# Patient Record
Sex: Male | Born: 1960
Health system: Southern US, Community
[De-identification: ages and names within clinical notes are randomized; demographics above are authoritative.]

## PROBLEM LIST (undated history)

## (undated) DIAGNOSIS — D179 Benign lipomatous neoplasm, unspecified: Secondary | ICD-10-CM

## (undated) DIAGNOSIS — R7303 Prediabetes: Secondary | ICD-10-CM

## (undated) DIAGNOSIS — I10 Essential (primary) hypertension: Secondary | ICD-10-CM

## (undated) DIAGNOSIS — S83249A Other tear of medial meniscus, current injury, unspecified knee, initial encounter: Secondary | ICD-10-CM

## (undated) DIAGNOSIS — E785 Hyperlipidemia, unspecified: Secondary | ICD-10-CM

## (undated) DIAGNOSIS — C61 Malignant neoplasm of prostate: Secondary | ICD-10-CM

## (undated) DIAGNOSIS — N401 Enlarged prostate with lower urinary tract symptoms: Secondary | ICD-10-CM

## (undated) DIAGNOSIS — C801 Malignant (primary) neoplasm, unspecified: Secondary | ICD-10-CM

## (undated) HISTORY — PX: PROSTATE BIOPSY: SHX241

## (undated) HISTORY — PX: NO PAST SURGERIES: SHX2092

## (undated) HISTORY — DX: Malignant (primary) neoplasm, unspecified: C80.1

---

## 2002-02-19 ENCOUNTER — Inpatient Hospital Stay (HOSPITAL_COMMUNITY): Admission: EM | Admit: 2002-02-19 | Discharge: 2002-02-19 | Payer: Self-pay | Admitting: Emergency Medicine

## 2003-04-17 ENCOUNTER — Emergency Department (HOSPITAL_COMMUNITY): Admission: EM | Admit: 2003-04-17 | Discharge: 2003-04-17 | Payer: Self-pay | Admitting: Emergency Medicine

## 2003-10-20 ENCOUNTER — Emergency Department (HOSPITAL_COMMUNITY): Admission: EM | Admit: 2003-10-20 | Discharge: 2003-10-20 | Payer: Self-pay | Admitting: Emergency Medicine

## 2007-07-22 ENCOUNTER — Emergency Department (HOSPITAL_COMMUNITY): Admission: EM | Admit: 2007-07-22 | Discharge: 2007-07-23 | Payer: Self-pay | Admitting: Emergency Medicine

## 2009-01-21 ENCOUNTER — Emergency Department (HOSPITAL_COMMUNITY): Admission: EM | Admit: 2009-01-21 | Discharge: 2009-01-21 | Payer: Self-pay | Admitting: Emergency Medicine

## 2009-02-27 ENCOUNTER — Emergency Department (HOSPITAL_COMMUNITY): Admission: EM | Admit: 2009-02-27 | Discharge: 2009-02-27 | Payer: Self-pay | Admitting: Emergency Medicine

## 2009-02-28 ENCOUNTER — Emergency Department (HOSPITAL_COMMUNITY): Admission: EM | Admit: 2009-02-28 | Discharge: 2009-02-28 | Payer: Self-pay | Admitting: Emergency Medicine

## 2009-04-14 ENCOUNTER — Emergency Department (HOSPITAL_COMMUNITY): Admission: EM | Admit: 2009-04-14 | Discharge: 2009-04-14 | Payer: Self-pay | Admitting: Emergency Medicine

## 2009-05-24 ENCOUNTER — Emergency Department (HOSPITAL_COMMUNITY): Admission: EM | Admit: 2009-05-24 | Discharge: 2009-05-24 | Payer: Self-pay | Admitting: Emergency Medicine

## 2009-06-01 ENCOUNTER — Emergency Department (HOSPITAL_COMMUNITY): Admission: EM | Admit: 2009-06-01 | Discharge: 2009-06-01 | Payer: Self-pay | Admitting: Emergency Medicine

## 2010-12-30 LAB — URINALYSIS, ROUTINE W REFLEX MICROSCOPIC
Hgb urine dipstick: NEGATIVE
Ketones, ur: NEGATIVE mg/dL
Nitrite: NEGATIVE
Protein, ur: NEGATIVE mg/dL
Specific Gravity, Urine: 1.022 (ref 1.005–1.030)
Urobilinogen, UA: 1 mg/dL (ref 0.0–1.0)
pH: 5.5 (ref 5.0–8.0)

## 2010-12-30 LAB — GLUCOSE, CAPILLARY

## 2010-12-30 LAB — POCT I-STAT, CHEM 8
Calcium, Ion: 1.04 mmol/L — ABNORMAL LOW (ref 1.12–1.32)
Creatinine, Ser: 1.1 mg/dL (ref 0.4–1.5)
HCT: 36 % — ABNORMAL LOW (ref 39.0–52.0)

## 2011-07-03 LAB — DIFFERENTIAL
Basophils Relative: 1
Eosinophils Absolute: 0.3
Eosinophils Relative: 3
Lymphocytes Relative: 36
Monocytes Relative: 9
Neutrophils Relative %: 51

## 2011-07-03 LAB — CBC
Hemoglobin: 14.9
MCHC: 34.8
MCV: 92.2
Platelets: 267
RBC: 4.64
RDW: 12.4
WBC: 7.9

## 2011-07-03 LAB — URINALYSIS, ROUTINE W REFLEX MICROSCOPIC
Hgb urine dipstick: NEGATIVE
Ketones, ur: NEGATIVE
Protein, ur: NEGATIVE
Specific Gravity, Urine: 1.027
Urobilinogen, UA: 0.2

## 2011-07-03 LAB — I-STAT 8, (EC8 V) (CONVERTED LAB)
BUN: 22
Chloride: 109
HCT: 46
Hemoglobin: 15.6
Operator id: 294341
Sodium: 137
TCO2: 26
pCO2, Ven: 43.3 — ABNORMAL LOW

## 2011-07-03 LAB — POCT I-STAT CREATININE
Creatinine, Ser: 1.5
Operator id: 294341

## 2011-07-03 LAB — URINE MICROSCOPIC-ADD ON

## 2015-04-26 ENCOUNTER — Encounter (HOSPITAL_COMMUNITY): Payer: Self-pay | Admitting: Cardiology

## 2015-04-26 ENCOUNTER — Emergency Department (HOSPITAL_COMMUNITY)
Admission: EM | Admit: 2015-04-26 | Discharge: 2015-04-26 | Disposition: A | Discharge status: Home / Self Care | Payer: Self-pay | Attending: Emergency Medicine | Admitting: Emergency Medicine

## 2015-04-26 DIAGNOSIS — R229 Localized swelling, mass and lump, unspecified: Secondary | ICD-10-CM

## 2015-04-26 DIAGNOSIS — Z720 Tobacco use: Secondary | ICD-10-CM | POA: Insufficient documentation

## 2015-04-26 DIAGNOSIS — R2242 Localized swelling, mass and lump, left lower limb: Secondary | ICD-10-CM | POA: Insufficient documentation

## 2015-04-26 NOTE — ED Provider Notes (Signed)
History  This chart was scribed for non-physician practitioner, Monico Blitz, PA-C,working with Evelina Bucy, MD, by Marlowe Kays, ED Scribe. This patient was seen in room TR11C/TR11C and the patient's care was started at 2:22 PM.  Chief Complaint  Patient presents with  . Abscess    The history is provided by the patient and medical records. No language interpreter was used.    HPI Comments:  Randy Hansen is a 54 y.o. male who presents to the Emergency Department complaining of a cyst to the left lateral upper thigh that has been slowly growing over the past 10 years. He states his family has these same type of cysts. He reports mild intermittent aching of the area. He denies modifying factors. He denies fever, chills, warmth, redness, nausea or vomiting.  History reviewed. No pertinent past medical history. History reviewed. No pertinent past surgical history. History reviewed. No pertinent family history. History  Substance Use Topics  . Smoking status: Current Every Day Smoker  . Smokeless tobacco: Not on file  . Alcohol Use: Yes    Review of Systems A complete 10 system review of systems was obtained and all systems are negative except as noted in the HPI and PMH.   Allergies  Review of patient's allergies indicates no known allergies.  Home Medications   Prior to Admission medications   Not on File   Triage Vitals: BP 112/67 mmHg  Pulse 48  Temp(Src) 98 F (36.7 C) (Oral)  Resp 18  Wt 107 lb 14.4 oz (48.943 kg)  SpO2 100% Physical Exam  Constitutional: He is oriented to person, place, and time. He appears well-developed and well-nourished.  HENT:  Head: Normocephalic and atraumatic.  Eyes: EOM are normal.  Neck: Normal range of motion.  Cardiovascular: Normal rate.   Pulmonary/Chest: Effort normal.  Musculoskeletal: Normal range of motion. He exhibits no tenderness.  7 x 5 cm minimally mobile, nontender lesion to left anterolateral hip. No  overlying cellulitis or warmth.  Neurological: He is alert and oriented to person, place, and time.  Skin: Skin is warm and dry.  Psychiatric: He has a normal mood and affect. His behavior is normal.  Nursing note and vitals reviewed.   ED Course  Procedures (including critical care time) DIAGNOSTIC STUDIES: Oxygen Saturation is 100% on RA, normal by my interpretation.   COORDINATION OF CARE: 2:24 PM- Will give referral to [facility]. Pt verbalizes understanding and agrees to plan.  Medications - No data to display  Labs Review Labs Reviewed - No data to display  Imaging Review No results found.   EKG Interpretation None      MDM   Final diagnoses:  Localized skin mass, lump, or swelling    Filed Vitals:   04/26/15 1328 04/26/15 1332 04/26/15 1450  BP: 146/88 112/67 148/89  Pulse: 87 48 83  Temp: 98.3 F (36.8 C) 98 F (36.7 C)   TempSrc: Oral Oral   Resp: 18 18 18   Weight:  107 lb 14.4 oz (48.943 kg)   SpO2: 96% 100% 98%    Randy Hansen is a pleasant 54 y.o. male presenting with mass to left hip, no overlying skin changes states that his family members all have similar. This does not appear to be an abscess. Patient given general surgery referral for evaluation   Evaluation does not show pathology that would require ongoing emergent intervention or inpatient treatment. Pt is hemodynamically stable and mentating appropriately. Discussed findings and plan with patient/guardian, who  agrees with care plan. All questions answered. Return precautions discussed and outpatient follow up given.    I personally performed the services described in this documentation, which was scribed in my presence. The recorded information has been reviewed and is accurate.    Monico Blitz, PA-C 04/26/15 2231  Evelina Bucy, MD 04/28/15 2232

## 2015-04-26 NOTE — ED Notes (Signed)
Pt reports a large abscess on his left hip that has been there for a couple of years. States he does not want the area I&D. Pt states he just wants to know if he can get some antibiotics.

## 2015-04-26 NOTE — Discharge Instructions (Signed)
Do not hesitate to return to the emergency room for any new, worsening or concerning symptoms. ° °Please obtain primary care using resource guide below. Let them know that you were seen in the emergency room and that they will need to obtain records for further outpatient management. ° ° ° °Emergency Department Resource Guide °1) Find a Doctor and Pay Out of Pocket °Although you won't have to find out who is covered by your insurance plan, it is a good idea to ask around and get recommendations. You will then need to call the office and see if the doctor you have chosen will accept you as a new patient and what types of options they offer for patients who are self-pay. Some doctors offer discounts or will set up payment plans for their patients who do not have insurance, but you will need to ask so you aren't surprised when you get to your appointment. ° °2) Contact Your Local Health Department °Not all health departments have doctors that can see patients for sick visits, but many do, so it is worth a call to see if yours does. If you don't know where your local health department is, you can check in your phone book. The CDC also has a tool to help you locate your state's health department, and many state websites also have listings of all of their local health departments. ° °3) Find a Walk-in Clinic °If your illness is not likely to be very severe or complicated, you may want to try a walk in clinic. These are popping up all over the country in pharmacies, drugstores, and shopping centers. They're usually staffed by nurse practitioners or physician assistants that have been trained to treat common illnesses and complaints. They're usually fairly quick and inexpensive. However, if you have serious medical issues or chronic medical problems, these are probably not your best option. ° °No Primary Care Doctor: °- Call [facility] at  832-8000 - they can help you locate a primary care doctor that  accepts your  insurance, provides certain services, etc. °- Physician Referral Service- 1-800-533-3463 ° °Chronic Pain Problems: °Organization         Address  Phone   Notes  °[facility]  (336) 297-2271 Patients need to be referred by their primary care doctor.  ° °Medication Assistance: °Organization         Address  Phone   Notes  °[place] [facility] [address] °[place], [place] [address] (336) 641-8030 --Must be a resident of [place] °-- Must have NO insurance coverage whatsoever (no Medicaid/ Medicare, etc.) °-- The pt. MUST have a primary care doctor that directs their care regularly and follows them in the community °  °[facility]  (866) 331-1348   °[employer]  (888) 892-1162   ° °Agencies that provide inexpensive medical care: °Organization         Address  Phone   Notes  °[facility]  (336) 832-8035   °[facility]    (336) 832-7272   °[facility] [address] °[place], [place] [address] (336) 832-4777   °[facility] [address], °[place] (336) 271-4999   °[facility]    (336) 373-0678   °[facility]    (336) 272-1050   °[facility] ° [address], [place] Phone:  (336) 832-4444, Fax:  (336) 832-4440 Hours of Operation:  9 am -   6 pm, M-F.  Also accepts Medicaid/Medicare and self-pay.  °[facility] ° [address], [place] Phone: (336) 832-3150, Fax: (336) 832-3151. Hours of Operation:  8:30 am - 5:30 pm, M-F.  Also accepts Medicaid and self-pay.  °[facility] [place] [address], [place] Phone: (336) 878-6027   °[facility] [address], [place], [place] (336)723-1848, Ext. 123 Mondays & Thursdays: 7-9 AM.  First 15 patients are seen on a first come, first serve basis. °  ° °Medicaid-accepting [place] Providers: ° °Organization          Address  Phone   Notes  °[facility] [address], [place] (336) 641-2100 Also accepts self-pay patients.  °[facility] [address], [place] ° (336) 856-9996   °[facility] [address], [place] (336) 288-8857   °[facility] [address] [place] [address], [place] (336) 299-7000   °Veita Bland [address], [place]  ° (336) 373-1557 Only accepts  Access Medicaid patients after they have their name applied to their card.  ° °Self-Pay (no insurance) in [place]: ° °Organization         Address  Phone   Notes  °Sickle Cell Patients, [facility] [address], [place] (336) 832-1970   °[facility] [address], [place] (336) 832-4400   °[facility] ° [address], [place] (336) 992-4800   °[facility]/Dr. Osei-Bonsu ° [address] [place] [address], [place] or [address], [place] (336) 841-8500 Phone number for both [place] and [place] locations is the same.  °[facility] [address], [place] (336) 299-0000   °[facility] [address] [place] [address], [place] or [address] (336) 852-7530 °(336) 878-2260   °[facility] [address], [place] (336) 350-1642, phone; (336) 294-5005, fax Sees patients 1st and 3rd Saturday of every month.  Must not qualify for public or private insurance (i.e. Medicaid, Medicare, [place] Health Choice, Veterans' Benefits) • Household income should be no more than 200% of the poverty level •The clinic cannot treat you if you are pregnant or think you are pregnant • Sexually transmitted diseases are not treated at the clinic.  ° ° °Dental Care: °Organization         Address  Phone  Notes  °[facility] [address],  [place] (336) 641-6152 Accepts children up to age 21 who are enrolled in Medicaid or [place] Health Choice; pregnant women with a Medicaid card; and children who have applied for Medicaid or [place] Health Choice, but were declined, whose parents can pay a reduced fee at time of service.  °[facility] [place]  [address], [place] (336) 641-7733 Accepts children up to age 21 who are enrolled in Medicaid or [place] Health Choice; pregnant women with a Medicaid card; and children who have applied for Medicaid or [place] Health Choice, but were declined, whose parents can pay a reduced fee at time of service.  °[facility] ° [address], [place] (336) 641-4533 Patients are seen by appointment only. Walk-ins are not accepted. [facility] will see patients 18 years of age and   older. °Monday - Tuesday (8am-5pm) °Most Wednesdays (8:30-5pm) °$30 per visit, cash only  °[facility] ° [address], [place] (336) 641-4533 Patients are seen by appointment only. Walk-ins are not accepted. [facility] will see patients 18 years of age and older. °One Wednesday Evening (Monthly: Volunteer Based).  $30 per visit, cash only  °[facility]  (919) 537-3737 for adults; Children under age 4, call Graduate Pediatric Dentistry at (919) [phone]. Children aged 4-14, please call (919) [phone] to request a pediatric application. ° Dental services are provided in all areas of dental care including fillings, crowns and bridges, complete and partial dentures, implants, gum treatment, root canals, and extractions. Preventive care is also provided. Treatment is provided to both adults and children. °Patients are selected via a lottery and there is often a waiting list. °  °[facility] [address], °[place] ° (336) 763-8833 [email or web address] °  °[facility] [address], [place], [place]  (336)723-1848, Ext. 123 Second and Fourth Thursday of each month, opens at 6:30 AM; Clinic ends at 9 AM.  Patients are seen on a first-come first-served basis, and a limited number are seen during each clinic.  ° °[facility] ° [address], [place], [place] (336) 723-7904   Eligibility Requirements °You must have lived in [place], [place], or [place] counties for at least the last three months. °  You cannot be eligible for state or federal sponsored healthcare insurance, including Veterans Administration, Medicaid, or Medicare. °  You generally cannot be eligible for healthcare insurance through your employer.  °  How to apply: °Eligibility screenings are held every Tuesday and Wednesday afternoon from 1:00 pm until 4:00 pm. You do not need an appointment for the interview!  °[facility] [address], [place], [place] 336-631-2330   °[facility]  336-342-8273   °[facility]  336-703-3100   °[facility]  336-570-6415   ° °Behavioral Health Resources in the Community: °Intensive Outpatient Programs °Organization         Address  Phone  Notes  °[facility] [address], [place], [place] 336-878-6098   °[facility] [address], [place], [place] 336-832-9800   °[facility] [address], [place], [place] ° 336-882-2125   °[facility] [address],  °[place], [place] 1-800-853-5163 or 336-641-4981   °Substance Abuse Resources °Organization         Address  Phone  Notes  °[facility]  336-882-2125   °[facility]  336-784-9470   °[facility]  336-285-9073   °[facility]  336-845-3988   °Residential & Outpatient Substance Abuse Program  1-800-659-3381   °Psychological Services °Organization         Address  Phone  Notes  °[facility]  336- 832-9600   °[facility]  336- 378-7881    °[facility] [address], [place] 1-800-853-5163 or 336-641-4981   ° °Mobile Crisis Teams °Organization         Address  Phone  Notes  °[facility], Mobile Crisis Care Unit  1-877-626-1772   °Assertive °[facility] ° [address] [place], [place] 336-834-9664   °Sharon DeEsch [address] °[place] 336-554-5454   ° °Self-Help/Support Groups °Organization         Address    Phone             Notes  °[facility] - variety of support groups  336- 373-1402 Call for more information  °Narcotics Anonymous (NA), [facility] [address], °[place]  2 meetings at this location  ° °Residential Treatment Programs °Organization         Address  Phone  Notes  °[facility] [address],    °[place]  1-866-801-8205   °[facility] ° [address], [place], [place] 704-293-8524   °[facility] [address], [place] 336-845-3988 Admissions: 8am-3pm M-F  °[facility] [address]    °[place], [place] 336-841-1104   °[facility] [address], [place], [place] 336-379-7146   °[facility] [address]  °[place], [place] 336-285-9073   °[facility] - Intensive Outpatient [address], [place], [place] 336-852-3033   °[facility] [address]  °[place], [place] 1-877-615-2722 or 336-784-9470   °[facility] [address] [place], [place] 336-227-7417 Accepts Medicaid  °[facility] [address]  °[place] 1-800-659-3381 Substance Abuse/Addiction Treatment  ° °[place] Behavioral Health Resources °Organization         Address  Phone  Notes  °[facility]  (888) 581-9988   °Julie Brannon, PhD [address] [place], [place]   (336) 349-5553 or (336) 951-0000   °[facility]   [address] °[place], [place] (336) 349-4454   °[facility] [address], [place], [place] (336) 342-8316 Insurance/Medicaid/sponsorship through [facility]  °[facility] [address]                                    [place], [place] (336) 342-8316 Therapy/tele-psych/case  °[facility] [address]  ° [place], [place] (336) 349-2233    °Dr. Arfeen  (336) 349-4544   °[facility] [place]  [employer] [place] [facility] 1) [address], [place] °2) [address], [place] °3)  [address], [place] (336) 349-3220 °(336) 342-7768 ° °(336) 342-8140   °[place] [facility] (336) 342-1394 or (336) 342-3537 (After Hours)    ° ° ° °

## 2015-06-05 ENCOUNTER — Encounter (HOSPITAL_COMMUNITY): Payer: Self-pay | Admitting: *Deleted

## 2015-06-05 ENCOUNTER — Emergency Department (HOSPITAL_COMMUNITY)
Admission: EM | Admit: 2015-06-05 | Discharge: 2015-06-05 | Disposition: A | Discharge status: Home / Self Care | Payer: 59 | Attending: Emergency Medicine | Admitting: Emergency Medicine

## 2015-06-05 DIAGNOSIS — R0989 Other specified symptoms and signs involving the circulatory and respiratory systems: Secondary | ICD-10-CM

## 2015-06-05 DIAGNOSIS — R222 Localized swelling, mass and lump, trunk: Secondary | ICD-10-CM | POA: Insufficient documentation

## 2015-06-05 DIAGNOSIS — R0683 Snoring: Secondary | ICD-10-CM | POA: Diagnosis not present

## 2015-06-05 DIAGNOSIS — R202 Paresthesia of skin: Secondary | ICD-10-CM | POA: Diagnosis not present

## 2015-06-05 DIAGNOSIS — Z720 Tobacco use: Secondary | ICD-10-CM | POA: Diagnosis not present

## 2015-06-05 NOTE — ED Provider Notes (Signed)
CSN: [id number]     Arrival date & time 06/05/15  1353 History   First MD Initiated Contact with Patient 06/05/15 1924     Chief Complaint  Patient presents with  . Mass     (Consider location/radiation/quality/duration/timing/severity/associated sxs/prior Treatment) HPI Patient's initial complaint is that he was looking in the mirror last week and he stretched out his arms and his chest and he noticed a knot in the middle of his chest. He never seen that before. He is worried about it all week. (His area of concern is his xiphoid process.) No associated symptoms.  Subsequently patient reported that a lot of people's that he snores a lot when he sleeps and he seems like he is struggling for air. He wonders if that is normal or if he needs to have something else done about it. (The hand or the room the patient was sound asleep and snoring, he was on the saturation monitor and O2 saturation was 96%)  Patient went on to express concerns about right arm. He reports sometimes he thought it felt like it was tingling but not quite tingling. He states because he is 9 it concerned him. No other associated symptoms. History reviewed. No pertinent past medical history. History reviewed. No pertinent past surgical history. History reviewed. No pertinent family history. Social History  Substance Use Topics  . Smoking status: Current Every Day Smoker  . Smokeless tobacco: None  . Alcohol Use: Yes    Review of Systems  10 Systems reviewed and are negative for acute change except as noted in the HPI.   Allergies  Review of patient's allergies indicates no known allergies.  Home Medications   Prior to Admission medications   Not on File   BP 132/77 mmHg  Pulse 77  Temp(Src) 98 F (36.7 C) (Oral)  Resp 18  SpO2 95% Physical Exam  Constitutional: He is oriented to person, place, and time. He appears well-developed and well-nourished.  HENT:  Head: Normocephalic and atraumatic.  Eyes:  EOM are normal. Pupils are equal, round, and reactive to light.  Neck: Neck supple.  Cardiovascular: Normal rate, regular rhythm, normal heart sounds and intact distal pulses.   Pulmonary/Chest: Effort normal and breath sounds normal. He exhibits no tenderness.  Normal chest wall with normal xiphoid process.  Abdominal: Soft. Bowel sounds are normal. He exhibits no distension. There is no tenderness.  Musculoskeletal: Normal range of motion. He exhibits no edema or tenderness.  Neurological: He is alert and oriented to person, place, and time. He has normal strength. No cranial nerve deficit. He exhibits normal muscle tone. Coordination normal. GCS eye subscore is 4. GCS verbal subscore is 5. GCS motor subscore is 6.  Skin: Skin is warm, dry and intact.  Psychiatric: He has a normal mood and affect.    ED Course  Procedures (including critical care time) Labs Review Labs Reviewed - No data to display  Imaging Review No results found. I have personally reviewed and evaluated these images and lab results as part of my medical decision-making.   EKG Interpretation None      MDM   Final diagnoses:  Primary snoring  Paresthesia  Chest wall symptom   Patient had multiple concerns. He is well in appearance with normal vital signs. No evidence of acute illness or emergent condition. Patient is counseled on appropriate outpatient follow-up.    Charlesetta Shanks, MD 06/05/15 2049

## 2015-06-05 NOTE — Discharge Instructions (Signed)
Possible Sleep Apnea  Sleep apnea is a sleep disorder characterized by abnormal pauses in breathing while you sleep. When your breathing pauses, the level of oxygen in your blood decreases. This causes you to move out of deep sleep and into light sleep. As a result, your quality of sleep is poor, and the system that carries your blood throughout your body (cardiovascular system) experiences stress. If sleep apnea remains untreated, the following conditions can develop:  High blood pressure (hypertension).  Coronary artery disease.  Inability to achieve or maintain an erection (impotence).  Impairment of your thought process (cognitive dysfunction). There are three types of sleep apnea:  Obstructive sleep apnea--Pauses in breathing during sleep because of a blocked airway.  Central sleep apnea--Pauses in breathing during sleep because the area of the brain that controls your breathing does not send the correct signals to the muscles that control breathing.  Mixed sleep apnea--A combination of both obstructive and central sleep apnea. RISK FACTORS The following risk factors can increase your risk of developing sleep apnea:  Being overweight.  Smoking.  Having narrow passages in your nose and throat.  Being of older age.  Being male.  Alcohol use.  Sedative and tranquilizer use.  Ethnicity. Among individuals younger than 35 years, African Americans are at increased risk of sleep apnea. SYMPTOMS   Difficulty staying asleep.  Daytime sleepiness and fatigue.  Loss of energy.  Irritability.  Loud, heavy snoring.  Morning headaches.  Trouble concentrating.  Forgetfulness.  Decreased interest in sex. DIAGNOSIS  In order to diagnose sleep apnea, your caregiver will perform a physical examination. Your caregiver may suggest that you take a home sleep test. Your caregiver may also recommend that you spend the night in a sleep lab. In the sleep lab, several monitors record  information about your heart, lungs, and brain while you sleep. Your leg and arm movements and blood oxygen level are also recorded. TREATMENT The following actions may help to resolve mild sleep apnea:  Sleeping on your side.   Using a decongestant if you have nasal congestion.   Avoiding the use of depressants, including alcohol, sedatives, and narcotics.   Losing weight and modifying your diet if you are overweight. There also are devices and treatments to help open your airway:  Oral appliances. These are custom-made mouthpieces that shift your lower jaw forward and slightly open your bite. This opens your airway.  Devices that create positive airway pressure. This positive pressure "splints" your airway open to help you breathe better during sleep. The following devices create positive airway pressure:  Continuous positive airway pressure (CPAP) device. The CPAP device creates a continuous level of air pressure with an air pump. The air is delivered to your airway through a mask while you sleep. This continuous pressure keeps your airway open.  Nasal expiratory positive airway pressure (EPAP) device. The EPAP device creates positive air pressure as you exhale. The device consists of single-use valves, which are inserted into each nostril and held in place by adhesive. The valves create very little resistance when you inhale but create much more resistance when you exhale. That increased resistance creates the positive airway pressure. This positive pressure while you exhale keeps your airway open, making it easier to breath when you inhale again.  Bilevel positive airway pressure (BPAP) device. The BPAP device is used mainly in patients with central sleep apnea. This device is similar to the CPAP device because it also uses an air pump to deliver continuous air  pressure through a mask. However, with the BPAP machine, the pressure is set at two different levels. The pressure when you  exhale is lower than the pressure when you inhale.  Surgery. Typically, surgery is only done if you cannot comply with less invasive treatments or if the less invasive treatments do not improve your condition. Surgery involves removing excess tissue in your airway to create a wider passage way. Document Released: 08/30/2002 Document Revised: 01/04/2013 Document Reviewed: 01/16/2012 [facility] Patient Information 2015 [place], [place]. This information is not intended to replace advice given to you by your health care provider. Make sure you discuss any questions you have with your health care provider.  Paresthesia Paresthesia is an abnormal burning or prickling sensation. This sensation is generally felt in the hands, arms, legs, or feet. However, it may occur in any part of the body. It is usually not painful. The feeling may be described as:  Tingling or numbness.  "Pins and needles."  Skin crawling.  Buzzing.  Limbs "falling asleep."  Itching. Most people experience temporary (transient) paresthesia at some time in their lives. CAUSES  Paresthesia may occur when you breathe too quickly (hyperventilation). It can also occur without any apparent cause. Commonly, paresthesia occurs when pressure is placed on a nerve. The feeling quickly goes away once the pressure is removed. For some people, however, paresthesia is a long-lasting (chronic) condition caused by an underlying disorder. The underlying disorder may be:  A traumatic, direct injury to nerves. Examples include a:  Broken (fractured) neck.  Fractured skull.  A disorder affecting the brain and spinal cord (central nervous system). Examples include:  Transverse myelitis.  Encephalitis.  Transient ischemic attack.  Multiple sclerosis.  Stroke.  Tumor or blood vessel problems, such as an arteriovenous malformation pressing against the brain or spinal cord.  A condition that damages the peripheral nerves (peripheral  neuropathy). Peripheral nerves are not part of the brain and spinal cord. These conditions include:  Diabetes.  Peripheral vascular disease.  Nerve entrapment syndromes, such as carpal tunnel syndrome.  Shingles.  Hypothyroidism.  Vitamin B12 deficiencies.  Alcoholism.  Heavy metal poisoning (lead, arsenic).  Rheumatoid arthritis.  Systemic lupus erythematosus. DIAGNOSIS  Your caregiver will attempt to find the underlying cause of your paresthesia. Your caregiver may:  Take your medical history.  Perform a physical exam.  Order various lab tests.  Order imaging tests. TREATMENT  Treatment for paresthesia depends on the underlying cause. HOME CARE INSTRUCTIONS  Avoid drinking alcohol.  You may consider massage or acupuncture to help relieve your symptoms.  Keep all follow-up appointments as directed by your caregiver. SEEK IMMEDIATE MEDICAL CARE IF:   You feel weak.  You have trouble walking or moving.  You have problems with speech or vision.  You feel confused.  You cannot control your bladder or bowel movements.  You feel numbness after an injury.  You faint.  Your burning or prickling feeling gets worse when walking.  You have pain, cramps, or dizziness.  You develop a rash. MAKE SURE YOU:  Understand these instructions.  Will watch your condition.  Will get help right away if you are not doing well or get worse. Document Released: 08/30/2002 Document Revised: 12/02/2011 Document Reviewed: 05/31/2011 [facility] Patient Information 2015 [place], [place]. This information is not intended to replace advice given to you by your health care provider. Make sure you discuss any questions you have with your health care provider.   Emergency Department Resource Guide 1) Find a Doctor  and Pay Out of Pocket Although you won't have to find out who is covered by your insurance plan, it is a good idea to ask around and get recommendations. You will then  need to call the office and see if the doctor you have chosen will accept you as a new patient and what types of options they offer for patients who are self-pay. Some doctors offer discounts or will set up payment plans for their patients who do not have insurance, but you will need to ask so you aren't surprised when you get to your appointment.  2) Contact Your Local Health Department Not all health departments have doctors that can see patients for sick visits, but many do, so it is worth a call to see if yours does. If you don't know where your local health department is, you can check in your phone book. The CDC also has a tool to help you locate your state's health department, and many state websites also have listings of all of their local health departments.  3) Find a [facility] If your illness is not likely to be very severe or complicated, you may want to try a walk in clinic. These are popping up all over the country in pharmacies, drugstores, and shopping centers. They're usually staffed by nurse practitioners or physician assistants that have been trained to treat common illnesses and complaints. They're usually fairly quick and inexpensive. However, if you have serious medical issues or chronic medical problems, these are probably not your best option.  No Primary Care Doctor: - Call Health Connect at  [phone] - they can help you locate a primary care doctor that  accepts your insurance, provides certain services, etc. - Physician Referral Service- [phone]  Chronic Pain Problems: Organization         Address  Phone   Notes  [facility]  [phone] Patients need to be referred by their primary care doctor.   Medication Assistance: Organization         Address  Phone   Notes  [facility] Medication [facility] [address] [place], [place] [address] [phone] --Must be a resident of [facility] -- Must have NO  insurance coverage whatsoever (no Medicaid/ Medicare, etc.) -- The pt. MUST have a primary care doctor that directs their care regularly and follows them in the community   MedAssist  [phone]   [employer]  [phone]    Agencies that provide inexpensive medical care: Organization         Address  Phone   Notes  [place]  [phone]   [facility]    [phone]   [place], [place] [address] [phone]   [place] [address], [place] [phone]   Planned Parenthood    [phone]   [facility]    [phone]   [place] and [address], [place] Phone:  [phone], Fax:  [phone] Hours of Operation:  9 am - 6 pm, M-F.  Also accepts Medicaid/Medicare and self-pay.  [facility] for [place], [address], [place] Phone: [phone], Fax: [phone]. Hours of Operation:  8:30 am - 5:30 pm, M-F.  Also accepts Medicaid and self-pay.  [facility] [address] [employer] Phone: [phone]  [facility] [place], [place], [place] [phone], Ext. [phone] Mondays & Thursdays: 7-9 AM.  First 15 patients are seen on a first come, first serve basis.    [place] Providers:  Organization         Address  Phone   Notes  [facility] [address], [place] [phone] Also accepts self-pay patients.  [facility] [address], [place]  [phone]   [place], [address], [place] [phone]   [facility] [address] [place] [phone]   Lucianne Lei [address], [place]   [phone] Only accepts [place] Access [place] patients after they have  their name applied to their card.   Self-Pay (no insurance) in [facility]:  Organization         Address  Phone   Notes  Sickle Cell Patients, [facility] [place] [phone]   [facility] [place] [phone]   [facility] [place]  [place], [place], [place] [phone]   [facility]/Dr. Osei-Bonsu  [address] [place] or [address], [place] [phone] Phone number for both [place] and [place] locations is the same.  [facility] [address], [place] [phone]   [facility] [address], [place] or [address] [phone]   [facility] [address], [place] [phone], phone; [phone], fax Sees patients 1st and 3rd Saturday of every month.  Must not qualify for public or private insurance (i.e. Medicaid, Medicare, [place] Health Choice, Veterans' Benefits)  Household income should be no more than 200% of the poverty level The clinic cannot treat you if you are pregnant or think you are pregnant  Sexually transmitted diseases are not treated at the clinic.    Dental Care: Organization         Address  Phone  Notes  [facility] Department of [facility] [place] [phone] Accepts children up to age 43 who are enrolled in [place] or [place]; pregnant women with a Medicaid card; and children who have applied for Medicaid or [place] Health Choice, but were declined, whose parents can pay a reduced fee at time of service.  [facility] Department of [facility]  [address], [place] [phone] Accepts children up to age 48 who are enrolled in [place] or [place]; pregnant women with a Medicaid card; and children who have applied  for Medicaid or [place] Health Choice, but were declined, whose parents can pay a reduced fee at time of service.  [place] Adult Dental Access PROGRAM  [place] [phone] Patients are seen by appointment only. Walk-ins are not accepted. [place] will see patients 13 years of age and older. Monday - Tuesday (8am-5pm) Most Wednesdays (8:30-5pm) $30 per visit, cash only  [facility] Adult Dental Access PROGRAM  [address], [facility] [phone] Patients are seen by appointment only. Walk-ins are not accepted. [place] will see patients 71 years of age and older. One Wednesday Evening (Monthly: Volunteer Based).  $30 per visit,  cash only  [place]  [phone] for adults; Children under age 58, call Graduate Pediatric Dentistry at [phone]. Children aged 95-14, please call [phone] to request a pediatric application.  Dental services are provided in all areas of dental care including fillings, crowns and bridges, complete and partial dentures, implants, gum treatment, root canals, and extractions. Preventive care is also provided. Treatment is provided to both adults and children. Patients are selected via a lottery and there is often a waiting list.   [facility] [address] [place]  [phone] [email or web address]   [facility] [address] [place], [place] [phone], Ext. [phone] Second and Fourth Thursday of each month, opens at 6:30 AM; Clinic ends at 9 AM.  Patients are seen on a first-come first-served basis, and a limited number are seen during each clinic.   [facility]  [address] Hillard Danker [place], [place] [phone]   Eligibility Requirements You must have lived in [place], [place], or [place] counties for at least the last three months.   You cannot be eligible for state or federal sponsored [employer], including [employer],  [place], or [employer].   You generally cannot be eligible for healthcare insurance through your employer.    How to apply: Eligibility screenings are held every Tuesday and Wednesday afternoon from 1:00 pm until 4:00 pm. You do not need an appointment for the interview!  [facility] [address] [place], [place]   [place]  [place] Department  [place]  [phone]    Behavioral Health Resources in the Community: Intensive Outpatient Programs Organization         Address  Phone  Notes  [place]. [address] [place], [place] [phone]   [facility] Outpatient [address] [place], [place]   ADS: Alcohol & Drug Svcs [address] [place], [place]   [place] [address],  [place], [place] or [phone]   Substance Abuse Resources Organization         Address  Phone  Notes  Alcohol and Drug Services  [phone]   [place]  [phone]   [facility] [place]   Chinita Pester  [phone]   Residential & Outpatient Substance Abuse Program  [phone]   Psychological Services Organization         Address  Phone  Notes  [facility]  [place]  [phone]   [place] [address], [place] or [phone]    Mobile Crisis Teams Organization         Address  Phone  Notes  [facility], Mobile Crisis Care Unit  [phone]   [facility] Services  [address] [place], [place]   [place] [address] [place] [phone]    Self-Help/Support Groups Organization         Address  Phone             Notes  [place]. of [place] - variety of support  groups  [place] Call for more information  Narcotics Anonymous (NA), [facility] [address], [employer]  2 meetings at this location   Special educational needs teacher  Address  Phone  Notes  [facility] [address]    [place]  [phone]   [facility]  [address] [place] [address] [place] [phone], [place], [place]   [place], [place] [phone] Admissions: 8am-3pm M-F  Incentives Substance [place] [address]    [place], [place] [phone]   [facility] [address] [place], [place], [place]   [facility] [address]  [place], [place]   [facility] Programs - Intensive Outpatient [address] Kristeen [address], [place], [place]   [facility].,  [place], [place] [phone] or [phone]   [facility] (RTS) [address] [place], [place] Accepts Medicaid  [facility] [place] [address]  [place] [phone] Substance Abuse/Addiction Treatment   [facility] Organization         Address  Phone  Notes  [facility]  [phone]   Domenic Schwab, PhD [address] Arlis Porta [place], [place]   [phone] or [phone]   [place], [place] [phone]   [facility] [address] [place], [place] [phone] Insurance/Medicaid/sponsorship through [facility] and Families [address]                                    [place], [place] [phone] [place] [address], [place] [phone]    Dr. Adele Schilder  [phone]   [facility] Dept. 1) [address] [place] 2) [place] 3)   [place] [address], [place] [phone]  [phone]   [place] [phone] or [phone] (After Hours)

## 2015-06-05 NOTE — ED Notes (Addendum)
Multiple complaints. Pt reports having mass to upper abd x 1 week, mild pain with palpation. Having tingling sensation to right fingers for days. No acute distress noted at triage.

## 2015-07-06 ENCOUNTER — Emergency Department (INDEPENDENT_AMBULATORY_CARE_PROVIDER_SITE_OTHER)
Admission: EM | Admit: 2015-07-06 | Discharge: 2015-07-06 | Disposition: A | Discharge status: Home / Self Care | Payer: 59 | Source: Home / Self Care | Attending: Emergency Medicine | Admitting: Emergency Medicine

## 2015-07-06 ENCOUNTER — Encounter (HOSPITAL_COMMUNITY): Payer: Self-pay | Admitting: Emergency Medicine

## 2015-07-06 DIAGNOSIS — T63891A Toxic effect of contact with other venomous animals, accidental (unintentional), initial encounter: Secondary | ICD-10-CM

## 2015-07-06 DIAGNOSIS — T63481A Toxic effect of venom of other arthropod, accidental (unintentional), initial encounter: Secondary | ICD-10-CM

## 2015-07-06 DIAGNOSIS — M25561 Pain in right knee: Secondary | ICD-10-CM | POA: Diagnosis not present

## 2015-07-06 DIAGNOSIS — D171 Benign lipomatous neoplasm of skin and subcutaneous tissue of trunk: Secondary | ICD-10-CM

## 2015-07-06 MED ORDER — HYDROCODONE-ACETAMINOPHEN 5-325 MG PO TABS: 1.0000 | ORAL_TABLET | Freq: Four times a day (QID) | ORAL | Status: DC | PRN

## 2015-07-06 MED ORDER — MELOXICAM 15 MG PO TABS: 15.0000 mg | ORAL_TABLET | Freq: Every day | ORAL | Status: DC

## 2015-07-06 NOTE — Discharge Instructions (Signed)
I do not see any stinger. Apply ice to your eyebrow to help with the pain and swelling.  The knot on your chest is likely a lipoma.  If it becomes larger, red, painful, please follow-up here or the emergency room.  Your knee pain is likely coming from some arthritis given your job. Take meloxicam daily. Apply ice at the end of the day. Use Norco every 4-6 hours as needed for severe pain. Do not take this medicine while driving. If her knee continues to bother you, please follow-up with orthopedics.  Please call Maggy tomorrow for assistance in finding a primary care doctor.

## 2015-07-06 NOTE — ED Notes (Signed)
C/o bee sting on left eye States he has right knee pain with no injury States he has a knot on chest

## 2015-07-06 NOTE — ED Provider Notes (Signed)
CSN: [id number]     Arrival date & time 07/06/15  1817 History   First MD Initiated Contact with Patient 07/06/15 1828     Chief Complaint  Patient presents with  . Knee Pain  . Insect Bite   (Consider location/radiation/quality/duration/timing/severity/associated sxs/prior Treatment) HPI  He is a 54 year old man here for evaluation of several concerns.  He reports being stunned by an insect yesterday in his left lateral eyebrow. He reports pain and swelling. He denies any eye complaints. He thinks it may have been a yellow jacket, but is not sure.  He also reports a nontender nodule over his distal sternum. It is only visible if he puffs out his chest. He noticed it several weeks ago. It has not changed.  Lastly, he reports intermittent right knee pain. He reports an injury to it several years ago. He was supposed to follow-up with orthopedic doctor, but never did. He states he will intermittently have pain, primarily posteriorly. He also gets an intermittent swelling of the knee. He works in [employer]. He does occasionally pop, but this typically relieves his pain. No locking symptoms. No joint instability.  History reviewed. No pertinent past medical history. History reviewed. No pertinent past surgical history. History reviewed. No pertinent family history. Social History  Substance Use Topics  . Smoking status: Current Every Day Smoker  . Smokeless tobacco: None  . Alcohol Use: Yes    Review of Systems As in history of present illness Allergies  Review of patient's allergies indicates no known allergies.  Home Medications   Prior to Admission medications   Medication Sig Start Date End Date Taking? Authorizing Provider  HYDROcodone-acetaminophen (NORCO) 5-325 MG tablet Take 1 tablet by mouth every 6 (six) hours as needed for moderate pain. 07/06/15   Melony Overly, MD  meloxicam (MOBIC) 15 MG tablet Take 1 tablet (15 mg total) by mouth daily. 07/06/15   Melony Overly, MD    Meds Ordered and Administered this Visit  Medications - No data to display  BP 142/77 mmHg  Pulse 86  Temp(Src) 98.7 F (37.1 C) (Oral)  Resp 16  SpO2 98% No data found.   Physical Exam  Constitutional: He appears well-developed and well-nourished. No distress.  HENT:  Mild swelling of the left lateral eyebrow. No stinger seen.  Neck: Neck supple.  Cardiovascular: Normal rate.   Pulmonary/Chest: Effort normal.  Musculoskeletal:  Right knee: No erythema or edema. He does have a small joint effusion. No point tenderness. No joint laxity.  Skin:  3 cm soft nontender nodule over his xiphoid process area did    ED Course  Procedures (including critical care time)  Labs Review Labs Reviewed - No data to display  Imaging Review No results found.    MDM   1. Insect sting, accidental or unintentional, initial encounter   2. Lipoma of torso   3. Right knee pain    Recommended ice for insect sting. Watchful waiting first lipoma. Meloxicam and ice for knee pain. Prescription for Norco, #15 tablets given to use as needed for severe pain. Follow-up with orthopedics if this is persistent. Gave him Maggy's card to call about financial assistance.     Melony Overly, MD 07/06/15 [phone]

## 2015-08-05 ENCOUNTER — Encounter (HOSPITAL_COMMUNITY): Payer: Self-pay

## 2015-08-05 ENCOUNTER — Emergency Department (HOSPITAL_COMMUNITY)
Admission: EM | Admit: 2015-08-05 | Discharge: 2015-08-05 | Disposition: A | Discharge status: Home / Self Care | Payer: 59 | Attending: Emergency Medicine | Admitting: Emergency Medicine

## 2015-08-05 DIAGNOSIS — M25561 Pain in right knee: Secondary | ICD-10-CM | POA: Insufficient documentation

## 2015-08-05 DIAGNOSIS — G8929 Other chronic pain: Secondary | ICD-10-CM | POA: Insufficient documentation

## 2015-08-05 DIAGNOSIS — Z720 Tobacco use: Secondary | ICD-10-CM | POA: Insufficient documentation

## 2015-08-05 DIAGNOSIS — R05 Cough: Secondary | ICD-10-CM | POA: Insufficient documentation

## 2015-08-05 MED ORDER — MELOXICAM 7.5 MG PO TABS: 7.5000 mg | ORAL_TABLET | Freq: Every day | ORAL | Status: DC

## 2015-08-05 NOTE — Discharge Instructions (Signed)
Wear knee sleeve as needed for comfort. Ice and elevate knee throughout the day, alternate between ice and heat. Use mobic as directed. Use tylenol as needed for additional relief. Call orthopedic follow up today or tomorrow to schedule followup appointment for recheck of ongoing knee pain in one to two weeks. Return to the ER for changes or worsening symptoms.   Knee Pain Knee pain is a very common symptom and can have many causes. Knee pain often goes away when you follow your health care provider's instructions for relieving pain and discomfort at home. However, knee pain can develop into a condition that needs treatment. Some conditions may include:  Arthritis caused by wear and tear (osteoarthritis).  Arthritis caused by swelling and irritation (rheumatoid arthritis or gout).  A cyst or growth in your knee.  An infection in your knee joint.  An injury that will not heal.  Damage, swelling, or irritation of the tissues that support your knee (torn ligaments or tendinitis). If your knee pain continues, additional tests may be ordered to diagnose your condition. Tests may include X-rays or other imaging studies of your knee. You may also need to have fluid removed from your knee. Treatment for ongoing knee pain depends on the cause, but treatment may include:  Medicines to relieve pain or swelling.  Steroid injections in your knee.  Physical therapy.  Surgery. HOME CARE INSTRUCTIONS  Take medicines only as directed by your health care provider.  Rest your knee and keep it raised (elevated) while you are resting.  Do not do things that cause or worsen pain.  Avoid high-impact activities or exercises, such as running, jumping rope, or doing jumping jacks.  Apply ice to the knee area:  Put ice in a plastic bag.  Place a towel between your skin and the bag.  Leave the ice on for 20 minutes, 2-3 times a day.  Ask your health care provider if you should wear an elastic knee  support.  Keep a pillow under your knee when you sleep.  Lose weight if you are overweight. Extra weight can put pressure on your knee.  Do not use any tobacco products, including cigarettes, chewing tobacco, or electronic cigarettes. If you need help quitting, ask your health care provider. Smoking may slow the healing of any bone and joint problems that you may have. SEEK MEDICAL CARE IF:  Your knee pain continues, changes, or gets worse.  You have a fever along with knee pain.  Your knee buckles or locks up.  Your knee becomes more swollen. SEEK IMMEDIATE MEDICAL CARE IF:   Your knee joint feels hot to the touch.  You have chest pain or trouble breathing.   This information is not intended to replace advice given to you by your health care provider. Make sure you discuss any questions you have with your health care provider.   Document Released: 07/07/2007 Document Revised: 09/30/2014 Document Reviewed: 04/25/2014 Elsevier Interactive Patient Education 2016 [place] therapy can help ease sore, stiff, injured, and tight muscles and joints. Heat relaxes your muscles, which may help ease your pain.  RISKS AND COMPLICATIONS If you have any of the following conditions, do not use heat therapy unless your health care provider has approved:  Poor circulation.  Healing wounds or scarred skin in the area being treated.  Diabetes, heart disease, or high blood pressure.  Not being able to feel (numbness) the area being treated.  Unusual swelling of the area being  treated.  Active infections.  Blood clots.  Cancer.  Inability to communicate pain. This may include young children and people who have problems with their brain function (dementia).  Pregnancy. Heat therapy should only be used on old, pre-existing, or long-lasting (chronic) injuries. Do not use heat therapy on new injuries unless directed by your health care provider. HOW TO USE HEAT  THERAPY There are several different kinds of heat therapy, including:  Moist heat pack.  Warm water bath.  Hot water bottle.  Electric heating pad.  Heated gel pack.  Heated wrap.  Electric heating pad. Use the heat therapy method suggested by your health care provider. Follow your health care provider's instructions on when and how to use heat therapy. GENERAL HEAT THERAPY RECOMMENDATIONS  Do not sleep while using heat therapy. Only use heat therapy while you are awake.  Your skin may turn pink while using heat therapy. Do not use heat therapy if your skin turns red.  Do not use heat therapy if you have new pain.  High heat or long exposure to heat can cause burns. Be careful when using heat therapy to avoid burning your skin.  Do not use heat therapy on areas of your skin that are already irritated, such as with a rash or sunburn. SEEK MEDICAL CARE IF:  You have blisters, redness, swelling, or numbness.  You have new pain.  Your pain is worse. MAKE SURE YOU:  Understand these instructions.  Will watch your condition.  Will get help right away if you are not doing well or get worse.   This information is not intended to replace advice given to you by your health care provider. Make sure you discuss any questions you have with your health care provider.   Document Released: 12/02/2011 Document Revised: 09/30/2014 Document Reviewed: 11/02/2013 Elsevier Interactive Patient Education 2016 Elsevier Inc[employer]  Cryotherapy Cryotherapy is when you put ice on your injury. Ice helps lessen pain and puffiness (swelling) after an injury. Ice works the best when you start using it in the first 24 to 48 hours after an injury. HOME CARE  Put a dry or damp towel between the ice pack and your skin.  You may press gently on the ice pack.  Leave the ice on for no more than 10 to 20 minutes at a time.  Check your skin after 5 minutes to make sure your skin is okay.  Rest at least  20 minutes between ice pack uses.  Stop using ice when your skin loses feeling (numbness).  Do not use ice on someone who cannot tell you when it hurts. This includes small children and people with memory problems (dementia). GET HELP RIGHT AWAY IF:  You have white spots on your skin.  Your skin turns blue or pale.  Your skin feels waxy or hard.  Your puffiness gets worse. MAKE SURE YOU:   Understand these instructions.  Will watch your condition.  Will get help right away if you are not doing well or get worse.   This information is not intended to replace advice given to you by your health care provider. Make sure you discuss any questions you have with your health care provider.   Document Released: 02/26/2008 Document Revised: 12/02/2011 Document Reviewed: 05/02/2011 Elsevier Interactive Patient Education [employer].
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
## 2015-10-30 ENCOUNTER — Other Ambulatory Visit: Payer: Self-pay | Admitting: Orthopedic Surgery

## 2015-11-06 ENCOUNTER — Encounter (HOSPITAL_BASED_OUTPATIENT_CLINIC_OR_DEPARTMENT_OTHER): Payer: Self-pay | Admitting: *Deleted

## 2015-12-08 ENCOUNTER — Ambulatory Visit (HOSPITAL_BASED_OUTPATIENT_CLINIC_OR_DEPARTMENT_OTHER): Admit: 2015-12-08 | Payer: 59 | Admitting: Orthopedic Surgery

## 2015-12-08 HISTORY — DX: Other tear of medial meniscus, current injury, unspecified knee, initial encounter: S83.249A

## 2015-12-08 SURGERY — ARTHROSCOPY, KNEE, WITH MENISCUS REPAIR
Anesthesia: Choice | Laterality: Right

## 2016-05-02 ENCOUNTER — Emergency Department (HOSPITAL_COMMUNITY)
Admission: EM | Admit: 2016-05-02 | Discharge: 2016-05-02 | Disposition: A | Discharge status: Home / Self Care | Payer: BLUE CROSS/BLUE SHIELD | Attending: Emergency Medicine | Admitting: Emergency Medicine

## 2016-05-02 ENCOUNTER — Encounter (HOSPITAL_COMMUNITY): Payer: Self-pay | Admitting: Emergency Medicine

## 2016-05-02 DIAGNOSIS — K029 Dental caries, unspecified: Secondary | ICD-10-CM

## 2016-05-02 DIAGNOSIS — K0889 Other specified disorders of teeth and supporting structures: Secondary | ICD-10-CM | POA: Diagnosis present

## 2016-05-02 DIAGNOSIS — K047 Periapical abscess without sinus: Secondary | ICD-10-CM

## 2016-05-02 DIAGNOSIS — F17210 Nicotine dependence, cigarettes, uncomplicated: Secondary | ICD-10-CM | POA: Diagnosis not present

## 2016-05-02 DIAGNOSIS — Z79899 Other long term (current) drug therapy: Secondary | ICD-10-CM | POA: Insufficient documentation

## 2016-05-02 MED ORDER — DOXYCYCLINE HYCLATE 100 MG PO CAPS: 100.0000 mg | ORAL_CAPSULE | Freq: Two times a day (BID) | ORAL | 0 refills | Status: DC

## 2016-05-02 MED ORDER — NAPROXEN 500 MG PO TABS: 500.0000 mg | ORAL_TABLET | Freq: Two times a day (BID) | ORAL | 0 refills | Status: DC | PRN

## 2016-05-02 NOTE — ED Provider Notes (Signed)
[facility] DEPT Provider Note   CSN: [id number] Arrival date & time: 05/02/16  1129  First Provider Contact:   First MD Initiated Contact with Patient 05/02/16 1140     By signing my name below, I, Essence Howell, attest that this documentation has been prepared under the direction and in the presence of [employer], PA-C Electronically Signed: Ladene Artist, ED Scribe 05/02/2016 at 11:51 AM.   History   Chief Complaint Chief Complaint  Patient presents with  . Dental Pain    HPI Randy Hansen is a 55 y.o. male who presents to the Emergency Department complaining of gradually worsening right lower dental pain for the past 3 days. Pt describes pain as constant, 10/10 non-radiating, throbbing sensation that is exacerbated with chewing. He reports associated gum swelling. Pt has tried Orajel without relief and a neighbor's Percocet with temporary relief. Denies gum drainage, drooling, trismus, fevers, chills, sore throat, rhinorrhea, ear pain/drainage, facial swelling, neck swelling, CP, SOB, abd pain, N/V/D/C, hematuria, dysuria, myalgias, arthralgias, numbness, tingling, weakness, or any other symptoms. No known drug allergies.  Pt denies tobacco use. Pt does not currently have a dentist.    The history is provided by the patient and medical records. No language interpreter was used.  Dental Pain   This is a new problem. The current episode started more than 2 days ago. The problem occurs constantly. The problem has been gradually worsening. The pain is at a severity of 10/10. The pain is moderate. Treatments tried: Stefanie Libel  The treatment provided mild relief.    Past Medical History:  Diagnosis Date  . Acute medial meniscal tear     There are no active problems to display for this patient.   Past Surgical History:  Procedure Laterality Date  . NO PAST SURGERIES       Home Medications    Prior to Admission medications   Medication Sig Start Date  End Date Taking? Authorizing Provider  HYDROcodone-acetaminophen (NORCO) 5-325 MG tablet Take 1 tablet by mouth every 6 (six) hours as needed for moderate pain. 07/06/15   Melony Overly, MD  traMADol (ULTRAM) 50 MG tablet Take 50 mg by mouth every 6 (six) hours as needed.    Historical Provider, MD    Family History No family history on file.  Social History Social History  Substance Use Topics  . Smoking status: Current Every Day Smoker    Packs/day: 0.50    Types: Cigarettes  . Smokeless tobacco: Current User  . Alcohol use 7.2 oz/week    12 Cans of beer per week     Comment: social     Allergies   Review of patient's allergies indicates no known allergies.   Review of Systems Review of Systems  Constitutional: Negative for chills and fever.  HENT: Positive for dental problem. Negative for drooling, ear discharge, ear pain, facial swelling, rhinorrhea, sore throat and trouble swallowing.   Respiratory: Negative for shortness of breath.   Cardiovascular: Negative for chest pain.  Gastrointestinal: Negative for abdominal pain, constipation, diarrhea, nausea and vomiting.  Genitourinary: Negative for dysuria and hematuria.  Musculoskeletal: Negative for arthralgias and myalgias.  Skin: Negative for color change.  Allergic/Immunologic: Negative for immunocompromised state.  Neurological: Negative for weakness and numbness.  Psychiatric/Behavioral: Negative for confusion.   A complete 10 system review of systems was obtained and all systems are negative except as noted in the HPI and PMH.   Physical Exam Updated Vital Signs BP 140/87 (BP Location:  Left Arm)   Pulse 89   Temp 98.8 F (37.1 C) (Oral)   Resp 17   Ht 5\' 11"  (1.803 m)   Wt 215 lb (97.5 kg)   SpO2 98%   BMI 29.99 kg/m   Physical Exam  Constitutional: He is oriented to person, place, and time. Vital signs are normal. He appears well-developed and well-nourished.  Non-toxic appearance. No distress.    Afebrile, nontoxic, NAD  HENT:  Head: Normocephalic and atraumatic.  Nose: Nose normal.  Mouth/Throat: Uvula is midline, oropharynx is clear and moist and mucous membranes are normal. No trismus in the jaw. Dental caries present. No dental abscesses or uvula swelling.    R lower molar #30 decayed and with minimal surrounding gingival erythema and swelling, no definite abscess. Diffuse dental decay throughout. No evidence of ludwig's. Nose clear. Oropharynx clear and moist, without uvular swelling or deviation, no trismus or drooling, no tonsillar swelling or erythema, no exudates.   Eyes: Conjunctivae and EOM are normal. Right eye exhibits no discharge. Left eye exhibits no discharge.  Neck: Normal range of motion. Neck supple.  Cardiovascular: Normal rate.   Pulmonary/Chest: Effort normal. No respiratory distress.  Abdominal: Normal appearance. He exhibits no distension.  Musculoskeletal: Normal range of motion.  Neurological: He is alert and oriented to person, place, and time. He has normal strength. No sensory deficit.  Skin: Skin is warm, dry and intact. No rash noted.  Psychiatric: He has a normal mood and affect.  Nursing note and vitals reviewed.  ED Treatments / Results  Labs (all labs ordered are listed, but only abnormal results are displayed) Labs Reviewed - No data to display  EKG  EKG Interpretation None       Radiology No results found.  Procedures Procedures (including critical care time) DIAGNOSTIC STUDIES: Oxygen Saturation is 98% on RA, normal by my interpretation.    COORDINATION OF CARE: 11:46 AM-Discussed treatment plan which includes Naproxen and doxycycline with pt at bedside and pt agreed to plan.   Medications Ordered in ED Medications - No data to display   Initial Impression / Assessment and Plan / ED Course  I have reviewed the triage vital signs and the nursing notes.  Pertinent labs & imaging results that were available during my care  of the patient were reviewed by me and considered in my medical decision making (see chart for details).  Clinical Course    55 y.o. male here with Dental pain associated with dental decay and possible dental infection with patient afebrile, non toxic appearing and swallowing secretions well, no evidence of ludwig's. I gave patient referral to dentist and stressed the importance of dental follow up for ultimate management of dental pain.  I have also discussed reasons to return immediately to the ER.  Patient expresses understanding and agrees with plan.  I will also give doxycycline and pain control.  I personally performed the services described in this documentation, which was scribed in my presence. The recorded information has been reviewed and is accurate.  BP 140/87 (BP Location: Left Arm)   Pulse 89   Temp 98.8 F (37.1 C) (Oral)   Resp 17   Ht 5\' 11"  (1.803 m)   Wt 97.5 kg   SpO2 98%   BMI 29.99 kg/m    Final Clinical Impressions(s) / ED Diagnoses   Final diagnoses:  Pain due to dental caries  Dental infection  Dental decay    New Prescriptions New Prescriptions   DOXYCYCLINE (VIBRAMYCIN)  100 MG CAPSULE    Take 1 capsule (100 mg total) by mouth 2 (two) times daily. One po bid x 7 days   NAPROXEN (NAPROSYN) 500 MG TABLET    Take 1 tablet (500 mg total) by mouth 2 (two) times daily as needed for mild pain, moderate pain or headache (TAKE WITH MEALS.).     [address] [place], PA-C 05/02/16 [place], MD 05/02/16 [phone]

## 2016-05-02 NOTE — ED Notes (Signed)
Declined W/C at D/C and was escorted to lobby by RN. 

## 2016-05-02 NOTE — Discharge Instructions (Signed)
Apply warm compresses to jaw throughout the day. Take antibiotic until finished. Use tylenol or naprosyn as directed as needed for pain. Perform salt water swishes to help with pain/swelling. Continue using oragel as needed for additional pain relief. Followup with a dentist is very important for ongoing evaluation and management of recurrent dental pain, call the dentist listed above in the next 24-48 hours to schedule ongoing dental care, or use the list below to find a dentist. Return to emergency department for emergent changing or worsening symptoms.  Emergency Department Resource Guide 1) Find a Doctor and Pay Out of Pocket Although you won't have to find out who is covered by your insurance plan, it is a good idea to ask around and get recommendations. You will then need to call the office and see if the doctor you have chosen will accept you as a new patient and what types of options they offer for patients who are self-pay. Some doctors offer discounts or will set up payment plans for their patients who do not have insurance, but you will need to ask so you aren't surprised when you get to your appointment.  2) Contact Your Local Health Department Not all health departments have doctors that can see patients for sick visits, but many do, so it is worth a call to see if yours does. If you don't know where your local health department is, you can check in your phone book. The CDC also has a tool to help you locate your state's health department, and many state websites also have listings of all of their local health departments.  3) Find a [facility] If your illness is not likely to be very severe or complicated, you may want to try a walk in clinic. These are popping up all over the country in pharmacies, drugstores, and shopping centers. They're usually staffed by nurse practitioners or physician assistants that have been trained to treat common illnesses and complaints. They're usually fairly  quick and inexpensive. However, if you have serious medical issues or chronic medical problems, these are probably not your best option.  No Primary Care Doctor: Call [facility] at  [phone] - they can help you locate a primary care doctor that  accepts your insurance, provides certain services, etc. Physician Referral Service- [phone]  Chronic Pain Problems: Organization         Address  Phone   Notes  [facility]  [phone] Patients need to be referred by their primary care doctor.   Medication Assistance: Organization         Address  Phone   Notes  [facility] Medication [facility] [place] [place], [place] [address] [phone] --Must be a resident of [facility] -- Must have NO insurance coverage whatsoever (no Medicaid/ Medicare, etc.) -- The pt. MUST have a primary care doctor that directs their care regularly and follows them in the community   [facility]  [phone]   [place]  [phone]     Dental Care: Organization         Address  Phone  Notes  [facility] Department of [facility] [phone] Accepts children up to age 43 who are enrolled in [place] or [place]; pregnant women with a Medicaid card; and children who have applied for Medicaid or [place] Health Choice, but were declined, whose parents can pay a reduced fee at  time of service.  [facility] Department of [facility]  [address], [place] [phone] Accepts children up to age 58 who are enrolled in [place] or [place]; pregnant women with a Medicaid card; and children who have applied for Medicaid or [place] Health Choice, but were declined, whose parents can pay a reduced fee at time of service.  [facility]  [place] [phone] Patients are seen by appointment only. Walk-ins  are not accepted. [place] will see patients 62 years of age and older. Monday - Tuesday (8am-5pm) Most Wednesdays (8:30-5pm) $30 per visit, cash only  [facility] Adult Dental Access PROGRAM  [address], [facility] [phone] Patients are seen by appointment only. Walk-ins are not accepted. [place] will see patients 56 years of age and older. One Wednesday Evening (Monthly: Volunteer Based).  $30 per visit, cash only  [place]  [phone] for adults; Children under age 55, call Graduate Pediatric Dentistry at [phone]. Children aged 71-14, please call [phone] to request a pediatric application.  Dental services are provided in all areas of dental care including fillings, crowns and bridges, complete and partial dentures, implants, gum treatment, root canals, and extractions. Preventive care is also provided. Treatment is provided to both adults and children. Patients are selected via a lottery and there is often a waiting list.   [facility] [address], [place]  [phone] [email or web address]   [facility] [address] , [place] [phone], Ext. [phone] Second and Fourth Thursday of each month, opens at 6:30 AM; Clinic ends at 9 AM.  Patients are seen on a first-come first-served basis, and a limited number are seen during each clinic.   [facility]  [address] [place], [place] [phone]   Eligibility Requirements You must have lived in [place], [place], or [place] counties for at least the last three months.   You cannot be eligible for state or federal sponsored [employer], including [employer], [place], or [employer].   You generally cannot be eligible for healthcare insurance through your employer.    How to apply: Eligibility screenings are held every Tuesday and Wednesday afternoon from 1:00 pm until 4:00 pm. You do not need an appointment  for the interview!  [facility] [address] , [place], [place]   [place]  [place]  [place]  [phone]

## 2016-05-02 NOTE — ED Triage Notes (Signed)
Pt. Stated, I've got a bad tooth on the bottom right for about 3 days.

## 2016-05-14 ENCOUNTER — Encounter (HOSPITAL_COMMUNITY): Payer: Self-pay | Admitting: *Deleted

## 2016-05-14 ENCOUNTER — Ambulatory Visit (HOSPITAL_COMMUNITY)
Admission: EM | Admit: 2016-05-14 | Discharge: 2016-05-14 | Disposition: A | Discharge status: Home / Self Care | Payer: BLUE CROSS/BLUE SHIELD | Attending: Family Medicine | Admitting: Family Medicine

## 2016-05-14 DIAGNOSIS — S025XXA Fracture of tooth (traumatic), initial encounter for closed fracture: Secondary | ICD-10-CM

## 2016-05-14 MED ORDER — DICLOFENAC POTASSIUM 50 MG PO TABS: 50.0000 mg | ORAL_TABLET | Freq: Three times a day (TID) | ORAL | 0 refills | Status: DC

## 2016-05-14 MED ORDER — CLINDAMYCIN HCL 150 MG PO CAPS: 150.0000 mg | ORAL_CAPSULE | Freq: Four times a day (QID) | ORAL | 0 refills | Status: DC

## 2016-05-14 NOTE — Discharge Instructions (Signed)
Take medicine as prescribed, see your dentist as soon as possible °

## 2016-05-14 NOTE — ED Provider Notes (Signed)
[facility]    CSN: [id number] Arrival date & time: 05/14/16  1421  First Provider Contact:  First MD Initiated Contact with Patient 05/14/16 1525        History   Chief Complaint Chief Complaint  Patient presents with  . Dental Pain    HPI Randy Hansen is a 55 y.o. male.    Dental Pain  Location:  Lower and upper Lower teeth location:  31/RL 2nd molar Quality:  Throbbing and pulsating Severity:  Moderate Onset quality:  Gradual Duration:  1 month Progression:  Worsening Chronicity:  New Context: abscess, dental caries, dental fracture and poor dentition   Relieved by:  None tried Worsened by:  Nothing Ineffective treatments:  None tried Associated symptoms: facial pain and facial swelling   Associated symptoms: no fever   Risk factors: lack of dental care     Past Medical History:  Diagnosis Date  . Acute medial meniscal tear     There are no active problems to display for this patient.   Past Surgical History:  Procedure Laterality Date  . NO PAST SURGERIES         Home Medications    Prior to Admission medications   Medication Sig Start Date End Date Taking? Authorizing Provider  doxycycline (VIBRAMYCIN) 100 MG capsule Take 1 capsule (100 mg total) by mouth 2 (two) times daily. One po bid x 7 days 05/02/16   Mercedes Camprubi-Soms, PA-C  HYDROcodone-acetaminophen (NORCO) 5-325 MG tablet Take 1 tablet by mouth every 6 (six) hours as needed for moderate pain. 07/06/15   Melony Overly, MD  naproxen (NAPROSYN) 500 MG tablet Take 1 tablet (500 mg total) by mouth 2 (two) times daily as needed for mild pain, moderate pain or headache (TAKE WITH MEALS.). 05/02/16   Mercedes Camprubi-Soms, PA-C  traMADol (ULTRAM) 50 MG tablet Take 50 mg by mouth every 6 (six) hours as needed.    Historical Provider, MD    Family History History reviewed. No pertinent family history.  Social History Social History  Substance Use Topics  . Smoking status:  Current Every Day Smoker    Packs/day: 0.50    Types: Cigarettes  . Smokeless tobacco: Current User  . Alcohol use 7.2 oz/week    12 Cans of beer per week     Comment: social     Allergies   Review of patient's allergies indicates no known allergies.   Review of Systems Review of Systems  Constitutional: Negative for fever.  HENT: Positive for dental problem and facial swelling.   All other systems reviewed and are negative.    Physical Exam Triage Vital Signs ED Triage Vitals  Enc Vitals Group     BP 05/14/16 1452 147/80     Pulse Rate 05/14/16 1452 87     Resp 05/14/16 1452 16     Temp 05/14/16 1452 98.3 F (36.8 C)     Temp Source 05/14/16 1452 Oral     SpO2 05/14/16 1452 96 %     Weight 05/14/16 1452 220 lb (99.8 kg)     Height 05/14/16 1452 5\' 11"  (1.803 m)     Head Circumference --      Peak Flow --      Pain Score 05/14/16 1504 10     Pain Loc --      Pain Edu? --      Excl. in [place]? --    No data found.   Updated Vital Signs BP  147/80 (BP Location: Right Arm)   Pulse 87   Temp 98.3 F (36.8 C) (Oral)   Resp 16   Ht 5\' 11"  (1.803 m)   Wt 220 lb (99.8 kg)   SpO2 96%   BMI 30.68 kg/m   Visual Acuity Right Eye Distance:   Left Eye Distance:   Bilateral Distance:    Right Eye Near:   Left Eye Near:    Bilateral Near:     Physical Exam  Constitutional: He appears well-developed and well-nourished. He appears distressed.  HENT:  Mouth/Throat: Mucous membranes are normal. Abnormal dentition. Dental abscesses and dental caries present.    Nursing note and vitals reviewed.    UC Treatments / Results  Labs (all labs ordered are listed, but only abnormal results are displayed) Labs Reviewed - No data to display  EKG  EKG Interpretation None       Radiology No results found.  Procedures Procedures (including critical care time)  Medications Ordered in UC Medications - No data to display   Initial Impression / Assessment and  Plan / UC Course  I have reviewed the triage vital signs and the nursing notes.  Pertinent labs & imaging results that were available during my care of the patient were reviewed by me and considered in my medical decision making (see chart for details).  Clinical Course      Final Clinical Impressions(s) / UC Diagnoses   Final diagnoses:  None    New Prescriptions New Prescriptions   No medications on file     Billy Fischer, MD 05/14/16 1538

## 2016-05-14 NOTE — ED Triage Notes (Signed)
Pt  Has   Dental  Pain  r  Lower     Jaw  And  r  Upper  Jaw   Pt  Has  Dental  Disease      States  Is  Unable  To  See  A  Dentist    Due  To  Finances

## 2016-09-23 DIAGNOSIS — D3501 Benign neoplasm of right adrenal gland: Secondary | ICD-10-CM

## 2016-09-23 HISTORY — DX: Benign neoplasm of right adrenal gland: D35.01

## 2016-11-06 ENCOUNTER — Encounter (HOSPITAL_COMMUNITY): Payer: Self-pay | Admitting: Emergency Medicine

## 2016-11-06 ENCOUNTER — Ambulatory Visit (HOSPITAL_COMMUNITY)
Admission: EM | Admit: 2016-11-06 | Discharge: 2016-11-06 | Disposition: A | Discharge status: Home / Self Care | Payer: Self-pay

## 2016-11-06 DIAGNOSIS — S63502A Unspecified sprain of left wrist, initial encounter: Secondary | ICD-10-CM

## 2016-11-06 DIAGNOSIS — W19XXXA Unspecified fall, initial encounter: Secondary | ICD-10-CM

## 2016-11-06 MED ORDER — NAPROXEN 500 MG PO TABS: 500.0000 mg | ORAL_TABLET | Freq: Two times a day (BID) | ORAL | 0 refills | Status: DC

## 2016-11-06 NOTE — ED Provider Notes (Signed)
CSN: [id number]     Arrival date & time 11/06/16  1016 History   First MD Initiated Contact with Patient 11/06/16 1110     Chief Complaint  Patient presents with  . 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   The history is provided by the patient.  Fall  This is a new problem. The current episode started less than 1 hour ago. The problem occurs constantly. The problem has not changed since onset.Nothing aggravates the symptoms. Nothing relieves the symptoms. He has tried nothing for the symptoms.    Past Medical History:  Diagnosis Date  . Acute medial meniscal tear    Past Surgical History:  Procedure Laterality Date  . NO PAST SURGERIES     History reviewed. No pertinent family history. Social History  Substance Use Topics  . Smoking status: Current Every Day Smoker    Packs/day: 0.50    Types: Cigarettes  . Smokeless tobacco: Current User  . Alcohol use 7.2 oz/week    12 Cans of beer per week     Comment: social    Review of Systems  Constitutional: Negative.   HENT: Negative.   Eyes: Negative.   Respiratory: Negative.   Cardiovascular: Negative.   Gastrointestinal: Negative.   Endocrine: Negative.   Genitourinary: Negative.   Musculoskeletal: Positive for arthralgias.  Allergic/Immunologic: Negative.   Neurological: Negative.   Hematological: Negative.   Psychiatric/Behavioral: Negative.     Allergies  Patient has no known allergies.  Home Medications   Prior to Admission medications   Medication Sig Start Date End Date Taking? Authorizing Provider  ibuprofen (ADVIL,MOTRIN) 400 MG tablet Take 400 mg by mouth every 6 (six) hours as needed.   Yes Historical Provider, MD  naproxen (NAPROSYN) 500 MG tablet Take 1 tablet (500 mg total) by mouth 2 (two) times daily with a meal. 11/06/16   Lysbeth Penner, FNP   Meds Ordered and Administered this Visit  Medications - No data to display  BP 144/97 (BP Location: Right Arm)   Pulse 96   Temp 97.6 F (36.4 C) (Oral)   Resp 18   SpO2 99%  No data found.   Physical Exam  Constitutional: He appears well-developed and well-nourished.  HENT:  Head: Normocephalic and atraumatic.  Eyes: Conjunctivae and EOM are normal. Pupils are equal, round, and reactive to light.  Neck: Normal range of motion. Neck supple.  Cardiovascular: Normal rate, regular rhythm and normal heart sounds.   Pulmonary/Chest: Effort normal and breath sounds normal.  Abdominal:  Left side lateral abdomen with lipoma  Musculoskeletal: He exhibits tenderness.  Left wrist w/o deformity or swelling. No focal tenderness. Moderate tenderness with ROM left wrist.  Nursing note and vitals reviewed.   Urgent Care Course     Procedures (including critical care time)  Labs Review Labs Reviewed - No data to display  Imaging Review No results found.   Visual Acuity Review  Right Eye Distance:   Left Eye Distance:   Bilateral Distance:    Right Eye Near:   Left Eye Near:    Bilateral Near:         MDM   1. Fall, initial encounter   2. Sprain of left wrist, initial encounter    Left wrist splint Naprosyn 500mg  one po bid x 7 days #14      Lysbeth Penner, FNP 11/06/16 1127

## 2016-11-06 NOTE — ED Triage Notes (Signed)
The patient presented to the [facility] with a complaint of right wrist and right hip pain secondary to a fall of 2 feet yesterday.

## 2016-11-10 ENCOUNTER — Ambulatory Visit (HOSPITAL_COMMUNITY)
Admission: EM | Admit: 2016-11-10 | Discharge: 2016-11-10 | Disposition: A | Discharge status: Home / Self Care | Payer: 59 | Attending: Family Medicine | Admitting: Family Medicine

## 2016-11-10 DIAGNOSIS — K047 Periapical abscess without sinus: Secondary | ICD-10-CM

## 2016-11-10 DIAGNOSIS — K029 Dental caries, unspecified: Secondary | ICD-10-CM | POA: Diagnosis not present

## 2016-11-10 MED ORDER — CLINDAMYCIN HCL 150 MG PO CAPS: 150.0000 mg | ORAL_CAPSULE | Freq: Four times a day (QID) | ORAL | 0 refills | Status: DC

## 2016-11-10 MED ORDER — BUPIVACAINE-EPINEPHRINE (PF) 0.5% -1:200000 IJ SOLN
INTRAMUSCULAR | Status: AC
  Filled 2016-11-10: qty 1.8

## 2016-11-10 MED ORDER — DICLOFENAC POTASSIUM 50 MG PO TABS: 50.0000 mg | ORAL_TABLET | Freq: Three times a day (TID) | ORAL | 0 refills | Status: DC

## 2016-11-10 NOTE — Discharge Instructions (Signed)
Take medicine as prescribed, see your dentist as soon as possible °

## 2016-11-10 NOTE — ED Provider Notes (Signed)
[place]    CSN: [id number] Arrival date & time: 11/10/16  1212     History   Chief Complaint Chief Complaint  Patient presents with  . Dental Pain    HPI Randy Hansen is a 56 y.o. male.   The history is provided by the patient.  Dental Pain  Location:  Lower Lower teeth location:  30/RL 1st molar Quality:  Pulsating and throbbing Severity:  Moderate Onset quality:  Gradual Duration:  3 days Progression:  Worsening Chronicity:  New Context: abscess and poor dentition   Relieved by:  None tried Associated symptoms: facial swelling and gum swelling   Risk factors: lack of dental care     Past Medical History:  Diagnosis Date  . Acute medial meniscal tear     There are no active problems to display for this patient.   Past Surgical History:  Procedure Laterality Date  . NO PAST SURGERIES         Home Medications    Prior to Admission medications   Medication Sig Start Date End Date Taking? Authorizing Provider  clindamycin (CLEOCIN) 150 MG capsule Take 1 capsule (150 mg total) by mouth 4 (four) times daily. 11/10/16   Billy Fischer, MD  diclofenac (CATAFLAM) 50 MG tablet Take 1 tablet (50 mg total) by mouth 3 (three) times daily. 11/10/16   Billy Fischer, MD  ibuprofen (ADVIL,MOTRIN) 400 MG tablet Take 400 mg by mouth every 6 (six) hours as needed.    Historical Provider, MD  naproxen (NAPROSYN) 500 MG tablet Take 1 tablet (500 mg total) by mouth 2 (two) times daily with a meal. 11/06/16   Lysbeth Penner, FNP    Family History No family history on file.  Social History Social History  Substance Use Topics  . Smoking status: Current Every Day Smoker    Packs/day: 0.50    Types: Cigarettes  . Smokeless tobacco: Current User  . Alcohol use 7.2 oz/week    12 Cans of beer per week     Comment: social     Allergies   Patient has no known allergies.   Review of Systems Review of Systems  HENT: Positive for dental problem and  facial swelling.   All other systems reviewed and are negative.    Physical Exam Triage Vital Signs ED Triage Vitals [11/10/16 1315]  Enc Vitals Group     BP 144/86     Pulse Rate 96     Resp 16     Temp 98.5 F (36.9 C)     Temp Source Oral     SpO2      Weight      Height      Head Circumference      Peak Flow      Pain Score 10     Pain Loc      Pain Edu?      Excl. in [place]?    No data found.   Updated Vital Signs BP 144/86 (BP Location: Left Arm)   Pulse 96   Temp 98.5 F (36.9 C) (Oral)   Resp 16   Visual Acuity Right Eye Distance:   Left Eye Distance:   Bilateral Distance:    Right Eye Near:   Left Eye Near:    Bilateral Near:     Physical Exam  Constitutional: He appears well-developed and well-nourished. He appears distressed.  HENT:  Right Ear: External ear normal.  Left Ear: External ear  normal.  Mouth/Throat: Oropharynx is clear and moist. Abnormal dentition. Dental abscesses present.    Nursing note and vitals reviewed.    UC Treatments / Results  Labs (all labs ordered are listed, but only abnormal results are displayed) Labs Reviewed - No data to display  EKG  EKG Interpretation None       Radiology No results found.  Procedures Procedures (including critical care time)  Medications Ordered in UC Medications - No data to display   Initial Impression / Assessment and Plan / UC Course  I have reviewed the triage vital signs and the nursing notes.  Pertinent labs & imaging results that were available during my care of the patient were reviewed by me and considered in my medical decision making (see chart for details).       Final Clinical Impressions(s) / UC Diagnoses   Final diagnoses:  Dental abscess  Pain due to dental caries    New Prescriptions New Prescriptions   CLINDAMYCIN (CLEOCIN) 150 MG CAPSULE    Take 1 capsule (150 mg total) by mouth 4 (four) times daily.   DICLOFENAC (CATAFLAM) 50 MG TABLET    Take  1 tablet (50 mg total) by mouth 3 (three) times daily.     Billy Fischer, MD 11/10/16 1425

## 2016-11-10 NOTE — ED Triage Notes (Signed)
C/o right tooth pain States he has no insurance  No dentist

## 2016-12-03 ENCOUNTER — Ambulatory Visit (INDEPENDENT_AMBULATORY_CARE_PROVIDER_SITE_OTHER): Payer: 59 | Admitting: Physician Assistant

## 2016-12-04 ENCOUNTER — Encounter (INDEPENDENT_AMBULATORY_CARE_PROVIDER_SITE_OTHER): Payer: Self-pay | Admitting: Physician Assistant

## 2016-12-04 ENCOUNTER — Emergency Department (HOSPITAL_COMMUNITY): Admission: EM | Admit: 2016-12-04 | Discharge: 2016-12-04 | Discharge status: Against Medical Advice | Payer: 59

## 2016-12-04 ENCOUNTER — Ambulatory Visit (INDEPENDENT_AMBULATORY_CARE_PROVIDER_SITE_OTHER): Payer: Self-pay | Admitting: Physician Assistant

## 2016-12-04 VITALS — BP 128/68 | HR 75 | Temp 98.1°F | Ht 71.0 in | Wt 224.2 lb

## 2016-12-04 DIAGNOSIS — Z599 Problem related to housing and economic circumstances, unspecified: Secondary | ICD-10-CM

## 2016-12-04 DIAGNOSIS — Z598 Other problems related to housing and economic circumstances: Secondary | ICD-10-CM

## 2016-12-04 DIAGNOSIS — H547 Unspecified visual loss: Secondary | ICD-10-CM

## 2016-12-04 DIAGNOSIS — K0889 Other specified disorders of teeth and supporting structures: Secondary | ICD-10-CM

## 2016-12-04 MED ORDER — AMOXICILLIN-POT CLAVULANATE 875-125 MG PO TABS: 1.0000 | ORAL_TABLET | Freq: Two times a day (BID) | ORAL | 0 refills | Status: AC

## 2016-12-04 MED ORDER — ACETAMINOPHEN 500 MG PO TABS: 1000.0000 mg | ORAL_TABLET | Freq: Three times a day (TID) | ORAL | 0 refills | Status: AC | PRN

## 2016-12-04 NOTE — ED Notes (Signed)
Called in lobby with no answer. Walked over entire lobby.

## 2016-12-04 NOTE — ED Triage Notes (Signed)
Called patient X2. No response.

## 2016-12-04 NOTE — ED Notes (Signed)
Pt has now been called 4 times with no response. Dismissed from system.

## 2016-12-04 NOTE — Patient Instructions (Signed)
[employer] ([employer]): Former offenders are assisted with job Advice worker including writing resumes and learning how to do job interviews. Assistance is also given with transportation and advice on keeping a job after getting it. [employer] (http://www.centerforcommunitytransitions.org/): This organization helps with transitional services for ex-offenders including job placement and counselling for the formerly incarcerated and their families.    Dental Pain Dental pain may be caused by many things, including:  Tooth decay (cavities or caries). Cavities cause the nerve of your tooth to be open to air and hot or cold temperatures. This can cause pain or discomfort.  Abscess or infection. A dental abscess is an area that is full of infected pus from a bacterial infection in the inner part of the tooth (pulp). It usually happens at the end of the tooth's root.  Injury.  An unknown reason (idiopathic). Your pain may be mild or severe. It may only happen when:  You are chewing.  You are exposed to hot or cold temperature.  You are eating or drinking sugary foods or beverages, such as:  Soda.  Candy. Your pain may also be there all of the time. Follow these instructions at home: Watch your dental pain for any changes. Do these things to lessen your discomfort:  Take medicines only as told by your dentist.  If your dentist tells you to take an antibiotic medicine, finish all of it even if you start to feel better.  Keep all follow-up visits as told by your dentist. This is important.  Do not apply heat to the outside of your face.  Rinse your mouth or gargle with salt water if told by your dentist. This helps with pain and swelling.  You can make salt water by adding  tsp of salt to 1 cup of warm water.  Apply ice to the painful area of your face:  Put ice in a plastic bag.  Place a towel  between your skin and the bag.  Leave the ice on for 20 minutes, 2-3 times per day.  Avoid foods or drinks that cause you pain, such as:  Very hot or very cold foods or drinks.  Sweet or sugary foods or drinks. Contact a doctor if:  Your pain is not helped with medicines.  Your symptoms are worse.  You have new symptoms. Get help right away if:  You cannot open your mouth.  You are having trouble breathing or swallowing.  You have a fever.  Your face, neck, or jaw is puffy (swollen). This information is not intended to replace advice given to you by your health care provider. Make sure you discuss any questions you have with your health care provider. Document Released: 02/26/2008 Document Revised: 02/15/2016 Document Reviewed: 09/05/2014 Elsevier Interactive Patient Education  2017 [employer].

## 2016-12-04 NOTE — Progress Notes (Signed)
Subjective:  Patient ID: Randy Hansen, male    DOB: 06-Jul-1961  Age: 56 y.o. MRN: [id number]  CC: tooth pain and blurred vision  HPI Randy Hansen is a 56 y.o. male with no significant PMH presents with pain in multiple teeth. Pain when eating, drinking, and exposed to air. Has not been to a dentist due to financial difficulties. He is an ex-offender and has difficulty gaining employment. He would like to be referred to dentist after applying for financial assistance. 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 Says he has to strain to be able to see normally. Denies any other symptoms.     Outpatient Medications Prior to Visit  Medication Sig Dispense Refill  . diclofenac (CATAFLAM) 50 MG tablet Take 1 tablet (50 mg total) by mouth 3 (three) times daily. (Patient not taking: Reported on 12/04/2016) 15 tablet 0  . ibuprofen (ADVIL,MOTRIN) 400 MG tablet Take 400 mg by mouth every 6 (six) hours as needed.    . naproxen (NAPROSYN) 500 MG tablet Take 1 tablet (500 mg total) by mouth 2 (two) times daily with a meal. (Patient not taking: Reported on 12/04/2016) 14 tablet 0  . clindamycin (CLEOCIN) 150 MG capsule Take 1 capsule (150 mg total) by mouth 4 (four) times daily. 28 capsule 0   No facility-administered medications prior to visit.      ROS Review of Systems  Constitutional: Negative for chills, fever and malaise/fatigue.  Eyes: Positive for blurred vision.  Respiratory: Negative for shortness of breath.   Cardiovascular: Negative for chest pain and palpitations.  Gastrointestinal: Negative for abdominal pain and nausea.  Genitourinary: Negative for dysuria and hematuria.  Musculoskeletal: Negative for joint pain and myalgias.  Skin: Negative for rash.  Neurological: Negative for tingling and headaches.  Psychiatric/Behavioral: Negative for depression. The patient is not nervous/anxious.      Objective:  BP 128/68 (BP Location: Right Arm, Patient Position: Sitting, Cuff Size: Large)   Pulse 75   Temp 98.1 F (36.7 C) (Oral)   Ht 5\' 11"  (1.803 m)   Wt 224 lb 3.2 oz (101.7 kg)   SpO2 98%   BMI 31.27 kg/m   BP/Weight 12/04/2016 11/10/2016 2/72/5366  Systolic BP 440 347 425  Diastolic BP 68 86 97  Wt. (Lbs) 224.2 - -  BMI 31.27 - -      Physical Exam  Constitutional: He is oriented to person, place, and time.  Well developed, overweight, NAD, polite, talkative  HENT:  Head: Normocephalic and atraumatic.  Overall poor dentition with multiple cavities. Tooth 1,3,9, 16, and 30 are of particular concern due to severe wasting, cracking, and surrounding periodontal disease.  Eyes: No scleral icterus.  OS 20/20, OD 20/20, OU 20/40 by handheld Snellen.  Neck: Normal range of motion.  Cardiovascular: Normal rate, regular rhythm and normal heart sounds.   Pulmonary/Chest: Effort normal and breath sounds normal.  Musculoskeletal: He exhibits no edema.  Neurological: He is alert and oriented to person, place, and time.  Skin: Skin is warm and dry. No rash noted. No erythema. No pallor.  Psychiatric: He has a normal mood and affect. His behavior is normal. Thought content normal.  Vitals reviewed.    Assessment & Plan:   1. Toothache - 1,3, 9,16,30 with severe pain.  - Urgent dental referral made  2. Loss of vision - OS 20/20, OD 20/20, OU 20/40 - Attributed  to welding - Ophthalmology referral made  3. Financial difficulties - I have informed patient of the following:   Heritage manager ([employer]): Former offenders are assisted with job Advice worker including writing resumes and learning how to do job interviews. Assistance is also given with transportation and advice on keeping a job after getting it. [employer] (http://www.centerforcommunitytransitions.org/): This organization helps  with transitional services for ex-offenders including job placement and counselling for the formerly incarcerated and their families.     Follow-up: PRN. Return for full physical at [facility] [place] [facility]

## 2016-12-05 ENCOUNTER — Encounter (INDEPENDENT_AMBULATORY_CARE_PROVIDER_SITE_OTHER): Payer: Self-pay | Admitting: Physician Assistant

## 2016-12-10 MED FILL — AMOX-CLAV 875-125 MG TABLET: 875-125 | 10 days supply | Qty: 20 | Fill #0

## 2016-12-13 ENCOUNTER — Ambulatory Visit (INDEPENDENT_AMBULATORY_CARE_PROVIDER_SITE_OTHER): Payer: Self-pay

## 2016-12-18 ENCOUNTER — Telehealth: Payer: Self-pay

## 2016-12-18 NOTE — Congregational Nurse Program (Signed)
Congregational Nurse Program Note  Date of Encounter: 12/17/2016  Past Medical History: Past Medical History:  Diagnosis Date  . Acute medial meniscal tear     Encounter Details:     CNP Questionnaire - 12/18/16 1758      Patient Demographics   Is this a new or existing patient? New   Patient is considered a/an Not Applicable   Race African-American/Black     Patient Assistance   Location of Patient Assistance Not Applicable   Patient's financial/insurance status Self-Pay (Uninsured)   Uninsured Patient ([facility] Card/Care Connects) Yes   Interventions Counseled to make appt. with provider   Patient referred to apply for the following financial assistance [place] insecurities addressed Not Applicable   Transportation assistance No   Assistance securing medications No   Educational health offerings Medications;Navigating the healthcare system     Encounter Details   Primary purpose of visit Acute Illness/Condition Visit;Navigating the Healthcare System;Spiritual Care/Support Visit   Was an Emergency Department visit averted? Yes   Does patient have a medical provider? Yes   Patient referred to Follow up with established PCP;Clinic   Was a mental health screening completed? (GAINS tool) No   Does patient have dental issues? Yes   Was a dental referral made? Yes   Does patient have vision issues? Yes   Was a vision referral made? Yes   Does your patient have an abnormal blood pressure today? No   Since previous encounter, have you referred patient for abnormal blood pressure that resulted in a new diagnosis or medication change? No   Does your patient have an abnormal blood glucose today? No   Since previous encounter, have you referred patient for abnormal blood glucose that resulted in a new diagnosis or medication change? No   Was there a life-saving intervention made? No         Clinical Intake - 12/04/16 1506      Pre-visit preparation   Pre-visit preparation completed Yes     Abuse/Neglect   Do you feel unsafe in your current relationship? No   Do you feel physically threatened by others? No   Anyone hurting you at home, work, or school? No   Unable to ask? No     Patient Literacy   How often do you need to have someone help you when you read instructions, pamphlets, or other written materials from your doctor or pharmacy? 1 - Never   What is the last grade level you completed in school? GED     Language Assistant   Interpreter Needed? No    Initial visit  For this  Young  Man . In with  Concerns about his  Left  Side with a bulging  Cyst like growth that is underneath the skin ?? Hernia like. Referred to see his  PCP at  [place] on tomorrow. Referral given to  Client. Reports  No abdominal pain noticed  Area 2 days ago .difficult  To sleep on his  Side. 0-10 states his  Pain from it is between 4-5. No redness ,no drainage just a bulging under the skin mid waste.  Client really states his dental pain is worst .0-10 ,it is a 9 . Cant be seen in free dental clinic ,no orange card . Nurse will see what she can do for his dental needs. Also client stated he was going to [facility] for blood  Work ,nurse discouraged client on the use of  both  Services . Client will stay with  [facility]. Follow up  Pass seeing  [place] for area on side.

## 2016-12-18 NOTE — Telephone Encounter (Addendum)
Nurse called  Dentist office to see if they would   Be willing to see client . Office  Management talked over with MD and they will see client this  Pm @ 3:15 pm.  [address] Will notify client. Of appointment availability.

## 2016-12-20 ENCOUNTER — Encounter (HOSPITAL_COMMUNITY): Payer: Self-pay | Admitting: Adult Health

## 2016-12-20 ENCOUNTER — Emergency Department (HOSPITAL_COMMUNITY)
Admission: EM | Admit: 2016-12-20 | Discharge: 2016-12-20 | Disposition: A | Discharge status: Home / Self Care | Payer: Self-pay | Attending: Emergency Medicine | Admitting: Emergency Medicine

## 2016-12-20 DIAGNOSIS — D171 Benign lipomatous neoplasm of skin and subcutaneous tissue of trunk: Secondary | ICD-10-CM

## 2016-12-20 DIAGNOSIS — D1724 Benign lipomatous neoplasm of skin and subcutaneous tissue of left leg: Secondary | ICD-10-CM | POA: Insufficient documentation

## 2016-12-20 DIAGNOSIS — F17210 Nicotine dependence, cigarettes, uncomplicated: Secondary | ICD-10-CM | POA: Insufficient documentation

## 2016-12-20 MED ORDER — NAPROXEN 500 MG PO TABS: 500.0000 mg | ORAL_TABLET | Freq: Two times a day (BID) | ORAL | 0 refills | Status: DC

## 2016-12-20 NOTE — ED Provider Notes (Signed)
[facility] DEPT Provider Note   CSN: [id number] Arrival date & time: 12/20/16  1218  By signing my name below, I, Neta Mends, attest that this documentation has been prepared under the direction and in the presence of Waynetta Pean, PA-C. Electronically Signed: Neta Mends, ED Scribe. 12/20/2016. 1:25 PM.    History   Chief Complaint Chief Complaint  Patient presents with  . Abscess   The history is provided by the patient. No language interpreter was used.   HPI Comments: Randy Hansen is a 56 y.o. male who presents to the Emergency Department complaining of a moderate, gradually worsening area of pain and swelling to the left hip x 2-3 years. Pt was seen here in 2016 for the same and was diagnosed with a lipoma. Pt denies any pain with palpation.Marland Kitchen He notes that he was doing heavy lifting at work last week and he bumped an object into the lipoma.  Pt denies drainage from the area or rashes. He has taken Advil with no relief. Pt denies other associated symptoms, trouble walking, numbness, rashes, weakness, or fevers.    Past Medical History:  Diagnosis Date  . Acute medial meniscal tear     There are no active problems to display for this patient.   Past Surgical History:  Procedure Laterality Date  . NO PAST SURGERIES         Home Medications    Prior to Admission medications   Medication Sig Start Date End Date Taking? Authorizing Provider  naproxen (NAPROSYN) 500 MG tablet Take 1 tablet (500 mg total) by mouth 2 (two) times daily with a meal. 12/20/16   Waynetta Pean, PA-C    Family History History reviewed. No pertinent family history.  Social History Social History  Substance Use Topics  . Smoking status: Current Every Day Smoker    Packs/day: 0.50    Types: Cigarettes  . Smokeless tobacco: Current User  . Alcohol use 7.2 oz/week    12 Cans of beer per week     Comment: social     Allergies   Patient has no known  allergies.   Review of Systems Review of Systems  Constitutional: Negative for fever.  Musculoskeletal: Negative for arthralgias and gait problem.  Skin: Negative for color change, rash and wound.       Positive for mass  Neurological: Negative for weakness and numbness.     Physical Exam Updated Vital Signs BP 130/88 (BP Location: Right Arm)   Pulse (!) 101   Temp 98.7 F (37.1 C) (Oral)   Resp 16   SpO2 99%   Physical Exam  Constitutional: He appears well-developed and well-nourished. No distress.  Nontoxic appearing.  HENT:  Head: Normocephalic and atraumatic.  Eyes: Right eye exhibits no discharge. Left eye exhibits no discharge.  Cardiovascular: Normal rate, regular rhythm, normal heart sounds and intact distal pulses.   HR 88bpm  Pulmonary/Chest: Effort normal and breath sounds normal. No respiratory distress.  Abdominal: Soft. There is no tenderness.  Musculoskeletal: Normal range of motion. He exhibits no edema or tenderness.  Neurological: He is alert. Coordination normal.  Skin: Skin is warm and dry. No rash noted. He is not diaphoretic. No erythema. No pallor.  7x4cm mobile soft mass to left hip. No induration or fluctuance. No overlying skin changes. No erythema or edema. No discharge. It is nontender to palpation.  Psychiatric: He has a normal mood and affect. His behavior is normal.  Nursing note and vitals reviewed.  ED Treatments / Results  DIAGNOSTIC STUDIES:  Oxygen Saturation is 99% on RA, normal by my interpretation.    COORDINATION OF CARE:  1:24 PM Will order naprosyn and refer to general surgery. Discussed treatment plan with pt at bedside and pt agreed to plan.   Labs (all labs ordered are listed, but only abnormal results are displayed) Labs Reviewed - No data to display  EKG  EKG Interpretation None       Radiology No results found.  Procedures Procedures (including critical care time)  Medications Ordered in  ED Medications - No data to display   Initial Impression / Assessment and Plan / ED Course  I have reviewed the triage vital signs and the nursing notes.  Pertinent labs & imaging results that were available during my care of the patient were reviewed by me and considered in my medical decision making (see chart for details).    This is a 56 y.o. male who presents to the Emergency Department complaining of a moderate, gradually worsening area of pain and swelling to the left hip x 2-3 years. Pt was seen here in 2016 for the same and was diagnosed with a lipoma. Pt denies any pain with palpation.Marland Kitchen He notes that he was doing heavy lifting at work last week and he bumped an object into the lipoma.  Pt denies drainage from the area or rashes. On exam the patient is afebrile nontoxic appearing. He has a proximally 7 x 4 cm mobile and nontender mass to his left hip. There are no overlying skin changes. No induration or erythema. No discharge. Exam is consistent with a lipoma. No evidence of abscess.  Will send patient to follow-up with general surgery for possible removal of this. Naproxen for pain control. I advised the patient to follow-up with their primary care provider this week. I advised the patient to return to the emergency department with new or worsening symptoms or new concerns. The patient verbalized understanding and agreement with plan.     Final Clinical Impressions(s) / ED Diagnoses   Final diagnoses:  Lipoma of torso    New Prescriptions New Prescriptions   NAPROXEN (NAPROSYN) 500 MG TABLET    Take 1 tablet (500 mg total) by mouth 2 (two) times daily with a meal.    I personally performed the services described in this documentation, which was scribed in my presence. The recorded information has been reviewed and is accurate.       Waynetta Pean, PA-C 12/20/16 [place], MD 12/20/16 [phone]

## 2016-12-20 NOTE — ED Triage Notes (Signed)
PResents with lipoma to left hip that has been aggravating him over the past 2-3 days- He has been here before for the same. He does not want to have it removed. Reports that he also has been having leg cramps. Wants a work note for the past 2 days as well.

## 2016-12-31 ENCOUNTER — Telehealth: Payer: Self-pay

## 2016-12-31 NOTE — Telephone Encounter (Signed)
Nurse called  Client to encourage him to get his  [facility] card and a referral to the free homeless dental clinic could  Be made. Client will call once he  Secures his  [facility] card. Client needs oral surgery to remove his  Bad teeth per local dentist .

## 2016-12-31 NOTE — Congregational Nurse Program (Signed)
Congregational Nurse Program Note  Date of Encounter: 12/25/2016  Past Medical History: Past Medical History:  Diagnosis Date  . Acute medial meniscal tear     Encounter Details:     CNP Questionnaire - 12/31/16 0012      Patient Demographics   Is this a new or existing patient? Existing   Patient is considered a/an Not Applicable   Race African-American/Black     Patient Assistance   Location of Patient Assistance Not Applicable   Patient's financial/insurance status Self-Pay (Uninsured)   Uninsured Patient ([facility] Oncologist) Yes   Interventions Appt. has been completed   Patient referred to apply for the following financial assistance [place] insecurities addressed Not Research scientist (physical sciences) No   Assistance securing medications No   Doctor, hospital the healthcare system;Acute disease     Encounter Details   Primary purpose of visit Spiritual Care/Support Visit;Acute Illness/Condition Visit;Education/Health Concerns;Navigating the Healthcare System   Was an Emergency Department visit averted? Not Applicable   Does patient have a medical provider? Yes   Patient referred to Follow up with established PCP   Was a mental health screening completed? (GAINS tool) No   Does patient have dental issues? Yes   Was a dental referral made? Yes   Does patient have vision issues? Yes   Was a vision referral made? Yes   Does your patient have an abnormal blood pressure today? No   Since previous encounter, have you referred patient for abnormal blood pressure that resulted in a new diagnosis or medication change? No   Does your patient have an abnormal blood glucose today? No   Since previous encounter, have you referred patient for abnormal blood glucose that resulted in a new diagnosis or medication change? No   Was there a life-saving intervention made? No         Clinical Intake - 12/04/16 1506      Pre-visit preparation   Pre-visit preparation completed Yes     Abuse/Neglect   Do you feel unsafe in your current relationship? No   Do you feel physically threatened by others? No   Anyone hurting you at home, work, or school? No   Unable to ask? No     Patient Literacy   How often do you need to have someone help you when you read instructions, pamphlets, or other written materials from your doctor or pharmacy? 1 - Never   What is the last grade level you completed in school? GED     Language Assistant   Interpreter Needed? No    Brief  viist with client to follow  Up on dental appointment . States dentist  Examined his  Mouth and told him he needs to be seen by a dental surgeon that he is unable to help him but see his  problems. Suggested he try to get into [employer] dentistry school. Nurse will look into referral to frre dental clinic for homeless to see what we can do to get him referred . Client is in constant dental pain.

## 2017-02-07 ENCOUNTER — Telehealth (INDEPENDENT_AMBULATORY_CARE_PROVIDER_SITE_OTHER): Payer: Self-pay | Admitting: *Deleted

## 2017-02-07 NOTE — Telephone Encounter (Signed)
Patient called at 10:50 am stated need to speak to Domenica Fail or come in for an appt today,   Informed patient we do not have appt for today,  Patient stated has been coughing and saw blood in his urine.  Informed patient needs to go to ER or Urgent  Per patient would like to talk to dr. Because urgent care request payment upfront to be seen

## 2017-02-07 NOTE — Telephone Encounter (Signed)
FWD to PCP. Randy Hansen, CMA  

## 2017-02-08 NOTE — Telephone Encounter (Signed)
I spoke to patient. Says he has not had hematuria today. Hematuria two days ago described as a "mist" when he first started urinating. Cough still lingers. Please schedule him on 02/11/17 (Tuesday). Thank you.

## 2017-02-11 ENCOUNTER — Ambulatory Visit (INDEPENDENT_AMBULATORY_CARE_PROVIDER_SITE_OTHER): Payer: Self-pay | Admitting: Physician Assistant

## 2017-02-11 ENCOUNTER — Encounter (INDEPENDENT_AMBULATORY_CARE_PROVIDER_SITE_OTHER): Payer: Self-pay | Admitting: Physician Assistant

## 2017-02-11 VITALS — BP 126/82 | HR 82 | Temp 97.8°F | Ht 68.5 in | Wt 217.8 lb

## 2017-02-11 DIAGNOSIS — Z114 Encounter for screening for human immunodeficiency virus [HIV]: Secondary | ICD-10-CM

## 2017-02-11 DIAGNOSIS — R0981 Nasal congestion: Secondary | ICD-10-CM

## 2017-02-11 DIAGNOSIS — K0889 Other specified disorders of teeth and supporting structures: Secondary | ICD-10-CM

## 2017-02-11 DIAGNOSIS — Z1159 Encounter for screening for other viral diseases: Secondary | ICD-10-CM

## 2017-02-11 DIAGNOSIS — Z0000 Encounter for general adult medical examination without abnormal findings: Secondary | ICD-10-CM

## 2017-02-11 DIAGNOSIS — Z1211 Encounter for screening for malignant neoplasm of colon: Secondary | ICD-10-CM

## 2017-02-11 DIAGNOSIS — Z23 Encounter for immunization: Secondary | ICD-10-CM

## 2017-02-11 MED ORDER — AMOXICILLIN-POT CLAVULANATE 875-125 MG PO TABS: 1.0000 | ORAL_TABLET | Freq: Two times a day (BID) | ORAL | 0 refills | Status: AC

## 2017-02-11 MED ORDER — DIPHENHYDRAMINE HCL 25 MG PO TABS: 25.0000 mg | ORAL_TABLET | Freq: Four times a day (QID) | ORAL | 0 refills | Status: DC | PRN

## 2017-02-11 MED ORDER — ACETAMINOPHEN 500 MG PO TABS: 1000.0000 mg | ORAL_TABLET | Freq: Three times a day (TID) | ORAL | 0 refills | Status: AC | PRN

## 2017-02-11 NOTE — Patient Instructions (Signed)
Colonoscopy, Adult A colonoscopy is an exam to look at the entire large intestine. During the exam, a lubricated, bendable tube is inserted into the anus and then passed into the rectum, colon, and other parts of the large intestine. A colonoscopy is often done as a part of normal colorectal screening or in response to certain symptoms, such as anemia, persistent diarrhea, abdominal pain, and blood in the stool. The exam can help screen for and diagnose medical problems, including:  Tumors.  Polyps.  Inflammation.  Areas of bleeding. Tell a health care provider about:  Any allergies you have.  All medicines you are taking, including vitamins, herbs, eye drops, creams, and over-the-counter medicines.  Any problems you or family members have had with anesthetic medicines.  Any blood disorders you have.  Any surgeries you have had.  Any medical conditions you have.  Any problems you have had passing stool. What are the risks? Generally, this is a safe procedure. However, problems may occur, including:  Bleeding.  A tear in the intestine.  A reaction to medicines given during the exam.  Infection (rare). What happens before the procedure? Eating and drinking restrictions  Follow instructions from your health care provider about eating and drinking, which may include:  A few days before the procedure - follow a low-fiber diet. Avoid nuts, seeds, dried fruit, raw fruits, and vegetables.  1-3 days before the procedure - follow a clear liquid diet. Drink only clear liquids, such as clear broth or bouillon, black coffee or tea, clear juice, clear soft drinks or sports drinks, gelatin dessert, and popsicles. Avoid any liquids that contain red or purple dye.  On the day of the procedure - do not eat or drink anything during the 2 hours before the procedure, or within the time period that your health care provider recommends. Bowel prep  If you were prescribed an oral bowel prep to  clean out your colon:  Take it as told by your health care provider. Starting the day before your procedure, you will need to drink a large amount of medicated liquid. The liquid will cause you to have multiple loose stools until your stool is almost clear or light green.  If your skin or anus gets irritated from diarrhea, you may use these to relieve the irritation:  Medicated wipes, such as adult wet wipes with aloe and vitamin E.  A skin soothing-product like petroleum jelly.  If you vomit while drinking the bowel prep, take a break for up to 60 minutes and then begin the bowel prep again. If vomiting continues and you cannot take the bowel prep without vomiting, call your health care provider. General instructions   Ask your health care provider about changing or stopping your regular medicines. This is especially important if you are taking diabetes medicines or blood thinners.  Plan to have someone take you home from the hospital or clinic. What happens during the procedure?  An IV tube may be inserted into one of your veins.  You will be given medicine to help you relax (sedative).  To reduce your risk of infection:  Your health care team will wash or sanitize their hands.  Your anal area will be washed with soap.  You will be asked to lie on your side with your knees bent.  Your health care provider will lubricate a long, thin, flexible tube. The tube will have a camera and a light on the end.  The tube will be inserted into your anus.    The tube will be gently eased through your rectum and colon.  Air will be delivered into your colon to keep it open. You may feel some pressure or cramping.  The camera will be used to take images during the procedure.  A small tissue sample may be removed from your body to be examined under a microscope (biopsy). If any potential problems are found, the tissue will be sent to a lab for testing.  If small polyps are found, your health  care provider may remove them and have them checked for cancer cells.  The tube that was inserted into your anus will be slowly removed. The procedure may vary among health care providers and hospitals. What happens after the procedure?  Your blood pressure, heart rate, breathing rate, and blood oxygen level will be monitored until the medicines you were given have worn off.  Do not drive for 24 hours after the exam.  You may have a small amount of blood in your stool.  You may pass gas and have mild abdominal cramping or bloating due to the air that was used to inflate your colon during the exam.  It is up to you to get the results of your procedure. Ask your health care provider, or the department performing the procedure, when your results will be ready. This information is not intended to replace advice given to you by your health care provider. Make sure you discuss any questions you have with your health care provider. Document Released: 09/06/2000 Document Revised: 07/10/2016 Document Reviewed: 11/21/2015 Elsevier Interactive Patient Education  2017 [employer]  

## 2017-02-11 NOTE — Progress Notes (Signed)
Subjective:  Patient ID: Randy Hansen, male    DOB: 1961-08-15  Age: 56 y.o. MRN: [id number]  CC:  Annual exam  HPI BRODRICK CURRAN is a 56 y.o. male with a PMH of meniscal tear presents for annual physical. He has addressed his previous concerns:  1. Went to mobile ophthalmology unit and was told his eyes were healthy except for the need for reading glasses. He was advised to pick up OTC eyeglasses.  2.He is currently working. 3. Went to Dentist. Was told that he has 10 teeth that need to be pulled. He needed surgery but did not have the orange card. Toothache persists.     Here today for annual physical exam and with concern for hematuria. Red mist at commencement of micturation a few days ago. No associated pain, dysuria, urinary frequency, urinary urgency, perineal pain. Also concerned with toothache and possible infection of teeth. Is working on getting his orange card so he may have dental assistance. Lastly, he is concerned with postnasal drip and clearing his throat throughout the day. Would like something to help reduce mucus production. Does not endorse any other symptoms or complaints.      Outpatient Medications Prior to Visit  Medication Sig Dispense Refill  . naproxen (NAPROSYN) 500 MG tablet Take 1 tablet (500 mg total) by mouth 2 (two) times daily with a meal. 30 tablet 0   No facility-administered medications prior to visit.      ROS Review of Systems  Constitutional: Negative for chills, fever and malaise/fatigue.  HENT: Positive for congestion. Negative for ear pain, sinus pain and sore throat.        Tooth ache  Eyes: Negative for blurred vision.  Respiratory: Negative for shortness of breath.   Cardiovascular: Negative for chest pain and palpitations.  Gastrointestinal: Negative for abdominal pain and nausea.  Genitourinary: Positive for hematuria. Negative for dysuria.  Musculoskeletal: Negative for joint pain and myalgias.  Skin: Negative for rash.   Neurological: Negative for tingling and headaches.  Psychiatric/Behavioral: Negative for depression. The patient is not nervous/anxious.     Objective:  BP 126/82   Pulse 82   Temp 97.8 F (36.6 C) (Oral)   Ht 5' 8.5" (1.74 m)   Wt 217 lb 12.8 oz (98.8 kg)   SpO2 97%   BMI 32.63 kg/m   BP/Weight 02/11/2017 12/20/2016 04/23/6552  Systolic BP 748 270 786  Diastolic BP 82 88 68  Wt. (Lbs) 217.8 - 224.2  BMI 32.63 - 31.27      Physical Exam  Constitutional: He is oriented to person, place, and time.  Well developed, overweight, NAD, polite  HENT:  Head: Normocephalic and atraumatic.  Mouth/Throat: No oropharyngeal exudate.  Extremely poor dentition. Mild postnasal drip.  Eyes: Conjunctivae and EOM are normal. Pupils are equal, round, and reactive to light. No scleral icterus.  Neck: Normal range of motion. Neck supple. No thyromegaly present.  Cardiovascular: Normal rate, regular rhythm and normal heart sounds.   Pulmonary/Chest: Effort normal and breath sounds normal.  Abdominal: Soft. Bowel sounds are normal. He exhibits no distension. There is no tenderness.  Genitourinary:  Genitourinary Comments: Penis and testicles normal. Prostate approximately 20-25 g, normal consistency, no nodules, no tenderness.  Musculoskeletal: He exhibits no edema or deformity.  UE and LE with full aROM. Back with full aROM.   Lymphadenopathy:    He has no cervical adenopathy.  Neurological: He is alert and oriented to person, place, and time. He has normal reflexes.  No cranial nerve deficit. Coordination normal.  Strength 5/5 throughout.  Skin: Skin is warm and dry. No rash noted. No erythema. No pallor.  Psychiatric: He has a normal mood and affect. His behavior is normal. Thought content normal.  Vitals reviewed.    Assessment & Plan:   1. Annual physical exam - CBC with Differential; Future - Comprehensive metabolic panel; Future - Lipid Panel; Future - PSA  2. Special screening  for malignant neoplasms, colon - Ambulatory referral to Gastroenterology  3. Need for hepatitis C screening test - Hepatitis c antibody (reflex)  4. Encounter for screening for HIV - HIV antibody  5. Need for Tdap vaccination - Administer Tdap vaccine greater than or equal to 7yo IM  6. Nasal congestion - Begin diphenhydrAMINE (BENADRYL) 25 MG tablet; Take 1 tablet (25 mg total) by mouth every 6 (six) hours as needed.  Dispense: 30 tablet; Refill: 0  7. Toothache - Begin amoxicillin-clavulanate (AUGMENTIN) 875-125 MG tablet; Take 1 tablet by mouth 2 (two) times daily.  Dispense: 20 tablet; Refill: 0 - Begin Tylenol 500 mg   Meds ordered this encounter  Medications  . diphenhydrAMINE (BENADRYL) 25 MG tablet    Sig: Take 1 tablet (25 mg total) by mouth every 6 (six) hours as needed.    Dispense:  30 tablet    Refill:  0    Order Specific Question:   Supervising Provider    Answer:   Tresa Garter [id number]  . amoxicillin-clavulanate (AUGMENTIN) 875-125 MG tablet    Sig: Take 1 tablet by mouth 2 (two) times daily.    Dispense:  20 tablet    Refill:  0    Order Specific Question:   Supervising Provider    Answer:   Tresa Garter [id number]    Follow-up: Return if symptoms worsen or fail to improve.   Clent Demark PA

## 2017-02-12 ENCOUNTER — Other Ambulatory Visit (INDEPENDENT_AMBULATORY_CARE_PROVIDER_SITE_OTHER): Payer: Self-pay | Admitting: Physician Assistant

## 2017-02-12 DIAGNOSIS — R972 Elevated prostate specific antigen [PSA]: Secondary | ICD-10-CM

## 2017-02-12 LAB — HIV ANTIBODY (ROUTINE TESTING W REFLEX): HIV Screen 4th Generation wRfx: NONREACTIVE

## 2017-02-12 LAB — PSA: Prostate Specific Ag, Serum: 4.4 ng/mL — ABNORMAL HIGH (ref 0.0–4.0)

## 2017-02-12 LAB — HCV COMMENT:

## 2017-02-12 LAB — HEPATITIS C ANTIBODY (REFLEX): HCV Ab: <0.1 s/co ratio (ref 0.0–0.9)

## 2017-02-12 NOTE — Progress Notes (Signed)
Elevated PSA 4.4

## 2017-02-28 ENCOUNTER — Ambulatory Visit (INDEPENDENT_AMBULATORY_CARE_PROVIDER_SITE_OTHER): Payer: Self-pay

## 2017-02-28 ENCOUNTER — Other Ambulatory Visit (INDEPENDENT_AMBULATORY_CARE_PROVIDER_SITE_OTHER): Payer: Self-pay | Admitting: Physician Assistant

## 2017-02-28 ENCOUNTER — Other Ambulatory Visit (INDEPENDENT_AMBULATORY_CARE_PROVIDER_SITE_OTHER): Payer: Self-pay

## 2017-02-28 ENCOUNTER — Telehealth (INDEPENDENT_AMBULATORY_CARE_PROVIDER_SITE_OTHER): Payer: Self-pay | Admitting: Physician Assistant

## 2017-02-28 DIAGNOSIS — Z0000 Encounter for general adult medical examination without abnormal findings: Secondary | ICD-10-CM

## 2017-02-28 DIAGNOSIS — D1724 Benign lipomatous neoplasm of skin and subcutaneous tissue of left leg: Secondary | ICD-10-CM

## 2017-02-28 MED FILL — AMOX-CLAV 875-125 MG TABLET: 875-125 | 10 days supply | Qty: 20 | Fill #0

## 2017-02-28 NOTE — Progress Notes (Signed)
Pt comes in with urgent need to speak to me. Turns out he is looking to be excused from work for yesterday and today. 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 Communicates having issues with his [employer]. I have asked that he schedule appointment to speak to me about his medical issues.

## 2017-02-28 NOTE — Telephone Encounter (Signed)
Patient called stated he needs to be seen today and Domenica Fail PA already know his history, he has glaucoma  and if he can not see him today, patient would like to speak to  Domenica Fail PA over the phone.  Please call patient

## 2017-02-28 NOTE — Telephone Encounter (Signed)
Already addressed

## 2017-02-28 NOTE — Telephone Encounter (Signed)
FWD to PCP. Lenor Provencher S Trinna Kunst, CMA  

## 2017-03-01 LAB — CBC WITH DIFFERENTIAL/PLATELET
Basophils Absolute: 0 10*3/uL (ref 0.0–0.2)
Basos: 0 %
EOS (ABSOLUTE): 0.2 10*3/uL (ref 0.0–0.4)
EOS: 3 %
HEMATOCRIT: 50.2 % (ref 37.5–51.0)
Hemoglobin: 16.8 g/dL (ref 13.0–17.7)
IMMATURE GRANS (ABS): 0 10*3/uL (ref 0.0–0.1)
Immature Granulocytes: 0 %
LYMPHS: 26 %
Lymphocytes Absolute: 2 10*3/uL (ref 0.7–3.1)
MCH: 31.2 pg (ref 26.6–33.0)
MCHC: 33.5 g/dL (ref 31.5–35.7)
MCV: 93 fL (ref 79–97)
Monocytes Absolute: 0.8 10*3/uL (ref 0.1–0.9)
Monocytes: 11 %
NEUTROS PCT: 60 %
Neutrophils Absolute: 4.4 10*3/uL (ref 1.4–7.0)
PLATELETS: 322 10*3/uL (ref 150–379)
RBC: 5.39 x10E6/uL (ref 4.14–5.80)
RDW: 13.3 % (ref 12.3–15.4)
WBC: 7.4 10*3/uL (ref 3.4–10.8)

## 2017-03-01 LAB — LIPID PANEL
CHOL/HDL RATIO: 2.5 ratio (ref 0.0–5.0)
Cholesterol, Total: 206 mg/dL — ABNORMAL HIGH (ref 100–199)
HDL: 81 mg/dL (ref >39)
LDL CALC: 87 mg/dL (ref 0–99)
Triglycerides: 192 mg/dL — ABNORMAL HIGH (ref 0–149)
VLDL Cholesterol Cal: 38 mg/dL (ref 5–40)

## 2017-03-01 LAB — COMPREHENSIVE METABOLIC PANEL
A/G RATIO: 1.9 (ref 1.2–2.2)
ALT: 36 IU/L (ref 0–44)
AST: 31 IU/L (ref 0–40)
Albumin: 4.9 g/dL (ref 3.5–5.5)
Alkaline Phosphatase: 110 IU/L (ref 39–117)
BUN/Creatinine Ratio: 10 (ref 9–20)
BUN: 14 mg/dL (ref 6–24)
Bilirubin Total: 0.5 mg/dL (ref 0.0–1.2)
CALCIUM: 10.2 mg/dL (ref 8.7–10.2)
CO2: 24 mmol/L (ref 18–29)
Chloride: 102 mmol/L (ref 96–106)
Creatinine, Ser: 1.38 mg/dL — ABNORMAL HIGH (ref 0.76–1.27)
GFR, EST AFRICAN AMERICAN: 67 mL/min/{1.73_m2} (ref >59)
GFR, EST NON AFRICAN AMERICAN: 58 mL/min/{1.73_m2} — AB (ref >59)
GLOBULIN, TOTAL: 2.6 g/dL (ref 1.5–4.5)
Glucose: 113 mg/dL — ABNORMAL HIGH (ref 65–99)
POTASSIUM: 4.4 mmol/L (ref 3.5–5.2)
Sodium: 143 mmol/L (ref 134–144)
TOTAL PROTEIN: 7.5 g/dL (ref 6.0–8.5)

## 2017-04-18 ENCOUNTER — Ambulatory Visit (INDEPENDENT_AMBULATORY_CARE_PROVIDER_SITE_OTHER): Payer: Self-pay

## 2017-05-02 ENCOUNTER — Ambulatory Visit: Payer: Self-pay

## 2017-05-12 ENCOUNTER — Ambulatory Visit: Payer: Self-pay

## 2017-05-22 ENCOUNTER — Telehealth (INDEPENDENT_AMBULATORY_CARE_PROVIDER_SITE_OTHER): Payer: Self-pay | Admitting: Physician Assistant

## 2017-05-22 NOTE — Telephone Encounter (Signed)
I called pt since he left message to call him back, he has been approve for CAFA and OC cna you please start working on the referral, thanks

## 2017-05-23 NOTE — Telephone Encounter (Signed)
Referrals already been done  See Referral Notes

## 2017-05-28 ENCOUNTER — Ambulatory Visit: Payer: Self-pay

## 2017-06-17 ENCOUNTER — Other Ambulatory Visit (INDEPENDENT_AMBULATORY_CARE_PROVIDER_SITE_OTHER): Payer: Self-pay | Admitting: Physician Assistant

## 2017-06-17 ENCOUNTER — Telehealth (INDEPENDENT_AMBULATORY_CARE_PROVIDER_SITE_OTHER): Payer: Self-pay | Admitting: Physician Assistant

## 2017-06-17 DIAGNOSIS — R972 Elevated prostate specific antigen [PSA]: Secondary | ICD-10-CM

## 2017-06-17 DIAGNOSIS — H547 Unspecified visual loss: Secondary | ICD-10-CM

## 2017-06-17 NOTE — Telephone Encounter (Signed)
Pt called since he has been approve for the [facility] discount and the orange card, he want you to please sent all the referral back to the specialist, he want you to start with the ophthalmology first, please follow up

## 2017-06-17 NOTE — Telephone Encounter (Signed)
I have made the referrals to ophthalmology and urology.

## 2017-06-17 NOTE — Telephone Encounter (Signed)
FWD to PCP. Tammela Bales S Suri Tafolla, CMA  

## 2017-06-18 NOTE — Telephone Encounter (Signed)
Left a message informing patient that both referrals have been placed. Nat Christen, CMA

## 2017-06-20 ENCOUNTER — Emergency Department (HOSPITAL_COMMUNITY): Payer: Self-pay

## 2017-06-20 ENCOUNTER — Emergency Department (HOSPITAL_COMMUNITY)
Admission: EM | Admit: 2017-06-20 | Discharge: 2017-06-21 | Disposition: A | Discharge status: Home / Self Care | Payer: Self-pay | Attending: Emergency Medicine | Admitting: Emergency Medicine

## 2017-06-20 ENCOUNTER — Encounter (HOSPITAL_COMMUNITY): Payer: Self-pay | Admitting: *Deleted

## 2017-06-20 DIAGNOSIS — E279 Disorder of adrenal gland, unspecified: Secondary | ICD-10-CM | POA: Insufficient documentation

## 2017-06-20 DIAGNOSIS — Z79899 Other long term (current) drug therapy: Secondary | ICD-10-CM | POA: Insufficient documentation

## 2017-06-20 DIAGNOSIS — E278 Other specified disorders of adrenal gland: Secondary | ICD-10-CM

## 2017-06-20 DIAGNOSIS — F17210 Nicotine dependence, cigarettes, uncomplicated: Secondary | ICD-10-CM | POA: Insufficient documentation

## 2017-06-20 DIAGNOSIS — R319 Hematuria, unspecified: Secondary | ICD-10-CM | POA: Insufficient documentation

## 2017-06-20 DIAGNOSIS — R109 Unspecified abdominal pain: Secondary | ICD-10-CM | POA: Insufficient documentation

## 2017-06-20 LAB — URINALYSIS, ROUTINE W REFLEX MICROSCOPIC
Bacteria, UA: NONE SEEN
Bilirubin Urine: NEGATIVE
Glucose, UA: NEGATIVE mg/dL
Hgb urine dipstick: NEGATIVE
KETONES UR: NEGATIVE mg/dL
Leukocytes, UA: NEGATIVE
Nitrite: NEGATIVE
PH: 5 (ref 5.0–8.0)
PROTEIN: 30 mg/dL — AB
Specific Gravity, Urine: 1.024 (ref 1.005–1.030)

## 2017-06-20 LAB — BASIC METABOLIC PANEL
Anion gap: 10 (ref 5–15)
BUN: 8 mg/dL (ref 6–20)
CO2: 22 mmol/L (ref 22–32)
Calcium: 9.6 mg/dL (ref 8.9–10.3)
Chloride: 106 mmol/L (ref 101–111)
Creatinine, Ser: 1.19 mg/dL (ref 0.61–1.24)
GFR calc Af Amer: >60 mL/min (ref >60)
GFR calc non Af Amer: >60 mL/min (ref >60)
Glucose, Bld: 99 mg/dL (ref 65–99)
Potassium: 3.6 mmol/L (ref 3.5–5.1)
Sodium: 138 mmol/L (ref 135–145)

## 2017-06-20 LAB — TROPONIN I: Troponin I: <0.03 ng/mL (ref <0.03)

## 2017-06-20 LAB — CBC
HCT: 45.7 % (ref 39.0–52.0)
Hemoglobin: 15.7 g/dL (ref 13.0–17.0)
MCH: 30.7 pg (ref 26.0–34.0)
MCHC: 34.4 g/dL (ref 30.0–36.0)
MCV: 89.3 fL (ref 78.0–100.0)
Platelets: 299 10*3/uL (ref 150–400)
RBC: 5.12 MIL/uL (ref 4.22–5.81)
RDW: 12.1 % (ref 11.5–15.5)
WBC: 8.4 10*3/uL (ref 4.0–10.5)

## 2017-06-20 MED ORDER — KETOROLAC TROMETHAMINE 30 MG/ML IJ SOLN: 30.0000 mg | Freq: Once | INTRAMUSCULAR | Status: DC

## 2017-06-20 MED ORDER — NAPROXEN 500 MG PO TABS: 500.0000 mg | ORAL_TABLET | Freq: Two times a day (BID) | ORAL | 0 refills | Status: DC | PRN

## 2017-06-20 MED ORDER — KETOROLAC TROMETHAMINE 60 MG/2ML IM SOLN
60.0000 mg | Freq: Once | INTRAMUSCULAR | Status: AC
  Administered 2017-06-20: 60 mg via INTRAMUSCULAR
  Filled 2017-06-20: qty 2

## 2017-06-20 MED ORDER — SODIUM CHLORIDE 0.9 % IV BOLUS (SEPSIS)
1000.0000 mL | Freq: Once | INTRAVENOUS | Status: AC
  Administered 2017-06-20: 1000 mL via INTRAVENOUS

## 2017-06-20 MED ORDER — SODIUM CHLORIDE 0.9 % IV BOLUS (SEPSIS): 1000.0000 mL | Freq: Once | INTRAVENOUS | Status: DC

## 2017-06-20 MED ORDER — IOPAMIDOL (ISOVUE-300) INJECTION 61%
INTRAVENOUS | Status: AC
  Administered 2017-06-20: 100 mL
  Filled 2017-06-20: qty 100

## 2017-06-20 NOTE — ED Provider Notes (Signed)
Care assumed from previous provider PA Joy. Please see note for further details. Case discussed, plan agreed upon. Will follow up on pending CT abd/pelvis. If negative, safe for discharge with symptomatic management.  CT shows no explanation for left flank pain. Incidental right adrenal mass noted. Patient informed of incidental finding and agrees to follow up with PCP for further work up. Evaluation does not show pathology that would require ongoing emergent intervention or inpatient treatment. Discussed symptomatic home care and return precautions. All questions answered.    Yury Schaus, Ozella Almond, PA-C 06/21/17 0012    Virgel Manifold, MD 07/02/17 [phone]

## 2017-06-20 NOTE — ED Triage Notes (Signed)
The pt  Is c/o pain all over his body since yesterday with a headache chest pain  Cramps all over his body

## 2017-06-20 NOTE — ED Notes (Signed)
Patient transported to CT 

## 2017-06-20 NOTE — ED Provider Notes (Signed)
[facility] DEPT Provider Note   CSN: [id number] Arrival date & time: 06/20/17  1501     History   Chief Complaint Chief Complaint  Patient presents with  . Generalized Body Aches    HPI Randy Hansen is a 56 y.o. male.  HPI   Randy Hansen is a 56 y.o. male, patient with no pertinent past medical history, presenting to the ED with left flank pain intermittent since last night. Last night the pain was sharp, but now dull and achy. Rates it 8/10, radiates to left lower back. Also endorses nausea, diarrhea, muscle cramps, and hematuria last night. These additional symptoms have resolved.  Denies fever/chills, vomiting, hematochezia/melena, abdominal pain, chest pain, shortness of breath, dysuria, penile discharge, or any other complaints.        Past Medical History:  Diagnosis Date  . Acute medial meniscal tear     There are no active problems to display for this patient.   Past Surgical History:  Procedure Laterality Date  . NO PAST SURGERIES         Home Medications    Prior to Admission medications   Medication Sig Start Date End Date Taking? Authorizing Provider  diphenhydrAMINE (BENADRYL) 25 MG tablet Take 1 tablet (25 mg total) by mouth every 6 (six) hours as needed. 02/11/17   Clent Demark, PA-C  naproxen (NAPROSYN) 500 MG tablet Take 1 tablet (500 mg total) by mouth 2 (two) times daily with a meal. 12/20/16   Waynetta Pean, PA-C    Family History No family history on file.  Social History Social History  Substance Use Topics  . Smoking status: Current Every Day Smoker    Packs/day: 0.50    Types: Cigarettes  . Smokeless tobacco: Current User  . Alcohol use 7.2 oz/week    12 Cans of beer per week     Comment: social     Allergies   Patient has no known allergies.   Review of Systems Review of Systems  Constitutional: Negative for chills, diaphoresis and fever.  Respiratory: Negative for shortness of breath.     Cardiovascular: Negative for chest pain.  Gastrointestinal: Positive for diarrhea (resolved) and nausea. Negative for abdominal pain, blood in stool and vomiting.  Genitourinary: Positive for flank pain and hematuria.  Musculoskeletal: Positive for myalgias (resolved).  All other systems reviewed and are negative.    Physical Exam Updated Vital Signs BP (!) 143/94 (BP Location: Left Arm)   Pulse (!) 109   Temp 98.3 F (36.8 C) (Oral)   Resp 18   Ht 5\' 6"  (1.676 m)   Wt 99.8 kg (220 lb)   SpO2 98%   BMI 35.51 kg/m   Physical Exam  Constitutional: He appears well-developed and well-nourished. No distress.  HENT:  Head: Normocephalic and atraumatic.  Eyes: Conjunctivae are normal.  Neck: Neck supple.  Cardiovascular: Normal rate, regular rhythm, normal heart sounds and intact distal pulses.   Pulmonary/Chest: Effort normal and breath sounds normal. No respiratory distress.  Abdominal: Soft. There is no tenderness. There is CVA tenderness (left). There is no guarding.    Tenderness to left flank  Musculoskeletal: He exhibits no edema.  Lymphadenopathy:    He has no cervical adenopathy.  Neurological: He is alert.  Skin: Skin is warm and dry. He is not diaphoretic.  Psychiatric: He has a normal mood and affect. His behavior is normal.  Nursing note and vitals reviewed.    ED Treatments / Results  Labs (all  labs ordered are listed, but only abnormal results are displayed) Labs Reviewed  URINALYSIS, ROUTINE W REFLEX MICROSCOPIC - Abnormal; Notable for the following:       Result Value   Protein, ur 30 (*)    Squamous Epithelial / LPF 0-5 (*)    All other components within normal limits  BASIC METABOLIC PANEL  CBC  TROPONIN I    EKG  EKG Interpretation None       Radiology Dg Chest 2 View  Result Date: 06/20/2017 CLINICAL DATA:  Generalized chest pain, body aches since yesterday. Smoker. EXAM: CHEST  2 VIEW COMPARISON:  Chest radiograph February 28, 2009  FINDINGS: Cardiomediastinal silhouette is normal. No pleural effusions or focal consolidations. Trachea projects midline and there is no pneumothorax. Soft tissue planes and included osseous structures are non-suspicious. Punctate potential loose body LEFT shoulder. IMPRESSION: Stable negative chest. Electronically Signed   By: Elon Alas M.D.   On: 06/20/2017 15:56    Procedures Procedures (including critical care time)  Medications Ordered in ED Medications  ketorolac (TORADOL) injection 60 mg (60 mg Intramuscular Given 06/20/17 1930)     Initial Impression / Assessment and Plan / ED Course  I have reviewed the triage vital signs and the nursing notes.  Pertinent labs & imaging results that were available during my care of the patient were reviewed by me and considered in my medical decision making (see chart for details).     Patient presents with left flank pain with associated tenderness. Differential includes renal stone, shingles pre-eruption pain, but AAA was also considered, which influenced the choice of imaging study. Patient is nontoxic appearing, afebrile, not tachycardic on my exam, not tachypneic, not hypotensive, maintains SPO2 of 98-100% on room air, and is in no apparent distress. No leukocytosis.  Findings and plan of care discussed with Virgel Manifold, MD.  End of shift patient care handoff report given to [facility], PA-C. Plan: CT abdomen/pelvis pending. Review and treat patient accordingly; symptomatically if negative.    Vitals:   06/20/17 1510 06/20/17 1513 06/20/17 1830  BP: (!) 143/94  (!) 153/95  Pulse: (!) 109  90  Resp: 18  20  Temp: 98.3 F (36.8 C)    TempSrc: Oral    SpO2: 98%  100%  Weight:  99.8 kg (220 lb)   Height:  5\' 6"  (1.676 m)       Final Clinical Impressions(s) / ED Diagnoses   Final diagnoses:  None    New Prescriptions New Prescriptions   No medications on file     Layla Maw 06/20/17 2025    Virgel Manifold, MD 07/02/17 [phone]

## 2017-06-20 NOTE — ED Notes (Signed)
Pt has lipoma on the left side near pants line

## 2017-06-21 NOTE — Discharge Instructions (Signed)
It was my pleasure taking care of you today!   Fortunately, your lab work and imaging was very reassuring. Your CT did show a mass on your right adrenal gland. This is unlikely to be the source of your pain today, however, it is VERY important that you follow up with a primary care doctor for further work up about this finding. If you do not have a primary care doctor, see the information below for places to call.   At this time, there does not appear to be an acute, emergent cause for your abdominal pain. However, this does not mean that your abdominal pain may not become an emergency in the future. It is VERY important that you monitor your symptoms and return to the Emergency Department if you develop any of the following symptoms:  The pain does not go away.  You have a fever.  You keep throwing up and can't keep fluids down. You pass bloody or black tarry stools.  There is bright red blood in the stool. You do not seem to be getting better.  You have any questions or concerns.    To find a primary care or specialty doctor please call [phone] or [phone] to access "[facility]."  You may also go on the [facility] website at [email or web address]  There are also multiple [facility] and [facility] practices throughout the [place] that are frequently accepting new patients. You may find a clinic that is close to your home and contact them.  [facility] [address] [place] [address] [phone]  [facility] in [place] (also locations in [place] and [place]) - [place] [phone]  [place] [phone]

## 2017-06-23 MED FILL — AMOXICILLIN 500 MG CAPSULE: 500 | 7 days supply | Qty: 21 | Fill #0

## 2017-06-27 ENCOUNTER — Encounter (INDEPENDENT_AMBULATORY_CARE_PROVIDER_SITE_OTHER): Payer: Self-pay | Admitting: Physician Assistant

## 2017-06-27 ENCOUNTER — Ambulatory Visit (INDEPENDENT_AMBULATORY_CARE_PROVIDER_SITE_OTHER): Payer: Self-pay | Admitting: Physician Assistant

## 2017-06-27 VITALS — BP 141/87 | HR 78 | Temp 98.3°F | Wt 229.2 lb

## 2017-06-27 DIAGNOSIS — E279 Disorder of adrenal gland, unspecified: Secondary | ICD-10-CM

## 2017-06-27 DIAGNOSIS — R51 Headache: Secondary | ICD-10-CM

## 2017-06-27 DIAGNOSIS — R112 Nausea with vomiting, unspecified: Secondary | ICD-10-CM

## 2017-06-27 DIAGNOSIS — R519 Headache, unspecified: Secondary | ICD-10-CM

## 2017-06-27 DIAGNOSIS — E278 Other specified disorders of adrenal gland: Secondary | ICD-10-CM

## 2017-06-27 MED ORDER — ACETAMINOPHEN 500 MG PO TABS: 1000.0000 mg | ORAL_TABLET | Freq: Three times a day (TID) | ORAL | 0 refills | Status: AC | PRN

## 2017-06-27 NOTE — Progress Notes (Signed)
Subjective:  Patient ID: Randy Hansen, male    DOB: 08-09-1961  Age: 56 y.o. MRN: [id number]  CC: Renal   HPI Randy Hansen is a 56 y.o. male with a medical history of acute medial meniscal tear presents to discuss incidental finding of right adrenal mass found on CT. Adrenal mass is 3.5 cm. Needs further imaging to help characterize the mass. No hematuria, back pain, f/c/n/v, fatigue, or rash.    Patient did not go to work yesterday because he was so worried about what the mass could be. Needs a work excuse for yesterday. Was told he would lose his job if he returns without a note.      Outpatient Medications Prior to Visit  Medication Sig Dispense Refill  . aspirin 325 MG tablet Take 325 mg by mouth every 6 (six) hours as needed for mild pain.    . diphenhydrAMINE (BENADRYL) 25 MG tablet Take 1 tablet (25 mg total) by mouth every 6 (six) hours as needed. (Patient not taking: Reported on 06/27/2017) 30 tablet 0  . naproxen (NAPROSYN) 500 MG tablet Take 1 tablet (500 mg total) by mouth 2 (two) times daily as needed for moderate pain. (Patient not taking: Reported on 06/27/2017) 15 tablet 0   No facility-administered medications prior to visit.      ROS Review of Systems  Constitutional: Negative for chills, fever and malaise/fatigue.  Eyes: Negative for blurred vision.  Respiratory: Negative for shortness of breath.   Cardiovascular: Negative for chest pain and palpitations.  Gastrointestinal: Negative for abdominal pain and nausea.  Genitourinary: Negative for dysuria and hematuria.  Musculoskeletal: Negative for joint pain and myalgias.  Skin: Negative for rash.  Neurological: Negative for tingling and headaches.  Psychiatric/Behavioral: Negative for depression. The patient is not nervous/anxious.     Objective:  BP (!) 141/87 (BP Location: Left Arm, Patient Position: Sitting, Cuff Size: Large)   Pulse 78   Temp 98.3 F (36.8 C) (Oral)   Wt 229 lb 3.2 oz (104 kg)    SpO2 95%   BMI 36.99 kg/m   BP/Weight 06/27/2017 06/20/2017 2/87/8676  Systolic BP 720 947 096  Diastolic BP 87 97 82  Wt. (Lbs) 229.2 220 217.8  BMI 36.99 35.51 32.63      Physical Exam  Constitutional: He is oriented to person, place, and time.  Well developed, well nourished, NAD, polite  HENT:  Head: Normocephalic and atraumatic.  Eyes: No scleral icterus.  Neck: Normal range of motion. Neck supple. No thyromegaly present.  Cardiovascular: Normal rate, regular rhythm and normal heart sounds.   Pulmonary/Chest: Effort normal and breath sounds normal.  Abdominal: Soft. Bowel sounds are normal. There is no tenderness.  Musculoskeletal: He exhibits no edema.  Neurological: He is alert and oriented to person, place, and time.  Skin: Skin is warm and dry. No rash noted. No erythema. No pallor.  Psychiatric: He has a normal mood and affect. His behavior is normal. Thought content normal.  Vitals reviewed.    Assessment & Plan:   1. Right adrenal mass (HCC) - CT abdomen with and without contrast - Consulted with radiologist to order proper imaging.  2. Nonintractable headache, unspecified chronicity pattern, unspecified headache type - Tylenol 500 mg, two tablets, TID.  3. Nausea and vomiting, intractability of vomiting not specified, unspecified vomiting type - Resolved. Food born vs Augmentin use?    Follow-up: Return in about 2 weeks (around 07/11/2017).   Clent Demark PA

## 2017-06-27 NOTE — Patient Instructions (Signed)
General Headache Without Cause A headache is pain or discomfort felt around the head or neck area. The specific cause of a headache may not be found. There are many causes and types of headaches. A few common ones are:  Tension headaches.  Migraine headaches.  Cluster headaches.  Chronic daily headaches.  Follow these instructions at home: Watch your condition for any changes. Take these steps to help with your condition: Managing pain  Take over-the-counter and prescription medicines only as told by your health care provider.  Lie down in a dark, quiet room when you have a headache.  If directed, apply ice to the head and neck area: ? Put ice in a plastic bag. ? Place a towel between your skin and the bag. ? Leave the ice on for 20 minutes, 2-3 times per day.  Use a heating pad or hot shower to apply heat to the head and neck area as told by your health care provider.  Keep lights dim if bright lights bother you or make your headaches worse. Eating and drinking  Eat meals on a regular schedule.  Limit alcohol use.  Decrease the amount of caffeine you drink, or stop drinking caffeine. General instructions  Keep all follow-up visits as told by your health care provider. This is important.  Keep a headache journal to help find out what may trigger your headaches. For example, write down: ? What you eat and drink. ? How much sleep you get. ? Any change to your diet or medicines.  Try massage or other relaxation techniques.  Limit stress.  Sit up straight, and do not tense your muscles.  Do not use tobacco products, including cigarettes, chewing tobacco, or e-cigarettes. If you need help quitting, ask your health care provider.  Exercise regularly as told by your health care provider.  Sleep on a regular schedule. Get 7-9 hours of sleep, or the amount recommended by your health care provider. Contact a health care provider if:  Your symptoms are not helped by  medicine.  You have a headache that is different from the usual headache.  You have nausea or you vomit.  You have a fever. Get help right away if:  Your headache becomes severe.  You have repeated vomiting.  You have a stiff neck.  You have a loss of vision.  You have problems with speech.  You have pain in the eye or ear.  You have muscular weakness or loss of muscle control.  You lose your balance or have trouble walking.  You feel faint or pass out.  You have confusion. This information is not intended to replace advice given to you by your health care provider. Make sure you discuss any questions you have with your health care provider. Document Released: 09/09/2005 Document Revised: 02/15/2016 Document Reviewed: 01/02/2015 Elsevier Interactive Patient Education  2017 Elsevier Inc.  

## 2017-06-30 ENCOUNTER — Telehealth (INDEPENDENT_AMBULATORY_CARE_PROVIDER_SITE_OTHER): Payer: Self-pay | Admitting: Physician Assistant

## 2017-06-30 NOTE — Telephone Encounter (Signed)
Please let Mr. Sinning know that I called and left message to Mrs. Drue Novel stating that he does not have any restrictions.

## 2017-06-30 NOTE — Telephone Encounter (Signed)
Patient aware that PCP left message with Ms. Maudry Mayhew, and patient confirmed that Ms. Maudry Mayhew received the message. Nat Christen, CMA

## 2017-06-30 NOTE — Telephone Encounter (Signed)
FWD to PCP. Tempestt S Roberts, CMA  

## 2017-06-30 NOTE — Telephone Encounter (Signed)
Mr Kaveon Blatz regarding the letter that PA Altamease Oiler wrote to him on his recent visit he took the letter to his job and human resources  Declined his letter because it need to say No restrictions . Mr Burich asking if PA Altamease Oiler or nurse can call Mrs Drue Novel [phone] and tell he don't have restrictions so he can go to work tonight thank you

## 2017-07-01 ENCOUNTER — Inpatient Hospital Stay (INDEPENDENT_AMBULATORY_CARE_PROVIDER_SITE_OTHER): Payer: Self-pay | Admitting: Physician Assistant

## 2017-07-02 ENCOUNTER — Ambulatory Visit (HOSPITAL_COMMUNITY): Payer: Self-pay | Attending: Physician Assistant

## 2017-07-11 ENCOUNTER — Ambulatory Visit (INDEPENDENT_AMBULATORY_CARE_PROVIDER_SITE_OTHER): Payer: Self-pay | Admitting: Physician Assistant

## 2017-07-14 ENCOUNTER — Encounter (HOSPITAL_COMMUNITY): Payer: Self-pay | Admitting: Emergency Medicine

## 2017-07-14 ENCOUNTER — Emergency Department (HOSPITAL_COMMUNITY)
Admission: EM | Admit: 2017-07-14 | Discharge: 2017-07-14 | Disposition: A | Discharge status: Home / Self Care | Payer: Self-pay | Attending: Emergency Medicine | Admitting: Emergency Medicine

## 2017-07-14 ENCOUNTER — Ambulatory Visit (HOSPITAL_COMMUNITY): Admission: RE | Admit: 2017-07-14 | Payer: Self-pay | Source: Ambulatory Visit

## 2017-07-14 DIAGNOSIS — M545 Low back pain, unspecified: Secondary | ICD-10-CM

## 2017-07-14 DIAGNOSIS — Z7982 Long term (current) use of aspirin: Secondary | ICD-10-CM | POA: Insufficient documentation

## 2017-07-14 DIAGNOSIS — S8991XA Unspecified injury of right lower leg, initial encounter: Secondary | ICD-10-CM | POA: Insufficient documentation

## 2017-07-14 DIAGNOSIS — Y999 Unspecified external cause status: Secondary | ICD-10-CM | POA: Insufficient documentation

## 2017-07-14 DIAGNOSIS — F17210 Nicotine dependence, cigarettes, uncomplicated: Secondary | ICD-10-CM | POA: Insufficient documentation

## 2017-07-14 DIAGNOSIS — Y9389 Activity, other specified: Secondary | ICD-10-CM | POA: Insufficient documentation

## 2017-07-14 DIAGNOSIS — Y92410 Unspecified street and highway as the place of occurrence of the external cause: Secondary | ICD-10-CM | POA: Insufficient documentation

## 2017-07-14 MED ORDER — IBUPROFEN 400 MG PO TABS
ORAL_TABLET | ORAL | Status: DC
Start: 2017-07-14 — End: 2017-07-15
  Filled 2017-07-14: qty 1

## 2017-07-14 MED ORDER — IBUPROFEN 400 MG PO TABS
400.0000 mg | ORAL_TABLET | Freq: Once | ORAL | Status: AC | PRN
  Administered 2017-07-14: 400 mg via ORAL
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
## 2017-07-14 NOTE — Discharge Instructions (Signed)
For pain, use ibuprofen 3 times a day.  Also use heat on the sore areas 3 or 4 times a day.  Follow-up with the doctor of your choice if not better after 1 week.

## 2017-07-15 MED FILL — AMOXICILLIN 500 MG CAPSULE: 500 | 7 days supply | Qty: 21 | Fill #0

## 2017-07-24 ENCOUNTER — Emergency Department (HOSPITAL_COMMUNITY)
Admission: EM | Admit: 2017-07-24 | Discharge: 2017-07-24 | Disposition: A | Discharge status: Home / Self Care | Payer: Self-pay | Attending: Emergency Medicine | Admitting: Emergency Medicine

## 2017-07-24 ENCOUNTER — Emergency Department (HOSPITAL_COMMUNITY): Payer: Self-pay

## 2017-07-24 ENCOUNTER — Encounter (HOSPITAL_COMMUNITY): Payer: Self-pay | Admitting: Nurse Practitioner

## 2017-07-24 DIAGNOSIS — Z041 Encounter for examination and observation following transport accident: Secondary | ICD-10-CM | POA: Insufficient documentation

## 2017-07-24 DIAGNOSIS — M7918 Myalgia, other site: Secondary | ICD-10-CM | POA: Insufficient documentation

## 2017-07-24 DIAGNOSIS — M545 Low back pain: Secondary | ICD-10-CM | POA: Insufficient documentation

## 2017-07-24 DIAGNOSIS — F17210 Nicotine dependence, cigarettes, uncomplicated: Secondary | ICD-10-CM | POA: Insufficient documentation
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
## 2017-07-24 NOTE — Discharge Instructions (Signed)
Please read attached information. If you experience any new or worsening signs or symptoms please return to the emergency room for evaluation. Please follow-up with your primary care provider or specialist as discussed.  °
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
## 2017-07-25 ENCOUNTER — Ambulatory Visit (HOSPITAL_COMMUNITY): Admission: RE | Admit: 2017-07-25 | Payer: Self-pay | Source: Ambulatory Visit

## 2017-07-28 ENCOUNTER — Ambulatory Visit (HOSPITAL_COMMUNITY)
Admission: RE | Admit: 2017-07-28 | Discharge: 2017-07-28 | Disposition: A | Discharge status: Home / Self Care | Payer: Self-pay | Source: Ambulatory Visit | Attending: Physician Assistant | Admitting: Physician Assistant

## 2017-07-28 DIAGNOSIS — K429 Umbilical hernia without obstruction or gangrene: Secondary | ICD-10-CM | POA: Insufficient documentation

## 2017-07-28 DIAGNOSIS — M47814 Spondylosis without myelopathy or radiculopathy, thoracic region: Secondary | ICD-10-CM | POA: Insufficient documentation

## 2017-07-28 DIAGNOSIS — E279 Disorder of adrenal gland, unspecified: Secondary | ICD-10-CM | POA: Insufficient documentation

## 2017-07-28 DIAGNOSIS — E278 Other specified disorders of adrenal gland: Secondary | ICD-10-CM

## 2017-07-28 DIAGNOSIS — M47816 Spondylosis without myelopathy or radiculopathy, lumbar region: Secondary | ICD-10-CM | POA: Insufficient documentation

## 2017-07-28 MED ORDER — IOPAMIDOL (ISOVUE-300) INJECTION 61%
INTRAVENOUS | Status: AC
  Administered 2017-07-28: 100 mL via INTRAVENOUS
  Filled 2017-07-28: qty 100

## 2017-07-28 NOTE — Progress Notes (Addendum)
Irving Copas had a procedure done at [facility] today. His appointment time was 4:00 pm. His test was copl;eted at 7:00 pm. He may return to normal activity 07/29/17.  [facility] [place], [place]. (R),(CT) 07/28/2017 18:45

## 2017-07-30 ENCOUNTER — Other Ambulatory Visit (INDEPENDENT_AMBULATORY_CARE_PROVIDER_SITE_OTHER): Payer: Self-pay | Admitting: Physician Assistant

## 2017-07-30 DIAGNOSIS — Z9189 Other specified personal risk factors, not elsewhere classified: Secondary | ICD-10-CM

## 2017-07-30 DIAGNOSIS — N2889 Other specified disorders of kidney and ureter: Secondary | ICD-10-CM

## 2017-07-30 NOTE — Progress Notes (Signed)
Will need to call radiology and obtain order number for "MR Adrenal protocol" as it does not appear as a selection in Epic.

## 2017-08-11 ENCOUNTER — Telehealth (INDEPENDENT_AMBULATORY_CARE_PROVIDER_SITE_OTHER): Payer: Self-pay | Admitting: Physician Assistant

## 2017-08-11 ENCOUNTER — Other Ambulatory Visit (INDEPENDENT_AMBULATORY_CARE_PROVIDER_SITE_OTHER): Payer: Self-pay | Admitting: Physician Assistant

## 2017-08-11 DIAGNOSIS — N2889 Other specified disorders of kidney and ureter: Secondary | ICD-10-CM

## 2017-08-11 NOTE — Telephone Encounter (Signed)
I called and left message asking him to call to make an appointment to discuss findings and further imaging.

## 2017-08-11 NOTE — Telephone Encounter (Signed)
FWD to PCP. Demarcus Thielke S Andrez Lieurance, CMA  

## 2017-08-11 NOTE — Telephone Encounter (Signed)
Patient wants to know his ct results . Please, call him back  Thank You

## 2017-08-12 ENCOUNTER — Telehealth (INDEPENDENT_AMBULATORY_CARE_PROVIDER_SITE_OTHER): Payer: Self-pay

## 2017-08-12 NOTE — Telephone Encounter (Signed)
Patient has an appointment tomorrow 07-13-17 @ 10:30am and he is aware of the appointment

## 2017-08-12 NOTE — Telephone Encounter (Signed)
Noted  

## 2017-08-12 NOTE — Telephone Encounter (Signed)
-----   Message from Clent Demark, PA-C sent at 08/11/2017  5:39 PM EST ----- I have ordered MRI. I still think patient should come in for an explanation. I have already called and left message on his phone to call back and schedule appointment to discuss CT findings and next step in evaluation.

## 2017-08-13 ENCOUNTER — Encounter (INDEPENDENT_AMBULATORY_CARE_PROVIDER_SITE_OTHER): Payer: Self-pay | Admitting: Physician Assistant

## 2017-08-13 ENCOUNTER — Ambulatory Visit (INDEPENDENT_AMBULATORY_CARE_PROVIDER_SITE_OTHER): Payer: Self-pay | Admitting: Physician Assistant

## 2017-08-13 ENCOUNTER — Other Ambulatory Visit: Payer: Self-pay

## 2017-08-13 VITALS — BP 143/91 | HR 77 | Temp 97.8°F | Wt 239.4 lb

## 2017-08-13 DIAGNOSIS — J321 Chronic frontal sinusitis: Secondary | ICD-10-CM

## 2017-08-13 DIAGNOSIS — N2889 Other specified disorders of kidney and ureter: Secondary | ICD-10-CM

## 2017-08-13 MED ORDER — AMOXICILLIN-POT CLAVULANATE 875-125 MG PO TABS: 1.0000 | ORAL_TABLET | Freq: Two times a day (BID) | ORAL | 0 refills | Status: DC

## 2017-08-13 MED ORDER — PHENYLEPHRINE-DM-GG-APAP 5-10-200-325 MG/10ML PO LIQD: 20.0000 mL | ORAL | 0 refills | Status: AC

## 2017-08-13 MED FILL — AMOX-CLAV 875-125 MG TABLET: 875-125 | 10 days supply | Qty: 20 | Fill #0

## 2017-08-13 NOTE — Patient Instructions (Signed)

## 2017-08-13 NOTE — Progress Notes (Signed)
Subjective:  Patient ID: Randy Hansen, male    DOB: 09/10/1961  Age: 56 y.o. MRN: [id number]  CC: results from CT  HPI  Randy Hansen is a 56 y.o. male with a medical history of acute medial meniscal tear presents to discuss with CT abdomen findings. He was found to have a renal mass on previous CT. Would like to know if he has cancer. Does not endorse unintentional weight loss, night sweats, chronic fatigue, fever of unknown origin.    Reports being sick for two weeks. Has right ear "fluttering", tactile fever, malaise, nasal congestion, and frontal head pressure. Attributed to the flu vaccine. He is living in a shelter and has been exposed to sick people. Took Mucinex one night and felt worse in regards to his symptoms. Does not endorse CP, palpitations, SOB, neck/back pain, nuchal rigidity, rash, swelling, abdominal pain, or GI/GU sxs.  Outpatient Medications Prior to Visit  Medication Sig Dispense Refill  . diphenhydrAMINE (BENADRYL) 25 MG tablet Take 1 tablet (25 mg total) by mouth every 6 (six) hours as needed. (Patient not taking: Reported on 06/27/2017) 30 tablet 0  . naproxen (NAPROSYN) 500 MG tablet Take 1 tablet (500 mg total) by mouth 2 (two) times daily as needed for moderate pain. (Patient not taking: Reported on 06/27/2017) 15 tablet 0   No facility-administered medications prior to visit.      ROS Review of Systems  Constitutional: Negative for chills, fever and malaise/fatigue.  HENT: Positive for ear pain and sinus pain.   Eyes: Negative for blurred vision and pain.  Respiratory: Negative for shortness of breath.   Cardiovascular: Negative for chest pain and palpitations.  Gastrointestinal: Negative for abdominal pain and nausea.  Genitourinary: Negative for dysuria and hematuria.  Musculoskeletal: Negative for joint pain and myalgias.  Skin: Negative for rash.  Neurological: Negative for tingling and headaches.  Psychiatric/Behavioral: Negative for depression.  The patient is not nervous/anxious.     Objective:  BP (!) 143/91 (BP Location: Right Arm, Patient Position: Sitting, Cuff Size: Large)   Pulse 77   Temp 97.8 F (36.6 C) (Oral)   Wt 239 lb 6.4 oz (108.6 kg)   SpO2 95%   BMI 33.39 kg/m   BP/Weight 08/13/2017 07/24/2017 08/65/7846  Systolic BP 962 952 841  Diastolic BP 91 93 94  Wt. (Lbs) 239.4 230 -  BMI 33.39 32.08 -      Physical Exam  Constitutional: He is oriented to person, place, and time.  Mildly ill, NAD, polite  HENT:  Head: Normocephalic and atraumatic.  Right TM erythematous with mild bulging. Oropharynx mildly erythematous with no exudates. Turbinates moderately hypertrophic.  Eyes: No scleral icterus.  Neck: Normal range of motion. Neck supple. No thyromegaly present.  Cardiovascular: Normal rate, regular rhythm and normal heart sounds.  Pulmonary/Chest: Effort normal and breath sounds normal. No respiratory distress. He has no wheezes. He has no rales.  Musculoskeletal: He exhibits no edema.  Lymphadenopathy:    He has no cervical adenopathy.  Neurological: He is alert and oriented to person, place, and time. No cranial nerve deficit. Coordination normal.  Skin: Skin is warm and dry. No rash noted. No erythema. No pallor.  Psychiatric: He has a normal mood and affect. His behavior is normal. Thought content normal.  Vitals reviewed.    Assessment & Plan:     1. Renal mass --- I have printed a copy of patient's CT abdomen report and explained findings. All questions answered to the  best of my knowledge. I have already ordered patient's MRI abdomen and he has an appointment this Friday to further differentiate the renal mass as benign or malignant.   2. Chronic frontal sinusitis - Phenylephrine-DM-GG-APAP (MUCINEX FAST-MAX COLD FLU) 5-10-200-325 MG/10ML LIQD; Take 20 mLs by mouth every 4 (four) hours for 5 days.  Dispense: 1 Bottle; Refill: 0 - amoxicillin-clavulanate (AUGMENTIN) 875-125 MG tablet; Take  1 tablet by mouth 2 (two) times daily.  Dispense: 20 tablet; Refill: 0   Meds ordered this encounter  Medications  . Phenylephrine-DM-GG-APAP (MUCINEX FAST-MAX COLD FLU) 5-10-200-325 MG/10ML LIQD    Sig: Take 20 mLs by mouth every 4 (four) hours for 5 days.    Dispense:  1 Bottle    Refill:  0    Order Specific Question:   Supervising Provider    Answer:   Tresa Garter [id number]  . amoxicillin-clavulanate (AUGMENTIN) 875-125 MG tablet    Sig: Take 1 tablet by mouth 2 (two) times daily.    Dispense:  20 tablet    Refill:  0    Order Specific Question:   Supervising Provider    Answer:   Tresa Garter [id number]    Follow-up: Return if symptoms worsen or fail to improve.   Clent Demark PA

## 2017-08-22 ENCOUNTER — Encounter: Payer: Self-pay | Admitting: Pediatric Intensive Care

## 2017-08-22 ENCOUNTER — Ambulatory Visit (HOSPITAL_COMMUNITY)
Admission: RE | Admit: 2017-08-22 | Discharge: 2017-08-22 | Disposition: A | Discharge status: Home / Self Care | Payer: Self-pay | Source: Ambulatory Visit | Attending: Physician Assistant | Admitting: Physician Assistant

## 2017-08-22 ENCOUNTER — Ambulatory Visit (HOSPITAL_COMMUNITY): Payer: No Typology Code available for payment source

## 2017-08-22 DIAGNOSIS — N2889 Other specified disorders of kidney and ureter: Secondary | ICD-10-CM | POA: Insufficient documentation

## 2017-08-22 DIAGNOSIS — E279 Disorder of adrenal gland, unspecified: Secondary | ICD-10-CM | POA: Insufficient documentation

## 2017-08-22 MED ORDER — GADOBENATE DIMEGLUMINE 529 MG/ML IV SOLN
20.0000 mL | Freq: Once | INTRAVENOUS | Status: AC | PRN
  Administered 2017-08-22: 20 mL via INTRAVENOUS

## 2017-08-22 NOTE — Congregational Nurse Program (Signed)
Congregational Nurse Program Note  Date of Encounter: 08/22/2017  Past Medical History: Past Medical History:  Diagnosis Date  . Acute medial meniscal tear     Encounter Details: CNP Questionnaire - 08/22/17 0930      Questionnaire   Patient Status  Not Applicable    Race  Black or African American    Location Patient [place]  Not Applicable    Uninsured  Uninsured (NEW 1x/quarter)    Food  Yes, have food insecurities    Housing/Utilities  No permanent housing    Transportation  Yes, need transportation assistance;Provided transportation assistance (bus pass, taxi voucher, etc.)    Interpersonal Safety  Yes, feel physically and emotionally safe where you currently live    Medical Provider  Yes    Referrals  Other;Primary Care Provider/Clinic    ED Visit Averted  Not Applicable    Life-Saving Intervention Made  Not Applicable     New client- states that he is a patient at [facility], has [employer]. He states that he had an CT which showed a mass on his kidney and is going to [facility] for an MRI today. Client states no personal history of hypertension but has family history. BP elevated today. CN discussed strategies for decreasing salt in food. Client states that he has persistent URI and has been taking antibiotics and Mucinex D for symptom management. Client will return to clinic for BP check.

## 2017-08-25 ENCOUNTER — Telehealth (INDEPENDENT_AMBULATORY_CARE_PROVIDER_SITE_OTHER): Payer: Self-pay

## 2017-08-25 NOTE — Telephone Encounter (Signed)
Patient aware of results and is scheduled for 12/11. Nat Christen, CMA

## 2017-08-25 NOTE — Telephone Encounter (Signed)
-----   Message from Clent Demark, PA-C sent at 08/25/2017  1:48 PM EST ----- Imaging shows his mass is likely not cancerous. However, there is a tumor and it may be causing him problems. I will need to conduct a couple of tests to help with the diagnosis of what they found.

## 2017-08-26 MED ORDER — GADOBENATE DIMEGLUMINE 529 MG/ML IV SOLN
20.0000 mL | Freq: Once | INTRAVENOUS | Status: AC | PRN
  Administered 2017-08-22: 20 mL via INTRAVENOUS

## 2017-09-03 ENCOUNTER — Ambulatory Visit (INDEPENDENT_AMBULATORY_CARE_PROVIDER_SITE_OTHER): Payer: Self-pay | Admitting: Physician Assistant

## 2017-09-09 ENCOUNTER — Ambulatory Visit (INDEPENDENT_AMBULATORY_CARE_PROVIDER_SITE_OTHER): Payer: Self-pay | Admitting: Physician Assistant

## 2017-09-12 ENCOUNTER — Ambulatory Visit (INDEPENDENT_AMBULATORY_CARE_PROVIDER_SITE_OTHER): Payer: Self-pay | Admitting: Physician Assistant

## 2017-09-19 ENCOUNTER — Ambulatory Visit (INDEPENDENT_AMBULATORY_CARE_PROVIDER_SITE_OTHER): Payer: Self-pay | Admitting: Physician Assistant

## 2017-10-01 ENCOUNTER — Encounter (HOSPITAL_COMMUNITY): Payer: Self-pay | Admitting: Emergency Medicine

## 2017-10-01 ENCOUNTER — Emergency Department (HOSPITAL_COMMUNITY): Payer: Self-pay

## 2017-10-01 DIAGNOSIS — R0602 Shortness of breath: Secondary | ICD-10-CM | POA: Insufficient documentation

## 2017-10-01 DIAGNOSIS — R252 Cramp and spasm: Secondary | ICD-10-CM | POA: Insufficient documentation

## 2017-10-01 DIAGNOSIS — F17210 Nicotine dependence, cigarettes, uncomplicated: Secondary | ICD-10-CM | POA: Insufficient documentation

## 2017-10-01 MED ORDER — OXYCODONE-ACETAMINOPHEN 5-325 MG PO TABS
1.0000 | ORAL_TABLET | ORAL | Status: DC | PRN
  Administered 2017-10-01: 1 via ORAL
  Filled 2017-10-01: qty 1

## 2017-10-01 NOTE — ED Triage Notes (Signed)
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

## 2017-10-02 ENCOUNTER — Emergency Department (HOSPITAL_COMMUNITY)
Admission: EM | Admit: 2017-10-02 | Discharge: 2017-10-02 | Disposition: A | Discharge status: Home / Self Care | Payer: Self-pay | Attending: Emergency Medicine | Admitting: Emergency Medicine

## 2017-10-02 DIAGNOSIS — M62830 Muscle spasm of back: Secondary | ICD-10-CM

## 2017-10-02 DIAGNOSIS — R0602 Shortness of breath: Secondary | ICD-10-CM

## 2017-10-02 LAB — BASIC METABOLIC PANEL
ANION GAP: 12 (ref 5–15)
BUN: 13 mg/dL (ref 6–20)
CHLORIDE: 103 mmol/L (ref 101–111)
CO2: 21 mmol/L — AB (ref 22–32)
CREATININE: 1.2 mg/dL (ref 0.61–1.24)
Calcium: 9.6 mg/dL (ref 8.9–10.3)
GFR calc non Af Amer: >60 mL/min (ref >60)
Glucose, Bld: 78 mg/dL (ref 65–99)
POTASSIUM: 3.7 mmol/L (ref 3.5–5.1)
Sodium: 136 mmol/L (ref 135–145)

## 2017-10-02 LAB — CBC
HEMATOCRIT: 46.2 % (ref 39.0–52.0)
HEMOGLOBIN: 16 g/dL (ref 13.0–17.0)
MCH: 31.1 pg (ref 26.0–34.0)
MCHC: 34.6 g/dL (ref 30.0–36.0)
MCV: 89.7 fL (ref 78.0–100.0)
Platelets: 282 10*3/uL (ref 150–400)
RBC: 5.15 MIL/uL (ref 4.22–5.81)
RDW: 12.4 % (ref 11.5–15.5)
WBC: 9.9 10*3/uL (ref 4.0–10.5)

## 2017-10-02 LAB — D-DIMER, QUANTITATIVE (NOT AT ARMC): D DIMER QUANT: 0.28 ug{FEU}/mL (ref 0.00–0.50)

## 2017-10-02 LAB — I-STAT TROPONIN, ED
Troponin i, poc: 0 ng/mL (ref 0.00–0.08)
Troponin i, poc: 0.01 ng/mL (ref 0.00–0.08)

## 2017-10-02 MED ORDER — CYCLOBENZAPRINE HCL 10 MG PO TABS: 10.0000 mg | ORAL_TABLET | Freq: Two times a day (BID) | ORAL | 0 refills | Status: DC | PRN

## 2017-10-02 NOTE — ED Provider Notes (Signed)
[facility] EMERGENCY DEPARTMENT Provider Note   CSN: [id number] Arrival date & time: 10/01/17  2245     History   Chief Complaint Chief Complaint  Patient presents with  . Shortness of Breath  . Back Spasms  . Leg Pain    HPI Randy Hansen is a 57 y.o. male.  Patient presents to the ED with a chief complaint of back spasm and SOB.  He states that the symptoms started 2 days ago while riding his bike.  He states that he was riding his bike with a back pack on and thought that the back pack is what caused his spasms.  He states that he then experienced some SOB.  He states that he has no symptoms now.  He denies ever having had any CP.  There are no modifying factors.   The history is provided by the patient. No language interpreter was used.    Past Medical History:  Diagnosis Date  . Acute medial meniscal tear     There are no active problems to display for this patient.   Past Surgical History:  Procedure Laterality Date  . NO PAST SURGERIES         Home Medications    Prior to Admission medications   Medication Sig Start Date End Date Taking? Authorizing Provider  sertraline (ZOLOFT) 25 MG tablet Take 25 mg by mouth daily.   Yes [provider]  amoxicillin-clavulanate (AUGMENTIN) 875-125 MG tablet Take 1 tablet by mouth 2 (two) times daily. Patient not taking: Reported on 10/02/2017 08/13/17   Clent Demark, PA-C  cyclobenzaprine (FLEXERIL) 10 MG tablet Take 1 tablet (10 mg total) by mouth 2 (two) times daily as needed for muscle spasms. 10/02/17   Montine Circle, PA-C  diphenhydrAMINE (BENADRYL) 25 MG tablet Take 1 tablet (25 mg total) by mouth every 6 (six) hours as needed. Patient not taking: Reported on 06/27/2017 02/11/17   Clent Demark, PA-C  naproxen (NAPROSYN) 500 MG tablet Take 1 tablet (500 mg total) by mouth 2 (two) times daily as needed for moderate pain. Patient not taking: Reported on 06/27/2017 06/20/17    Ward, Ozella Almond, PA-C    Family History History reviewed. No pertinent family history.  Social History Social History   Tobacco Use  . Smoking status: Current Some Day Smoker    Packs/day: 0.50    Types: Cigarettes  . Smokeless tobacco: Current User  Substance Use Topics  . Alcohol use: Yes    Alcohol/week: 7.2 oz    Types: 12 Cans of beer per week    Comment: social  . Drug use: No     Allergies   Patient has no known allergies.   Review of Systems Review of Systems  All other systems reviewed and are negative.    Physical Exam Updated Vital Signs BP 103/84   Pulse 88   Temp 98.3 F (36.8 C) (Oral)   Resp 20   Ht 5\' 11"  (1.803 m)   Wt 104.3 kg (230 lb)   SpO2 94%   BMI 32.08 kg/m   Physical Exam  Constitutional: He is oriented to person, place, and time. He appears well-developed and well-nourished.  HENT:  Head: Normocephalic and atraumatic.  Eyes: Conjunctivae and EOM are normal. Pupils are equal, round, and reactive to light. Right eye exhibits no discharge. Left eye exhibits no discharge. No scleral icterus.  Neck: Normal range of motion. Neck supple. No JVD present.  Cardiovascular: Normal rate,  regular rhythm and normal heart sounds. Exam reveals no gallop and no friction rub.  No murmur heard. Pulmonary/Chest: Effort normal and breath sounds normal. No respiratory distress. He has no wheezes. He has no rales. He exhibits no tenderness.  Abdominal: Soft. He exhibits no distension and no mass. There is no tenderness. There is no rebound and no guarding.  Musculoskeletal: Normal range of motion. He exhibits no edema or tenderness.  Neurological: He is alert and oriented to person, place, and time.  Skin: Skin is warm and dry.  Psychiatric: He has a normal mood and affect. His behavior is normal. Judgment and thought content normal.  Nursing note and vitals reviewed.    ED Treatments / Results  Labs (all labs ordered are listed, but only  abnormal results are displayed) Labs Reviewed  BASIC METABOLIC PANEL - Abnormal; Notable for the following components:      Result Value   CO2 21 (*)    All other components within normal limits  CBC  D-DIMER, QUANTITATIVE (NOT AT [facility])  I-STAT TROPONIN, ED  I-STAT TROPONIN, ED    EKG  EKG Interpretation  Date/Time:  Wednesday October 01 2017 23:43:02 EST Ventricular Rate:  105 PR Interval:  154 QRS Duration: 94 QT Interval:  344 QTC Calculation: 454 R Axis:   -64 Text Interpretation:  Sinus tachycardia Confirmed by Dory Horn) on 10/02/2017 3:44:20 AM       Radiology Dg Chest 2 View  Result Date: 10/02/2017 CLINICAL DATA:  57 year old male with shortness of breath and chest pain. EXAM: CHEST  2 VIEW COMPARISON:  Chest radiograph dated 06/20/2017 FINDINGS: The heart size and mediastinal contours are within normal limits. Both lungs are clear. The visualized skeletal structures are unremarkable. IMPRESSION: No active cardiopulmonary disease. Electronically Signed   By: Anner Crete M.D.   On: 10/02/2017 00:19    Procedures Procedures (including critical care time)  Medications Ordered in ED Medications  oxyCODONE-acetaminophen (PERCOCET/ROXICET) 5-325 MG per tablet 1 tablet (1 tablet Oral Given 10/01/17 2341)     Initial Impression / Assessment and Plan / ED Course  I have reviewed the triage vital signs and the nursing notes.  Pertinent labs & imaging results that were available during my care of the patient were reviewed by me and considered in my medical decision making (see chart for details).     Patient presents with SOB and back spasm.  DDx includes ACS, PE, pneumothorax, aortic dissection, esophageal rupture, pericarditis, chest wall pain.  Doubt ACS, normal troponin, no ischemic EKG findings, HEART score is: 2.  Low risk for PE, d-dimer negative, patient is not tachycardic nor hypoxic.  No evidence of pneumothorax on CXR.  Doubt dissection, no  mediastinal widening on CXR, no ripping/tearing chest pain, neurovascularly intact.  Doubt pericarditis, no positional changes, or diffuse ST elevations on EKG.  Pain is not reproducible, doubt MSK.  Delta troponin is negative.  Patient is currently pain free.  Recommend close follow-up with PCP.  Will treat with muscle relaxer.  Patient discussed with Dr. Randal Buba.  All findings were discussed with patient.  Patient understands and agrees with the plan.     Final Clinical Impressions(s) / ED Diagnoses   Final diagnoses:  Shortness of breath  Back spasm    ED Discharge Orders        Ordered    cyclobenzaprine (FLEXERIL) 10 MG tablet  2 times daily PRN     10/02/17 0612       Marlon Pel,  Herbie Baltimore, PA-C 10/02/17 9688    Randal Buba, April, MD 10/02/17 [id number]

## 2017-10-02 NOTE — ED Notes (Signed)
Spoke w/ Maudie Mercury in main lab who stated she would be able to add on D-dimmer to previously collected blood work.

## 2017-10-31 ENCOUNTER — Ambulatory Visit: Payer: Self-pay

## 2017-11-21 ENCOUNTER — Ambulatory Visit: Payer: Self-pay

## 2017-12-16 ENCOUNTER — Ambulatory Visit: Payer: Self-pay

## 2018-01-13 ENCOUNTER — Ambulatory Visit: Payer: Self-pay

## 2018-01-21 ENCOUNTER — Other Ambulatory Visit: Payer: Self-pay

## 2018-01-21 ENCOUNTER — Emergency Department (HOSPITAL_COMMUNITY)
Admission: EM | Admit: 2018-01-21 | Discharge: 2018-01-21 | Disposition: A | Discharge status: Home / Self Care | Payer: Self-pay | Attending: Emergency Medicine | Admitting: Emergency Medicine

## 2018-01-21 DIAGNOSIS — M5432 Sciatica, left side: Secondary | ICD-10-CM | POA: Insufficient documentation

## 2018-01-21 DIAGNOSIS — F17210 Nicotine dependence, cigarettes, uncomplicated: Secondary | ICD-10-CM | POA: Insufficient documentation

## 2018-01-21 DIAGNOSIS — Z79899 Other long term (current) drug therapy: Secondary | ICD-10-CM | POA: Insufficient documentation

## 2018-01-21 MED ORDER — METHOCARBAMOL 500 MG PO TABS: 500.0000 mg | ORAL_TABLET | Freq: Two times a day (BID) | ORAL | 0 refills | Status: DC

## 2018-01-21 MED ORDER — IBUPROFEN 600 MG PO TABS: 600.0000 mg | ORAL_TABLET | Freq: Four times a day (QID) | ORAL | 0 refills | Status: DC | PRN

## 2018-01-21 MED ORDER — IBUPROFEN 400 MG PO TABS
600.0000 mg | ORAL_TABLET | Freq: Once | ORAL | Status: AC
  Administered 2018-01-21: 21:00:00 600 mg via ORAL
  Filled 2018-01-21: qty 1

## 2018-01-21 NOTE — ED Provider Notes (Signed)
[facility] EMERGENCY DEPARTMENT Provider Note   CSN: [id number] Arrival date & time: 01/21/18  1951     History   Chief Complaint Chief Complaint  Patient presents with  . Hip Pain    HPI Randy Hansen is a 57 y.o. male.  Randy Hansen is a 57 y.o. Male who is otherwise healthy, presents to the ED for evaluation of pain that starts over the left buttock and radiates down the back of his left leg.  Patient reports he has been experiencing this pain intermittently over the past 2 to 3 weeks.  Patient reports she is tried ibuprofen a few times with some relief, has also tried some heat and stretching.  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  Patient denies any midline back pain.  Patient is continued to be ambulatory without difficulty.  He denies any fevers or chills, urinary symptoms, abdominal pain, nausea or vomiting, no numbness or weakness in bilateral legs no loss of bowel or bladder control or saddle anesthesia.  Patient denies any history of cancer or IV drug use.     Past Medical History:  Diagnosis Date  . Acute medial meniscal tear     There are no active problems to display for this patient.   Past Surgical History:  Procedure Laterality Date  . NO PAST SURGERIES          Home Medications    Prior to Admission medications   Medication Sig Start Date End Date Taking? Authorizing Provider  amoxicillin-clavulanate (AUGMENTIN) 875-125 MG tablet Take 1 tablet by mouth 2 (two) times daily. Patient not taking: Reported on 10/02/2017 08/13/17   Clent Demark, PA-C  cyclobenzaprine (FLEXERIL) 10 MG tablet Take 1 tablet (10 mg total) by mouth 2 (two) times daily as needed for muscle spasms. 10/02/17   Montine Circle, PA-C  diphenhydrAMINE (BENADRYL) 25 MG tablet Take 1 tablet (25 mg total) by mouth every  6 (six) hours as needed. Patient not taking: Reported on 06/27/2017 02/11/17   Clent Demark, PA-C  naproxen (NAPROSYN) 500 MG tablet Take 1 tablet (500 mg total) by mouth 2 (two) times daily as needed for moderate pain. Patient not taking: Reported on 06/27/2017 06/20/17   Ward, Ozella Almond, PA-C  sertraline (ZOLOFT) 25 MG tablet Take 25 mg by mouth daily.    [provider]    Family History No family history on file.  Social History Social History   Tobacco Use  . Smoking status: Current Some Day Smoker    Packs/day: 0.50    Types: Cigarettes  . Smokeless tobacco: Current User  Substance Use Topics  . Alcohol use: Yes    Alcohol/week: 7.2 oz    Types: 12 Cans of beer per week    Comment: social  . Drug use: No     Allergies   Patient has no known allergies.   Review of Systems Review of Systems  Constitutional: Negative for chills and fever.  Eyes: Negative for visual disturbance.  Respiratory: Negative for shortness of breath.   Cardiovascular: Negative for chest pain.  Gastrointestinal: Negative for abdominal pain, nausea and vomiting.  Genitourinary: Negative for difficulty urinating, dysuria and frequency.  Musculoskeletal: Positive for back pain and myalgias.  Skin: Negative for color change and rash.  Neurological: Negative for weakness and  numbness.     Physical Exam Updated Vital Signs BP (!) 143/87 (BP Location: Right Arm)   Pulse 87   Temp 98.6 F (37 C) (Oral)   Resp 20   Ht 5\' 11"  (1.803 m)   Wt 97.5 kg (215 lb)   SpO2 99%   BMI 29.99 kg/m   Physical Exam  Constitutional: He appears well-developed and well-nourished. No distress.  HENT:  Head: Normocephalic and atraumatic.  Eyes: Right eye exhibits no discharge. Left eye exhibits no discharge.  Cardiovascular: Normal rate, regular rhythm, normal heart sounds and intact distal pulses.  Pulmonary/Chest: Effort normal and breath sounds normal. No respiratory distress.    Abdominal: Soft. Bowel sounds are normal. He exhibits no distension and no mass. There is no tenderness. There is no guarding.  Musculoskeletal:  No midline lumbar spinal tenderness, there is some tenderness over the left buttock, no erythema or skin changes, no palpable deformity, lipoma over the left lateral hip, no concern for overlying infection, normal flexion and extension at the knee and ankle, no lower extremity swelling, positive straight leg raise on the left  Neurological: He is alert. Coordination normal.  Patient alert and oriented, speech is clear and he is able to follow commands. Bilateral lower extremities with normal strength of proximal and distal muscles, sensation intact, 2+ patellar DTRs bilaterally Patient is ambulatory without difficulty  Skin: Skin is warm and dry. Capillary refill takes less than 2 seconds. He is not diaphoretic.  Psychiatric: He has a normal mood and affect. His behavior is normal.  Nursing note and vitals reviewed.    ED Treatments / Results  Labs (all labs ordered are listed, but only abnormal results are displayed) Labs Reviewed - No data to display  EKG None  Radiology No results found.  Procedures Procedures (including critical care time)  Medications Ordered in ED Medications  ibuprofen (ADVIL,MOTRIN) tablet 600 mg (has no administration in time range)     Initial Impression / Assessment and Plan / ED Course  I have reviewed the triage vital signs and the nursing notes.  Pertinent labs & imaging results that were available during my care of the patient were reviewed by me and considered in my medical decision making (see chart for details).  Normal neurological exam, no evidence of urinary incontinence or retention, pain is consistently reproducible.  Patient can walk with some discomfort.  No loss of bowel or bladder control.  No concern for cauda equina.  No fever, night sweats, weight loss, h/o cancer, IVDU.  Pain treated  here in the department with adequate improvement. RICE protocol and pain medicine indicated and discussed with patient. I have also discussed reasons to return immediately to the ER.  Patient expresses understanding and agrees with plan.    Final Clinical Impressions(s) / ED Diagnoses   Final diagnoses:  Sciatica of left side    ED Discharge Orders        Ordered    ibuprofen (ADVIL,MOTRIN) 600 MG tablet  Every 6 hours PRN     01/21/18 2111    methocarbamol (ROBAXIN) 500 MG tablet  2 times daily     01/21/18 2111       Janet Berlin 01/21/18 2115    Fatima Blank, MD 01/23/18 1016

## 2018-01-21 NOTE — ED Notes (Signed)
Discharge instructions and prescription discussed with Pt. Pt verbalized understanding. Pt stable and ambulatory.    

## 2018-01-21 NOTE — ED Triage Notes (Signed)
Patient c/o left hip pain that radiates down left leg that has been going on for the last 2-3 weeks. Also c/o intermittent HA's.

## 2018-01-21 NOTE — Discharge Instructions (Signed)
Use ibuprofen daily as discussed, you may use Robaxin at night as we discussed this can cause drowsiness do not take before driving going to work and do not take with alcohol.  Also encourage you to use ice and heat, stretches provided and muscle creams.  Please follow-up with your primary care doctor.  If you develop severe pain, or unable to walk have numbness or weakness of your leg, lose control of your bowels or bladder or have other new or concerning symptoms you may return to the emergency department for reevaluation.

## 2018-02-19 ENCOUNTER — Encounter (HOSPITAL_COMMUNITY): Payer: Self-pay | Admitting: Emergency Medicine

## 2018-02-19 ENCOUNTER — Emergency Department (HOSPITAL_COMMUNITY)
Admission: EM | Admit: 2018-02-19 | Discharge: 2018-02-20 | Disposition: A | Discharge status: Home / Self Care | Payer: Self-pay | Attending: Emergency Medicine | Admitting: Emergency Medicine

## 2018-02-19 DIAGNOSIS — R400 Somnolence: Secondary | ICD-10-CM | POA: Insufficient documentation

## 2018-02-19 DIAGNOSIS — T43211A Poisoning by selective serotonin and norepinephrine reuptake inhibitors, accidental (unintentional), initial encounter: Secondary | ICD-10-CM | POA: Insufficient documentation

## 2018-02-19 DIAGNOSIS — T50905A Adverse effect of unspecified drugs, medicaments and biological substances, initial encounter: Secondary | ICD-10-CM

## 2018-02-19 DIAGNOSIS — F17210 Nicotine dependence, cigarettes, uncomplicated: Secondary | ICD-10-CM | POA: Insufficient documentation

## 2018-02-19 NOTE — ED Triage Notes (Addendum)
Pt reports he started taking two psy medication yesterday (abilify and prozac).  He stated he was "knocked out" and very dizzy w/ double vision.  Pt reports a rash on his back.  He wants to be checked out.

## 2018-02-20 NOTE — Discharge Instructions (Signed)
You should continue taking the Prozac, do not take Abilify until you talk to your psychiatrist about further instructions on how to dose her medications.

## 2018-02-20 NOTE — ED Provider Notes (Signed)
[facility] EMERGENCY DEPARTMENT Provider Note   CSN: [id number] Arrival date & time: 02/19/18  2308     History   Chief Complaint Chief Complaint  Patient presents with  . Medication Reaction    HPI Randy Hansen is a 57 y.o. male.  Patient presents to the ER with concerns over possible medication reaction.  Patient reports that he just had his psychiatric medications changed.  He was placed on Abilify and Prozac.  He took his first dose 2 days ago.  He reports that he slept the entire day, missing work.  The next day he woke up and felt groggy, fell back asleep and missed work again.  At this time he is starting to wake up and feel improved, but is not sure what to do with his medications.  He also noticed a rash on his buttocks that he would like checked out.     Past Medical History:  Diagnosis Date  . Acute medial meniscal tear     There are no active problems to display for this patient.   Past Surgical History:  Procedure Laterality Date  . NO PAST SURGERIES          Home Medications    Prior to Admission medications   Not on File    Family History No family history on file.  Social History Social History   Tobacco Use  . Smoking status: Current Some Day Smoker    Packs/day: 0.50    Types: Cigarettes  . Smokeless tobacco: Current User  Substance Use Topics  . Alcohol use: Yes    Alcohol/week: 7.2 oz    Types: 12 Cans of beer per week    Comment: social  . Drug use: No     Allergies   Patient has no known allergies.   Review of Systems Review of Systems  Constitutional: Positive for fatigue.  Skin: Positive for rash.  All other systems reviewed and are negative.    Physical Exam Updated Vital Signs BP 126/86 (BP Location: Right Arm)   Pulse 88   Temp 98.2 F (36.8 C) (Oral)   Resp 18   SpO2 99%   Physical Exam  Constitutional: He is oriented to person, place, and time. He appears well-developed and  well-nourished. No distress.  HENT:  Head: Normocephalic and atraumatic.  Right Ear: Hearing normal.  Left Ear: Hearing normal.  Nose: Nose normal.  Mouth/Throat: Oropharynx is clear and moist and mucous membranes are normal.  Eyes: Pupils are equal, round, and reactive to light. Conjunctivae and EOM are normal.  Neck: Normal range of motion. Neck supple.  Cardiovascular: Regular rhythm, S1 normal and S2 normal. Exam reveals no gallop and no friction rub.  No murmur heard. Pulmonary/Chest: Effort normal and breath sounds normal. No respiratory distress. He exhibits no tenderness.  Abdominal: Soft. Normal appearance and bowel sounds are normal. There is no hepatosplenomegaly. There is no tenderness. There is no rebound, no guarding, no tenderness at McBurney's point and negative Murphy's sign. No hernia.  Musculoskeletal: Normal range of motion.  Neurological: He is alert and oriented to person, place, and time. He has normal strength. No cranial nerve deficit or sensory deficit. Coordination normal. GCS eye subscore is 4. GCS verbal subscore is 5. GCS motor subscore is 6.  Skin: Skin is warm, dry and intact. No rash noted. No cyanosis.  Psychiatric: He has a normal mood and affect. His speech is normal and behavior is normal. Thought content normal.  Nursing note and vitals reviewed.    ED Treatments / Results  Labs (all labs ordered are listed, but only abnormal results are displayed) Labs Reviewed - No data to display  EKG None  Radiology No results found.  Procedures Procedures (including critical care time)  Medications Ordered in ED Medications - No data to display   Initial Impression / Assessment and Plan / ED Course  I have reviewed the triage vital signs and the nursing notes.  Pertinent labs & imaging results that were available during my care of the patient were reviewed by me and considered in my medical decision making (see chart for details).     Patient  presents to the ER because he was extremely sedated after starting Prozac and Abilify 2 days ago.  He feels much improved now and is neurologic and remainder of exam was normal.  He reports a rash between his buttocks that sounds like it was some type of chafing or he rash, not present currently.  No further intervention necessary for the rash.  I will recommend that the patient hold his Abilify, continue the Prozac as prescribed.  Contact his psychiatrist at [facility] for further instructions.  Final Clinical Impressions(s) / ED Diagnoses   Final diagnoses:  Adverse effect of drug, initial encounter    ED Discharge Orders    None       Sherrel Shafer, Gwenyth Allegra, MD 02/20/18 [phone]

## 2018-04-10 ENCOUNTER — Ambulatory Visit: Payer: Self-pay

## 2018-04-27 ENCOUNTER — Ambulatory Visit: Payer: Self-pay | Attending: Physician Assistant

## 2018-05-27 ENCOUNTER — Telehealth: Payer: Self-pay | Admitting: Physician Assistant

## 2018-05-27 NOTE — Telephone Encounter (Signed)
I called Pt to info that he need a new appt with financial dept. Since  We sent a letter on 05/05/18 inform him that need a new appt and need to bring all new document to apply for the [employer] discount....all the old document has been destroy for his protection.Randy KitchenMarland KitchenMarland Hansen

## 2018-06-02 ENCOUNTER — Encounter: Payer: Self-pay | Admitting: Physician Assistant

## 2018-06-10 ENCOUNTER — Ambulatory Visit: Payer: Self-pay | Attending: Family Medicine

## 2018-06-19 ENCOUNTER — Telehealth: Payer: Self-pay | Admitting: Physician Assistant

## 2018-06-19 NOTE — Telephone Encounter (Signed)
I called Pt to call back at [facility] financial dept. Is about his app

## 2018-07-04 ENCOUNTER — Other Ambulatory Visit: Payer: Self-pay

## 2018-07-04 ENCOUNTER — Encounter (HOSPITAL_COMMUNITY): Payer: Self-pay | Admitting: Emergency Medicine

## 2018-07-04 ENCOUNTER — Emergency Department (HOSPITAL_COMMUNITY)
Admission: EM | Admit: 2018-07-04 | Discharge: 2018-07-04 | Disposition: A | Discharge status: Home / Self Care | Payer: Self-pay | Attending: Emergency Medicine | Admitting: Emergency Medicine

## 2018-07-04 DIAGNOSIS — M25561 Pain in right knee: Secondary | ICD-10-CM | POA: Insufficient documentation

## 2018-07-04 DIAGNOSIS — F17210 Nicotine dependence, cigarettes, uncomplicated: Secondary | ICD-10-CM | POA: Insufficient documentation

## 2018-07-04 DIAGNOSIS — B356 Tinea cruris: Secondary | ICD-10-CM | POA: Insufficient documentation

## 2018-07-04 MED ORDER — DICLOFENAC SODIUM 1 % TD GEL: 2.0000 g | Freq: Four times a day (QID) | TRANSDERMAL | 0 refills | Status: DC

## 2018-07-04 MED ORDER — CLOTRIMAZOLE 1 % EX CREA: TOPICAL_CREAM | CUTANEOUS | 0 refills | Status: DC

## 2018-07-04 MED ORDER — PREDNISONE 20 MG PO TABS
40.0000 mg | ORAL_TABLET | Freq: Once | ORAL | Status: AC
  Administered 2018-07-04: 40 mg via ORAL
  Filled 2018-07-04: qty 2

## 2018-07-04 MED ORDER — IBUPROFEN 400 MG PO TABS
600.0000 mg | ORAL_TABLET | Freq: Once | ORAL | Status: AC
  Administered 2018-07-04: 16:00:00 600 mg via ORAL
  Filled 2018-07-04: qty 1

## 2018-07-04 MED ORDER — PREDNISONE 50 MG PO TABS: 50.0000 mg | ORAL_TABLET | Freq: Every day | ORAL | 0 refills | Status: AC

## 2018-07-04 NOTE — ED Notes (Signed)
THE pt immediately went to the cafeteria.... Unable to triage.

## 2018-07-04 NOTE — ED Notes (Signed)
Unable to find pt in waiting room. 

## 2018-07-04 NOTE — ED Provider Notes (Signed)
[facility] EMERGENCY DEPARTMENT Provider Note   CSN: [id number] Arrival date & time: 07/04/18  1418     History   Chief Complaint Chief Complaint  Patient presents with  . Knee Pain  . Testicle Pain    HPI Randy Hansen is a 57 y.o. male presenting for evaluation of right knee pain and groin rash.  Patient states he initially injured his knee 3 years ago.  It had completely resolved until about a week ago he started to have increasing pain.  Over the past week, and he has become more painful and swollen.  He reports increased pain with walking and palpation.  He took a Financial risk analyst and another friend's prednisone yesterday, both which improved his symptoms.  He denies new fall, trauma, or injury.  He denies numbness or tingling.  He denies fevers, chills, hip pain, or ankle pain.  He does not have an orthopedic doctor here in [place]. Additionally, patient states he is having a rash around his groin which is consistent with jock itch.  He is requesting a cream for his jock itch.  He denies urinary symptoms or rash elsewhere.  HPI  Past Medical History:  Diagnosis Date  . Acute medial meniscal tear     There are no active problems to display for this patient.   Past Surgical History:  Procedure Laterality Date  . NO PAST SURGERIES          Home Medications    Prior to Admission medications   Medication Sig Start Date End Date Taking? Authorizing Provider  clotrimazole (LOTRIMIN) 1 % cream Apply to affected area 2 times daily for 2 weeks 07/04/18   Olen Eaves, PA-C  diclofenac sodium (VOLTAREN) 1 % GEL Apply 2 g topically 4 (four) times daily. 07/04/18   Cylan Borum, PA-C  predniSONE (DELTASONE) 50 MG tablet Take 1 tablet (50 mg total) by mouth daily for 5 days. 07/04/18 07/09/18  Erendida Wrenn, PA-C    Family History No family history on file.  Social History Social History   Tobacco Use  . Smoking status: Current  Some Day Smoker    Packs/day: 0.50    Types: Cigarettes  . Smokeless tobacco: Current User  Substance Use Topics  . Alcohol use: Yes    Alcohol/week: 12.0 standard drinks    Types: 12 Cans of beer per week    Comment: social  . Drug use: No     Allergies   Patient has no known allergies.   Review of Systems Review of Systems  Constitutional: Negative for fever.  Musculoskeletal: Positive for arthralgias and joint swelling.  Skin: Positive for rash.     Physical Exam Updated Vital Signs BP (!) 152/107 (BP Location: Right Arm)   Pulse 76   Temp 98.8 F (37.1 C) (Oral)   Resp 17   Ht 5\' 11"  (1.803 m)   Wt 99.8 kg   SpO2 99%   BMI 30.68 kg/m   Physical Exam  Constitutional: He is oriented to person, place, and time. He appears well-developed and well-nourished. No distress.  Appears in no distress  HENT:  Head: Normocephalic and atraumatic.  Eyes: EOM are normal.  Neck: Normal range of motion.  Pulmonary/Chest: Effort normal.  Abdominal: He exhibits no distension.  Musculoskeletal: Normal range of motion.  Swelling of the right knee noted.  No erythema or warmth.  Patient is ambulatory.  Full active range of motion of the knee.  Pedal pulses intact bilaterally.  No tenderness of the calf.  Neurological: He is alert and oriented to person, place, and time.  Skin: Skin is warm. No rash noted.  Psychiatric: He has a normal mood and affect.  Nursing note and vitals reviewed.    ED Treatments / Results  Labs (all labs ordered are listed, but only abnormal results are displayed) Labs Reviewed - No data to display  EKG None  Radiology No results found.  Procedures Procedures (including critical care time)  Medications Ordered in ED Medications  ibuprofen (ADVIL,MOTRIN) tablet 600 mg (has no administration in time range)  predniSONE (DELTASONE) tablet 40 mg (has no administration in time range)     Initial Impression / Assessment and Plan / ED Course    I have reviewed the triage vital signs and the nursing notes.  Pertinent labs & imaging results that were available during my care of the patient were reviewed by me and considered in my medical decision making (see chart for details).     Patient presenting for evaluation of right knee pain.  Physical exam reassuring, he is afebrile not tachycardic.  Appears nontoxic.  No erythema or warmth of the knee.  Mild swelling noted.  Doubt septic joint, as patient is able to ambulate and range knee without difficulty.  No new fall, trauma, or injury, doubt acute fracture or dislocation.  I do not believe x-ray would be beneficial at this time.  Consider arthritis versus flare of previous ligamentous injury.  Discussed with patient.  Patient is agreeable to plan.  States he has an appointment with his primary care next week for follow-up.  Will give knee sleeve and prednisone.  Discussed with patient that his symptoms and history do not warrant narcotics at this time. Only, patient reporting groin rash consistent with jock itch, requesting Lotrimin.  Discussed that this is over-the-counter, patient requesting it as a prescription.  Prescription provided. At this time, patient proceed for discharge.  Return precautions given.  Patient states he understands and agrees to plan.   Final Clinical Impressions(s) / ED Diagnoses   Final diagnoses:  Acute pain of right knee  Tinea cruris    ED Discharge Orders         Ordered    predniSONE (DELTASONE) 50 MG tablet  Daily     07/04/18 1601    diclofenac sodium (VOLTAREN) 1 % GEL  4 times daily     07/04/18 1601    clotrimazole (LOTRIMIN) 1 % cream     07/04/18 1601           Brittin Janik, PA-C 07/04/18 1625    Hayden Rasmussen, MD 07/05/18 1018
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
## 2018-07-04 NOTE — Discharge Instructions (Addendum)
Take prednisone as prescribed. Use Voltaren gel up to 4 times a day as needed for pain. Use the knee sleeve to help with pain and support. Use Lotrimin cream twice a day for the next 2 weeks as needed for jock itch. Follow-up with your primary care doctor as needed for further evaluation. Return to the emergency room with any new, worsening, or concerning symptoms.

## 2018-07-06 ENCOUNTER — Encounter

## 2018-07-07 ENCOUNTER — Ambulatory Visit (INDEPENDENT_AMBULATORY_CARE_PROVIDER_SITE_OTHER): Payer: Self-pay | Admitting: Physician Assistant

## 2018-07-07 ENCOUNTER — Ambulatory Visit: Payer: Self-pay | Admitting: Family Medicine

## 2018-07-17 ENCOUNTER — Other Ambulatory Visit: Payer: Self-pay

## 2018-07-17 ENCOUNTER — Emergency Department (HOSPITAL_COMMUNITY)
Admission: EM | Admit: 2018-07-17 | Discharge: 2018-07-17 | Disposition: A | Discharge status: Home / Self Care | Payer: Self-pay | Attending: Emergency Medicine | Admitting: Emergency Medicine

## 2018-07-17 ENCOUNTER — Encounter (HOSPITAL_COMMUNITY): Payer: Self-pay | Admitting: Emergency Medicine

## 2018-07-17 DIAGNOSIS — F17210 Nicotine dependence, cigarettes, uncomplicated: Secondary | ICD-10-CM | POA: Insufficient documentation

## 2018-07-17 DIAGNOSIS — M25561 Pain in right knee: Secondary | ICD-10-CM | POA: Insufficient documentation

## 2018-07-17 DIAGNOSIS — R2241 Localized swelling, mass and lump, right lower limb: Secondary | ICD-10-CM | POA: Insufficient documentation

## 2018-07-17 DIAGNOSIS — F17228 Nicotine dependence, chewing tobacco, with other nicotine-induced disorders: Secondary | ICD-10-CM | POA: Insufficient documentation

## 2018-07-17 MED ORDER — PREDNISONE 20 MG PO TABS
60.0000 mg | ORAL_TABLET | Freq: Once | ORAL | Status: AC
  Administered 2018-07-17: 60 mg via ORAL
  Filled 2018-07-17: qty 3

## 2018-07-17 MED ORDER — CLOTRIMAZOLE 1 % EX CREA: TOPICAL_CREAM | CUTANEOUS | 0 refills | Status: DC

## 2018-07-17 MED ORDER — CYCLOBENZAPRINE HCL 10 MG PO TABS: 10.0000 mg | ORAL_TABLET | Freq: Two times a day (BID) | ORAL | 0 refills | Status: DC | PRN

## 2018-07-17 MED ORDER — PREDNISONE 20 MG PO TABS: 40.0000 mg | ORAL_TABLET | Freq: Every day | ORAL | 0 refills | Status: DC

## 2018-07-17 MED ORDER — DICLOFENAC SODIUM 1 % TD GEL: 4.0000 g | Freq: Four times a day (QID) | TRANSDERMAL | 0 refills | Status: DC

## 2018-07-17 MED ORDER — TRAMADOL HCL 50 MG PO TABS
50.0000 mg | ORAL_TABLET | Freq: Once | ORAL | Status: AC
  Administered 2018-07-17: 50 mg via ORAL
  Filled 2018-07-17: qty 1

## 2018-07-17 NOTE — ED Triage Notes (Signed)
Pt presents to ED for continuing right knee pain, states he feels fluid may need to be drained off.  Patient states he was unable to get up and go to work today because [employer] t.  Asking for work note in triage.  States he was not able to fill any of the prescriptions he was told to take on his last visit for the same.

## 2018-07-17 NOTE — ED Notes (Signed)
Pt stable and ambulatory for discharge, states understanding follow up.  
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
## 2018-07-17 NOTE — ED Notes (Signed)
Patient transported to X-ray 

## 2018-07-17 NOTE — Discharge Instructions (Addendum)
Please follow up with your doctor Continue to wear knee sleeve and ACE wrap Elevate the knee as much as possible to reduce swelling Take medicines as directed

## 2018-07-30 ENCOUNTER — Ambulatory Visit (INDEPENDENT_AMBULATORY_CARE_PROVIDER_SITE_OTHER): Payer: Self-pay | Admitting: Physician Assistant

## 2018-08-10 ENCOUNTER — Emergency Department (HOSPITAL_COMMUNITY)
Admission: EM | Admit: 2018-08-10 | Discharge: 2018-08-10 | Disposition: A | Discharge status: Home / Self Care | Payer: Self-pay | Attending: Emergency Medicine | Admitting: Emergency Medicine

## 2018-08-10 ENCOUNTER — Other Ambulatory Visit: Payer: Self-pay

## 2018-08-10 ENCOUNTER — Encounter (HOSPITAL_COMMUNITY): Payer: Self-pay

## 2018-08-10 DIAGNOSIS — F17210 Nicotine dependence, cigarettes, uncomplicated: Secondary | ICD-10-CM | POA: Insufficient documentation

## 2018-08-10 DIAGNOSIS — Z760 Encounter for issue of repeat prescription: Secondary | ICD-10-CM | POA: Insufficient documentation

## 2018-08-10 MED ORDER — NAPROXEN 375 MG PO TABS: 375.0000 mg | ORAL_TABLET | Freq: Two times a day (BID) | ORAL | 0 refills | Status: DC

## 2018-08-10 MED ORDER — HYDROCODONE-ACETAMINOPHEN 5-325 MG PO TABS
1.0000 | ORAL_TABLET | Freq: Once | ORAL | Status: DC
  Filled 2018-08-10: qty 1

## 2018-08-10 MED ORDER — PREDNISONE 10 MG PO TABS: 40.0000 mg | ORAL_TABLET | Freq: Every day | ORAL | 0 refills | Status: DC

## 2018-08-10 MED ORDER — CLOTRIMAZOLE 1 % EX CREA: TOPICAL_CREAM | CUTANEOUS | 0 refills | Status: DC

## 2018-08-10 MED ORDER — CYCLOBENZAPRINE HCL 10 MG PO TABS: 10.0000 mg | ORAL_TABLET | Freq: Two times a day (BID) | ORAL | 0 refills | Status: DC | PRN

## 2018-08-10 MED FILL — NAPROXEN 375 MG TABLET: 375 | 10 days supply | Qty: 20 | Fill #0

## 2018-08-10 MED FILL — CYCLOBENZAPRINE 10 MG TAB: 10 | 10 days supply | Qty: 20 | Fill #0

## 2018-08-10 MED FILL — predniSONE 10 MG TABS: 10 | 4 days supply | Qty: 16 | Fill #0

## 2018-08-10 NOTE — ED Notes (Signed)
Pt bp noted at 157/110. Pt requesting pain medication.  Reported to [facility] .

## 2018-08-10 NOTE — ED Provider Notes (Signed)
[facility] EMERGENCY DEPARTMENT Provider Note   CSN: [id number] Arrival date & time: 08/10/18  1112     History   Chief Complaint Chief Complaint  Patient presents with  . Medication Refill    HPI Randy Hansen is a 57 y.o. male who presents to the ED for medication refill. Patient reports that he was here a few days ago and had Rx for jock itch and knee pain. He reports he lost his Rx and continues to have the same problems. Patient states that he also just had a tooth pulled today and is in a lot of pain.   HPI  Past Medical History:  Diagnosis Date  . Acute medial meniscal tear     There are no active problems to display for this patient.   Past Surgical History:  Procedure Laterality Date  . NO PAST SURGERIES          Home Medications    Prior to Admission medications   Medication Sig Start Date End Date Taking? Authorizing Provider  clotrimazole (LOTRIMIN) 1 % cream Apply to affected area 2 times daily 08/10/18   Ashley Murrain, NP  cyclobenzaprine (FLEXERIL) 10 MG tablet Take 1 tablet (10 mg total) by mouth 2 (two) times daily as needed for muscle spasms. 08/10/18   Ashley Murrain, NP  naproxen (NAPROSYN) 375 MG tablet Take 1 tablet (375 mg total) by mouth 2 (two) times daily. 08/10/18   Ashley Murrain, NP  predniSONE (DELTASONE) 10 MG tablet Take 4 tablets (40 mg total) by mouth daily with breakfast. 08/10/18   Ashley Murrain, NP    Family History No family history on file.  Social History Social History   Tobacco Use  . Smoking status: Current Some Day Smoker    Packs/day: 0.50    Types: Cigarettes  . Smokeless tobacco: Current User  Substance Use Topics  . Alcohol use: Yes    Alcohol/week: 12.0 standard drinks    Types: 12 Cans of beer per week    Comment: social  . Drug use: No     Allergies   Patient has no known allergies.   Review of Systems Review of Systems  HENT: Positive for dental problem.     Musculoskeletal: Positive for arthralgias.  Skin: Positive for rash.  All other systems reviewed and are negative.    Physical Exam Updated Vital Signs BP (!) 154/110   Pulse 76   Temp 98.6 F (37 C) (Oral)   Resp 18   SpO2 99%   Physical Exam  Constitutional: He appears well-developed and well-nourished. No distress.  HENT:  Head: Normocephalic.  Eyes: EOM are normal.  Neck: Neck supple.  Cardiovascular: Normal rate.  Pulmonary/Chest: Effort normal.  Musculoskeletal: Normal range of motion.  Neurological: He is alert.  Skin: Skin is warm and dry.  Psychiatric: He has a normal mood and affect.  Nursing note and vitals reviewed.  Patient declined any further exam stating everything is the same as it was his last visit and he does not need re exam just him Rx.    ED Treatments / Results  Labs (all labs ordered are listed, but only abnormal results are displayed) Labs Reviewed - No data to display Radiology No results found.  Procedures Procedures (including critical care time)  Medications Ordered in ED Medications  HYDROcodone-acetaminophen (NORCO/VICODIN) 5-325 MG per tablet 1 tablet (has no administration in time range)     Initial Impression / Assessment and  Plan / ED Course  I have reviewed the triage vital signs and the nursing notes. 57 y.o. male here with request for medication refill since he last his Rx and has not had any medication. Discussed with the patient elevated BP and he states it is due to the tooth extraction and severe pain. Previous chart reviewed and patient had a normal BP at that time. Encouraged patient to have f/u with PCP. Patient appears stable for d/c.  Final Clinical Impressions(s) / ED Diagnoses   Final diagnoses:  Medication refill    ED Discharge Orders         Ordered    clotrimazole (LOTRIMIN) 1 % cream     08/10/18 1140    cyclobenzaprine (FLEXERIL) 10 MG tablet  2 times daily PRN     08/10/18 1140    naproxen  (NAPROSYN) 375 MG tablet  2 times daily     08/10/18 1140    predniSONE (DELTASONE) 10 MG tablet  Daily with breakfast     08/10/18 [address] [place], NP 08/10/18 1211    Noemi Chapel, MD 08/11/18 2202

## 2018-08-10 NOTE — ED Notes (Signed)
Pt not in room.

## 2018-08-10 NOTE — ED Notes (Signed)
Pt states he understand instructions. Home stable with steady gait.

## 2018-08-10 NOTE — ED Notes (Signed)
Pt requesting dental gauze and buspass . Same given

## 2018-08-10 NOTE — Discharge Instructions (Signed)
Follow up with your doctor

## 2018-08-10 NOTE — ED Triage Notes (Signed)
Pt here for refill of medications he received last week but lost the prescriptions. States it was prednisone, "a cream for jock itch and something else"

## 2018-08-13 MED FILL — AMOXICILLIN 500 MG CAPSULE: 500 | 14 days supply | Qty: 56 | Fill #0

## 2018-08-13 MED FILL — IBUPROFEN 800 MG TABLET: 800 | 7 days supply | Qty: 28 | Fill #0

## 2018-08-14 ENCOUNTER — Ambulatory Visit (INDEPENDENT_AMBULATORY_CARE_PROVIDER_SITE_OTHER): Payer: Self-pay | Admitting: Physician Assistant

## 2018-09-18 ENCOUNTER — Other Ambulatory Visit: Payer: Self-pay

## 2018-09-18 ENCOUNTER — Emergency Department (HOSPITAL_COMMUNITY): Payer: Self-pay

## 2018-09-18 ENCOUNTER — Encounter (HOSPITAL_COMMUNITY): Payer: Self-pay

## 2018-09-18 ENCOUNTER — Emergency Department (HOSPITAL_COMMUNITY)
Admission: EM | Admit: 2018-09-18 | Discharge: 2018-09-18 | Disposition: A | Discharge status: Home / Self Care | Payer: Self-pay | Attending: Emergency Medicine | Admitting: Emergency Medicine

## 2018-09-18 ENCOUNTER — Ambulatory Visit (INDEPENDENT_AMBULATORY_CARE_PROVIDER_SITE_OTHER): Payer: Self-pay | Admitting: Physician Assistant

## 2018-09-18 DIAGNOSIS — R05 Cough: Secondary | ICD-10-CM | POA: Insufficient documentation

## 2018-09-18 DIAGNOSIS — J029 Acute pharyngitis, unspecified: Secondary | ICD-10-CM | POA: Insufficient documentation

## 2018-09-18 DIAGNOSIS — R0981 Nasal congestion: Secondary | ICD-10-CM | POA: Insufficient documentation

## 2018-09-18 DIAGNOSIS — R6889 Other general symptoms and signs: Secondary | ICD-10-CM

## 2018-09-18 DIAGNOSIS — R51 Headache: Secondary | ICD-10-CM | POA: Insufficient documentation

## 2018-09-18 DIAGNOSIS — Z79899 Other long term (current) drug therapy: Secondary | ICD-10-CM | POA: Insufficient documentation

## 2018-09-18 DIAGNOSIS — R6883 Chills (without fever): Secondary | ICD-10-CM | POA: Insufficient documentation

## 2018-09-18 DIAGNOSIS — F17210 Nicotine dependence, cigarettes, uncomplicated: Secondary | ICD-10-CM | POA: Insufficient documentation

## 2018-09-18 MED ORDER — OSELTAMIVIR PHOSPHATE 75 MG PO CAPS: 75.0000 mg | ORAL_CAPSULE | Freq: Two times a day (BID) | ORAL | 0 refills | Status: AC

## 2018-09-18 MED ORDER — ACETAMINOPHEN 325 MG PO TABS
650.0000 mg | ORAL_TABLET | Freq: Once | ORAL | Status: AC
  Administered 2018-09-18: 650 mg via ORAL
  Filled 2018-09-18: qty 2

## 2018-09-18 MED ORDER — BENZONATATE 100 MG PO CAPS: 100.0000 mg | ORAL_CAPSULE | Freq: Three times a day (TID) | ORAL | 0 refills | Status: DC

## 2018-09-18 MED ORDER — GUAIFENESIN ER 600 MG PO TB12: 600.0000 mg | ORAL_TABLET | Freq: Two times a day (BID) | ORAL | 0 refills | Status: DC

## 2018-09-18 NOTE — ED Triage Notes (Signed)
Pt reports flu like symptoms (productive cough, headache, diarrhea) that started yesterday. nad noted in triage

## 2018-09-18 NOTE — ED Notes (Signed)
Pt was in [place]. Tech taking Pt to RM now

## 2018-09-18 NOTE — ED Provider Notes (Signed)
[facility] EMERGENCY DEPARTMENT Provider Note   CSN: [id number] Arrival date & time: 09/18/18  1615     History   Chief Complaint Chief Complaint  Patient presents with  . Cough  . Headache    HPI Randy Hansen is a 57 y.o. male.  HPI   Pt is a 57 y/o male who presents to the ED today c/o a dry cough that began yesterday. He c/o sweats, chills, headache (with cough), sneezing, rhinorrhea, congestion, sore throat. He reports that he chest burns when he coughs.  He also c/o diarrhea that has been improving. He reports nausea but no vomiting.  States that he was exposed to sick contacts with similar sxs.  Past Medical History:  Diagnosis Date  . Acute medial meniscal tear     There are no active problems to display for this patient.   Past Surgical History:  Procedure Laterality Date  . NO PAST SURGERIES          Home Medications    Prior to Admission medications   Medication Sig Start Date End Date Taking? Authorizing Provider  benzonatate (TESSALON) 100 MG capsule Take 1 capsule (100 mg total) by mouth every 8 (eight) hours. 09/18/18   Randy Hansen S, PA-C  clotrimazole (LOTRIMIN) 1 % cream Apply to affected area 2 times daily 08/10/18   Debroah Baller M, NP  cyclobenzaprine (FLEXERIL) 10 MG tablet Take 1 tablet (10 mg total) by mouth 2 (two) times daily as needed for muscle spasms. 08/10/18   Ashley Murrain, NP  guaiFENesin (MUCINEX) 600 MG 12 hr tablet Take 1 tablet (600 mg total) by mouth 2 (two) times daily. 09/18/18   Nathifa Ritthaler S, PA-C  naproxen (NAPROSYN) 375 MG tablet Take 1 tablet (375 mg total) by mouth 2 (two) times daily. 08/10/18   Ashley Murrain, NP  oseltamivir (TAMIFLU) 75 MG capsule Take 1 capsule (75 mg total) by mouth every 12 (twelve) hours for 5 days. 09/18/18 09/23/18  Dorrien Grunder S, PA-C  predniSONE (DELTASONE) 10 MG tablet Take 4 tablets (40 mg total) by mouth daily with breakfast. 08/10/18   Ashley Murrain, NP     Family History No family history on file.  Social History Social History   Tobacco Use  . Smoking status: Current Some Day Smoker    Packs/day: 0.50    Types: Cigarettes  . Smokeless tobacco: Current User  Substance Use Topics  . Alcohol use: Yes    Alcohol/week: 12.0 standard drinks    Types: 12 Cans of beer per week    Comment: social  . Drug use: No     Allergies   Patient has no known allergies.   Review of Systems Review of Systems  Constitutional: Positive for chills, diaphoresis and fever.  HENT: Positive for congestion, rhinorrhea and sore throat. Negative for ear pain.   Eyes: Negative for pain and visual disturbance.  Respiratory: Positive for cough. Negative for shortness of breath.   Cardiovascular: Negative for chest pain and leg swelling.  Gastrointestinal: Positive for diarrhea and nausea. Negative for abdominal pain, constipation and vomiting.  Genitourinary: Negative for dysuria and hematuria.  Musculoskeletal: Negative for back pain.  Skin: Negative for color change and rash.  Neurological: Positive for headaches.  All other systems reviewed and are negative.  Physical Exam Updated Vital Signs BP (!) 132/97 (BP Location: Right Arm)   Pulse (!) 108   Temp (!) 102.6 F (39.2 C) (Oral)   Resp  20   SpO2 95%   Physical Exam Vitals signs and nursing note reviewed.  Constitutional:      General: He is not in acute distress.    Appearance: He is well-developed. He is not ill-appearing or toxic-appearing.     Comments: Sleeping comfortably when I enter the room. Easily arousable.  HENT:     Head: Normocephalic and atraumatic.  Eyes:     Conjunctiva/sclera: Conjunctivae normal.  Neck:     Musculoskeletal: Neck supple.  Cardiovascular:     Rate and Rhythm: Normal rate and regular rhythm.     Heart sounds: Normal heart sounds. No murmur.  Pulmonary:     Effort: Pulmonary effort is normal. No respiratory distress.     Breath sounds: Normal  breath sounds. No stridor. No wheezing or rhonchi.  Abdominal:     General: Bowel sounds are normal.     Palpations: Abdomen is soft.     Tenderness: There is no abdominal tenderness.  Skin:    General: Skin is warm and dry.  Neurological:     Mental Status: He is alert.      ED Treatments / Results  Labs (all labs ordered are listed, but only abnormal results are displayed) Labs Reviewed - No data to display  EKG None  Radiology Dg Chest 2 View  Result Date: 09/18/2018 CLINICAL DATA:  Productive cough EXAM: CHEST - 2 VIEW COMPARISON:  10/01/2017 FINDINGS: The heart size and mediastinal contours are within normal limits. Both lungs are clear. The visualized skeletal structures are unremarkable. IMPRESSION: No active cardiopulmonary disease. Electronically Signed   By: Ashley Royalty M.D.   On: 09/18/2018 18:11    Procedures Procedures (including critical care time)  Medications Ordered in ED Medications  acetaminophen (TYLENOL) tablet 650 mg (650 mg Oral Given 09/18/18 2037)     Initial Impression / Assessment and Plan / ED Course  I have reviewed the triage vital signs and the nursing notes.  Pertinent labs & imaging results that were available during my care of the patient were reviewed by me and considered in my medical decision making (see chart for details).   Final Clinical Impressions(s) / ED Diagnoses   Final diagnoses:  Flu-like symptoms   Patient with symptoms consistent with influenza.  Vitals are stable, thought mildly tachycardic with fever present. Tylenol given.  No signs of dehydration, tolerating PO's.  Lungs are clear. CXR negative for pneumonia. Discussed the cost versus benefit of Tamiflu treatment with the patient.  discussed adverse side effect profile of tamiflu. Patient will be discharged with instructions to orally hydrate, rest, and use over-the-counter medications such as anti-inflammatories ibuprofen and Aleve for muscle aches and Tylenol for  fever.  Patient will also be given a cough suppressant and tamiflu. Advised pcp f/u next week and return to the ED if no improvement or worsening sxs. Pt voices understanding of the plan and reasons to return. All questions answered.   ED Discharge Orders         Ordered    oseltamivir (TAMIFLU) 75 MG capsule  Every 12 hours     09/18/18 2046    benzonatate (TESSALON) 100 MG capsule  Every 8 hours     09/18/18 2046    guaiFENesin (MUCINEX) 600 MG 12 hr tablet  2 times daily     09/18/18 2046           Rodney Booze, PA-C 09/18/18 2047    Deno Etienne, DO 09/20/18 1510

## 2018-09-18 NOTE — Discharge Instructions (Addendum)
You should follow up with your primary healthcare provider within the next 3-5 days for reevaluation.  You will need to return to the emergency department immediately if you experience any of the following symptoms:  Difficulty breathing or shortness or breath Pain or pressure in the chest or abdomen Sudden dizziness Confusion Severe or persistent vomiting Flu-like symptoms that improve but then return with fever or worse cough  You should stay home for at least 24 hours after your fever is gone except to get medical care or other necessities. Your fever should be gone without the need to use a fever-reducing medicine, such as Tylenol or Motrin. Until then, you should stay home from work, school, travel, shopping, social events, and public gatherings.  Stay away from others as much as possible to keep from infecting them. If you must leave home, for example to get medical care, wear a facemask if you have one, or cover coughs and sneezes with a tissue. Wash your hands often to keep from spreading flu to others

## 2018-09-18 NOTE — ED Notes (Signed)
Called for Pt to Room. No answer

## 2018-10-12 ENCOUNTER — Emergency Department (HOSPITAL_COMMUNITY)
Admission: EM | Admit: 2018-10-12 | Discharge: 2018-10-12 | Disposition: A | Discharge status: Home / Self Care | Payer: Self-pay | Attending: Emergency Medicine | Admitting: Emergency Medicine

## 2018-10-12 ENCOUNTER — Encounter (HOSPITAL_COMMUNITY): Payer: Self-pay | Admitting: *Deleted

## 2018-10-12 ENCOUNTER — Other Ambulatory Visit: Payer: Self-pay

## 2018-10-12 DIAGNOSIS — J069 Acute upper respiratory infection, unspecified: Secondary | ICD-10-CM | POA: Insufficient documentation

## 2018-10-12 DIAGNOSIS — F17210 Nicotine dependence, cigarettes, uncomplicated: Secondary | ICD-10-CM | POA: Insufficient documentation

## 2018-10-12 DIAGNOSIS — B9789 Other viral agents as the cause of diseases classified elsewhere: Secondary | ICD-10-CM | POA: Insufficient documentation

## 2018-10-12 DIAGNOSIS — Z79899 Other long term (current) drug therapy: Secondary | ICD-10-CM | POA: Insufficient documentation

## 2018-10-12 MED ORDER — IBUPROFEN 400 MG PO TABS
600.0000 mg | ORAL_TABLET | Freq: Once | ORAL | Status: AC
  Administered 2018-10-12: 600 mg via ORAL
  Filled 2018-10-12: qty 1

## 2018-10-12 MED ORDER — BENZONATATE 100 MG PO CAPS: 100.0000 mg | ORAL_CAPSULE | Freq: Three times a day (TID) | ORAL | 0 refills | Status: DC

## 2018-10-12 MED ORDER — BENZONATATE 100 MG PO CAPS
100.0000 mg | ORAL_CAPSULE | Freq: Once | ORAL | Status: AC
  Administered 2018-10-12: 100 mg via ORAL
  Filled 2018-10-12: qty 1

## 2018-10-12 MED FILL — BENZONATATE 100 MG CAP: 100 | 7 days supply | Qty: 21 | Fill #0

## 2018-10-12 NOTE — ED Provider Notes (Addendum)
[facility] EMERGENCY DEPARTMENT Provider Note   CSN: [id number] Arrival date & time: 10/12/18  0815   History   Chief Complaint Chief Complaint  Patient presents with  . Cough    HPI RAMELLO CORDIAL is a 58 y.o. male presenting with a constant dry cough, rhinorrhea, and congestion onset last night. Patient states nothing makes his symptoms better or worse. Patient reports he tried Theraflu last night without relief. Patient reports an associated intermittent eye itching, sore throat, and mild frontal headache. Patient reports sick contacts by his brother. Patient states he had influenza a few weeks ago, but symptoms completely resolved until yesterday. Patient denies shortness of breath, abdominal pain, nausea, vomiting, or fever.   HPI  Past Medical History:  Diagnosis Date  . Acute medial meniscal tear     There are no active problems to display for this patient.   Past Surgical History:  Procedure Laterality Date  . NO PAST SURGERIES          Home Medications    Prior to Admission medications   Medication Sig Start Date End Date Taking? Authorizing Provider  benzonatate (TESSALON) 100 MG capsule Take 1 capsule (100 mg total) by mouth every 8 (eight) hours. 10/12/18   Darlin Drop P, PA-C  clotrimazole (LOTRIMIN) 1 % cream Apply to affected area 2 times daily 08/10/18   Ashley Murrain, NP  cyclobenzaprine (FLEXERIL) 10 MG tablet Take 1 tablet (10 mg total) by mouth 2 (two) times daily as needed for muscle spasms. 08/10/18   Ashley Murrain, NP  guaiFENesin (MUCINEX) 600 MG 12 hr tablet Take 1 tablet (600 mg total) by mouth 2 (two) times daily. 09/18/18   Couture, Cortni S, PA-C  naproxen (NAPROSYN) 375 MG tablet Take 1 tablet (375 mg total) by mouth 2 (two) times daily. 08/10/18   Ashley Murrain, NP  predniSONE (DELTASONE) 10 MG tablet Take 4 tablets (40 mg total) by mouth daily with breakfast. 08/10/18   Ashley Murrain, NP    Family History History  reviewed. No pertinent family history.  Social History Social History   Tobacco Use  . Smoking status: Current Some Day Smoker    Packs/day: 0.50    Types: Cigarettes  . Smokeless tobacco: Current User  Substance Use Topics  . Alcohol use: Yes    Alcohol/week: 12.0 standard drinks    Types: 12 Cans of beer per week    Comment: social  . Drug use: No     Allergies   Patient has no known allergies.   Review of Systems Review of Systems  Constitutional: Negative for activity change, appetite change, fatigue and fever.  HENT: Positive for congestion, rhinorrhea and sore throat. Negative for ear pain and postnasal drip.   Eyes: Positive for itching. Negative for pain and redness.  Respiratory: Positive for cough. Negative for shortness of breath.   Cardiovascular: Negative for chest pain.  Gastrointestinal: Negative for abdominal pain, diarrhea, nausea and vomiting.  Musculoskeletal: Negative for myalgias.  Skin: Negative for rash.  Allergic/Immunologic: Negative for environmental allergies and immunocompromised state.  Neurological: Positive for headaches. Negative for dizziness, syncope, speech difficulty, weakness and numbness.     Physical Exam Updated Vital Signs BP (!) 165/105 (BP Location: Right Arm)   Pulse 82   Temp 98.3 F (36.8 C) (Oral)   Resp 18   SpO2 96%   Physical Exam Vitals signs and nursing note reviewed.  Constitutional:      General:  He is not in acute distress.    Appearance: He is well-developed. He is not diaphoretic.  HENT:     Head: Normocephalic and atraumatic.     Right Ear: Tympanic membrane and external ear normal. No middle ear effusion.     Left Ear: Tympanic membrane and external ear normal.  No middle ear effusion.     Nose: Mucosal edema, congestion and rhinorrhea present.     Mouth/Throat:     Pharynx: Uvula midline. No oropharyngeal exudate or posterior oropharyngeal erythema.  Eyes:     General:        Right eye: No  discharge.        Left eye: No discharge.     Extraocular Movements: Extraocular movements intact.     Conjunctiva/sclera: Conjunctivae normal.     Pupils: Pupils are equal, round, and reactive to light.  Neck:     Musculoskeletal: Normal range of motion and neck supple.  Cardiovascular:     Rate and Rhythm: Normal rate and regular rhythm.     Heart sounds: Normal heart sounds. No murmur. No friction rub. No gallop.   Pulmonary:     Effort: Pulmonary effort is normal. No respiratory distress.     Breath sounds: Normal breath sounds. No wheezing or rales.  Abdominal:     Palpations: Abdomen is soft.     Tenderness: There is no abdominal tenderness.  Musculoskeletal: Normal range of motion.  Lymphadenopathy:     Cervical: No cervical adenopathy.  Skin:    General: Skin is warm.     Findings: No rash.  Neurological:     Mental Status: He is alert and oriented to person, place, and time.      ED Treatments / Results  Labs (all labs ordered are listed, but only abnormal results are displayed) Labs Reviewed - No data to display  EKG None  Radiology No results found.  Procedures Procedures (including critical care time)  Medications Ordered in ED Medications  ibuprofen (ADVIL,MOTRIN) tablet 600 mg (600 mg Oral Given 10/12/18 0911)  benzonatate (TESSALON) capsule 100 mg (100 mg Oral Given 10/12/18 1028)     Initial Impression / Assessment and Plan / ED Course  I have reviewed the triage vital signs and the nursing notes.  Pertinent labs & imaging results that were available during my care of the patient were reviewed by me and considered in my medical decision making (see chart for details).    Patients symptoms are consistent with URI, likely viral etiology. Discussed that antibiotics are not indicated for viral infections. Provided ibuprofen while in the ER. Pt will be discharged with symptomatic treatment.  Pt is requesting information at homeless shelters. Consulted  social work. Verbalizes understanding and is agreeable with plan. Discussed returning to ER for new or worsening symptoms. Pt is hemodynamically stable & in NAD prior to dc.  Final Clinical Impressions(s) / ED Diagnoses   Final diagnoses:  Viral URI with cough    ED Discharge Orders         Ordered    benzonatate (TESSALON) 100 MG capsule  Every 8 hours     10/12/18 0906              Arville Lime, PA-C 10/12/18 Frankton, Dan, DO 10/12/18 1323

## 2018-10-12 NOTE — ED Notes (Signed)
Got patient on the monitor got vitals patient is resting with call bell in reach

## 2018-10-12 NOTE — ED Triage Notes (Signed)
Pt reports nonproductive cough, chest congestion and URI symptoms since last night.

## 2018-10-12 NOTE — Discharge Instructions (Addendum)
At this time you appear to have a viral upper respiratory infection.  Treatment of symptoms is the best plan.  This includes tylenol or motrin for aches and pains, gargling with salt water for sore throat, increased fluid intake, especially hot drinks to soothe the throat.  Over the counter medications that include decongestants and cough suppressants may also help.  Expect to have symptoms for 5-10 days.  Return to the ER for worsening condition or new concerning symptoms.  1. Medications: tessalon for cough, usual home medications 2. Treatment: rest, drink plenty of fluids, take tylenol or ibuprofen for pain control 3. Follow Up: Please follow up with your primary doctor in 3 days for discussion of your diagnoses and further evaluation after today's visit; if you do not have a primary care doctor use the resource guide provided to find one; Return to the ER for high fevers, difficulty breathing or other concerning symptoms  Continue to stay well-hydrated. Gargle warm salt water and spit it out for sore throat. Take benadryl or other antihistamine to decrease secretions and for watery itchy eyes. Continued to alternate between Tylenol and ibuprofen for pain. Follow up with your primary care doctor in 5-7 days or referral for urgent care for recheck of ongoing symptoms however return to emergency department for emergent changing or worsening of symptoms.   Read the instructions below on reasons to return to the emergency department and to learn more about your diagnosis.  Use over the counter medications for symptomatic relief as we discussed (mucinex as a decongestant, Tylenol for fever/pain, Motrin/Ibuprofen for muscle aches). If prescribed a cough suppressant during your visit, do not operate heavy machinery with in 5 hours of taking this medication. Follow up with your primary care doctor in 4 days if your symptoms persist.  Your more than welcome to return to the emergency department if symptoms worsen  or become concerning.  Upper Respiratory Infection, Adult  An upper respiratory infection (URI) is also sometimes known as the common cold. Most people improve within 1 week, but symptoms can last up to 2 weeks. A residual cough may last even longer.   URI is most commonly caused by a virus. Viruses are NOT treated with antibiotics. You can easily spread the virus to others by oral contact. This includes kissing, sharing a glass, coughing, or sneezing. Touching your mouth or nose and then touching a surface, which is then touched by another person, can also spread the virus.   TREATMENT  Treatment is directed at relieving symptoms. There is no cure. Antibiotics are not effective, because the infection is caused by a virus, not by bacteria. Treatment may include:  Increased fluid intake. Sports drinks offer valuable electrolytes, sugars, and fluids.  Breathing heated mist or steam (vaporizer or shower).  Eating chicken soup or other clear broths, and maintaining good nutrition.  Getting plenty of rest.  Using gargles or lozenges for comfort.  Controlling fevers with ibuprofen or acetaminophen as directed by your caregiver.  Increasing usage of your inhaler if you have asthma.  Return to work when your temperature has returned to normal.   SEEK MEDICAL CARE IF:  After the first few days, you feel you are getting worse rather than better.  You develop worsening shortness of breath, or brown or red sputum. These may be signs of pneumonia.  You develop yellow or brown nasal discharge or pain in the face, especially when you bend forward. These may be signs of sinusitis.  You develop a  fever, swollen neck glands, pain with swallowing, or white areas in the back of your throat. These may be signs of strep throat.  Severe headache or stiff neck. Uncontrolled vomiting. Lightheadedness, feeling faint, or if you pass out. Problems breathing or shortness of breath. Weakness or inability to walk. Any  other concerning symptoms or concerns. If not improving over 7 days or if feeling worse at any time.  RESOURCE GUIDE  Dental Problems  Patients with Medicaid: Merna Lawrence [facility] Phone:  [phone]                                                                             Phone:  [phone]  If unable to pay or uninsured, contact:  [facility] or [facility]. to become qualified for the adult dental clinic.  Chronic Pain Problems Contact [facility]  [phone] Patients need to be referred by their primary care doctor.  Insufficient Money for Medicine Contact [employer]:  call "211" or [place] [phone].  No Primary Care Doctor Call [facility]  [phone] Other agencies that provide inexpensive medical care    [place]  [phone]    [facility]  [place]  [phone]    [facility]  [phone]    [facility]  [place]  [phone] [facility]  [phone] [place]   [phone] (emergency services [phone])  Abuse/Neglect [place] [phone] [place] [phone] (After Hours)  Emergency [place] [phone]  [place] at the [place] [phone] [place] [phone]  MRSA Hotline #:   (407)003-7778  [facility] Dept. [address]          [place]  Sela Hua Phone:  [phone]                                     Phone:  [phone]                   Phone:  [place] Phone:  [place] [phone] (After Hours)   If you develop symptoms of Shortness of Breath, Chest Pain, Swelling of lips, mouth or tongue or if your condition becomes worse with any new symptoms, see your doctor or return to the Emergency Department for immediate care. Emergency services are not intended to be a substitute for comprehensive medical attention.  Please contact your doctor for follow up if not improving as expected.   Call your doctor in 5-7 days or as directed if there is no improvement.   Community Resources: *IF YOU ARE IN IMMEDIATE DANGER CALL 911!  Abuse/Neglect:  Family Services Crisis Hotline [facility]): [phone] Center Against Violence [facility]): [phone]  After hours, holidays and weekends: [phone] National Domestic Violence Hotline: [place]: [place]: Dannielle Karvonen: 574-260-3782  Health Clinics:  Urgent [place] ([place]): [phone] Monday - Friday 8 AM - 9 PM, Saturday and Sunday 10 AM - 9 PM  [facility] [place]: [phone] Monday - Friday 8 AM - 5 PM  [facility]  [address]: [phone] Monday- Friday 8:30 AM - 5:30 PM, Sat 9 AM - 1 PM  24 HR [place] [facility] [address]: [phone] [facility] [address]: [phone] [facility] [address]: [phone]  24 [facility] [place] [facility]: [address] [phone]  Cultures: If culture results are positive, we will notify you if a change in treatment is necessary.  LABORATORY TESTS:         If you had any labs drawn in the ED that have not resulted by the time you are  discharged home, we will review these lab results and the treatment given to you.  If there is any further treatment or notification needed, we will contact you by phone, or letter.  "PLEASE ENSURE THAT YOU HAVE GIVEN US YOUR CURRENT WORKING PHONE NUMBER AND YOUR CURRENT ADDRESS, so that we can contact you if needed."  RADIOLOGY TESTS:  If the referred physician wants todays x-rays, please call the hospitals Radiology Department the day before your doctors appointment. [place]     [phone] [place]   [phone] [place]     [phone]  Our doctors and staff appreciate your choosing [place] for your emergency medical care needs. We are here to serve you.

## 2018-10-12 NOTE — Progress Notes (Signed)
CSW spoke with pt at bedside who expressed that pt is homeless. CSW was advised that per pt he was suppose to go to the [facility] for an intake today however needed a note from the [facility] expressing that pt is homeless. CSW advsied pt that CSW is unable to provide this to pt as CSW has limited information on pt. Pt was very understanding and asked I Scientist, research (medical) could call CSW. CSW agreeable to speak with staff if called as pt gave verbal permission for CSW to speak with them.   There are no further CSW needs at this time. CSW to sign off.     Virgie Dad. Landyn Buckalew, MSW, [facility] Clinical Social Worker [phone]

## 2018-11-12 ENCOUNTER — Encounter: Payer: Self-pay | Admitting: Family Medicine

## 2018-11-12 ENCOUNTER — Ambulatory Visit (INDEPENDENT_AMBULATORY_CARE_PROVIDER_SITE_OTHER): Payer: Self-pay | Admitting: Family Medicine

## 2018-11-12 VITALS — BP 135/88 | HR 73 | Temp 97.9°F | Resp 18 | Ht 69.5 in | Wt 226.6 lb

## 2018-11-12 DIAGNOSIS — Z113 Encounter for screening for infections with a predominantly sexual mode of transmission: Secondary | ICD-10-CM

## 2018-11-12 DIAGNOSIS — Z23 Encounter for immunization: Secondary | ICD-10-CM

## 2018-11-12 DIAGNOSIS — Z2839 Other underimmunization status: Secondary | ICD-10-CM

## 2018-11-12 DIAGNOSIS — K047 Periapical abscess without sinus: Secondary | ICD-10-CM

## 2018-11-12 DIAGNOSIS — Z9189 Other specified personal risk factors, not elsewhere classified: Secondary | ICD-10-CM

## 2018-11-12 MED ORDER — CLINDAMYCIN HCL 300 MG PO CAPS: 300.0000 mg | ORAL_CAPSULE | Freq: Three times a day (TID) | ORAL | 0 refills | Status: AC

## 2018-11-12 MED FILL — CLINDAMYCIN HCL 300 MG CAP: 300 | 10 days supply | Qty: 30 | Fill #0

## 2018-11-12 NOTE — Progress Notes (Signed)
Randy Hansen, is a 58 y.o. male  YPP:[id number]  WPY:[id number]  DOB - 03/23/1961  CC:  Chief Complaint  Patient presents with  . Establish Care  . Dental Pain       HPI: Randy Hansen is a 58 y.o. male is here today to establish care, STD testing , and dental infection with pain.  Patient with currently residing at [facility], uninsured presents today for evaluation of dental pain from infected gums. He reports he is a patient at [facility] clinic and is suppose to have extensive dental work performed. He was declined further services as he had appeared to have dental infection and needs antibiotics prior to any additional procedures being performed. He was previously prescribed antibiotics and did not complete course. Describes poor oral odor and generalized mouth pain. Denies fever, nausea, or vomiting.  He also requests STD testing. No known exposures. He is sexually active and reports with one partner.   Randy Hansen does not have a problem list on file.   Current medications:No current outpatient medications on file.   Pertinent family medical history: family history is not on file.   No Known Allergies  Social History   Socioeconomic History  . Marital status: Single    Spouse name: Not on file  . Number of children: Not on file  . Years of education: Not on file  . Highest education level: Not on file  Occupational History  . Not on file  Social Needs  . Financial resource strain: Not on file  . Food insecurity:    Worry: Not on file    Inability: Not on file  . Transportation needs:    Medical: Not on file    Non-medical: Not on file  Tobacco Use  . Smoking status: Current Some Day Smoker    Packs/day: 0.50    Types: Cigarettes  . Smokeless tobacco: Current User  Substance and Sexual Activity  . Alcohol use: Yes    Alcohol/week: 12.0 standard drinks    Types: 12 Cans of beer per week    Comment: social  . Drug use: No  . Sexual activity: Yes   Lifestyle  . Physical activity:    Days per week: Not on file    Minutes per session: Not on file  . Stress: Not on file  Relationships  . Social connections:    Talks on phone: Not on file    Gets together: Not on file    Attends religious service: Not on file    Active member of club or organization: Not on file    Attends meetings of clubs or organizations: Not on file    Relationship status: Not on file  . Intimate partner violence:    Fear of current or ex partner: Not on file    Emotionally abused: Not on file    Physically abused: Not on file    Forced sexual activity: Not on file  Other Topics Concern  . Not on file  Social History Narrative  . Not on file    Review of Systems: Pertinent negatives listed in HPI  Objective:   Vitals:   11/12/18 1011  BP: 135/88  Pulse: 73  Resp: 18  Temp: 97.9 F (36.6 C)  SpO2: 96%    BP Readings from Last 3 Encounters:  11/12/18 135/88  10/12/18 (!) 165/105  09/18/18 135/89    Filed Weights   11/12/18 1011  Weight: 226 lb 9.6 oz (102.8 kg)  Physical Exam: Constitutional: Patient appears well-developed and well-nourished. No distress. HENT: Normocephalic, atraumatic, External right and left ear normal.  Oral mucosa-poor dentition  Eyes: Conjunctivae and EOM are normal. PERRLA, no scleral icterus. Neck: Normal ROM. Neck supple. No JVD. No tracheal deviation. No thyromegaly. CVS: RRR, S1/S2 +, no murmurs, no gallops, no carotid bruit.  Pulmonary: Effort and breath sounds normal, no stridor, rhonchi, wheezes, rales.  Abdominal: Soft. BS +, no distension, tenderness, rebound or guarding.  Musculoskeletal: Normal range of motion. No edema and no tenderness.  Neuro: Alert. Normal muscle tone coordination. Normal gait.  Skin: Skin is warm and dry. No rash noted. Not diaphoretic. No erythema. No pallor. Psychiatric: Normal mood and affect. Behavior, judgment, thought content normal.  Lab Results (prior encounters)   Lab Results  Component Value Date   WBC 9.9 10/01/2017   HGB 16.0 10/01/2017   HCT 46.2 10/01/2017   MCV 89.7 10/01/2017   PLT 282 10/01/2017   Lab Results  Component Value Date   CREATININE 1.20 10/01/2017   BUN 13 10/01/2017   NA 136 10/01/2017   K 3.7 10/01/2017   CL 103 10/01/2017   CO2 21 (L) 10/01/2017    No results found for: HGBA1C     Component Value Date/Time   CHOL 206 (H) 02/28/2017 1116   TRIG 192 (H) 02/28/2017 1116   HDL 81 02/28/2017 1116   CHOLHDL 2.5 02/28/2017 1116   LDLCALC 87 02/28/2017 1116        Assessment and plan:  1. Screen for STD (sexually transmitted disease) - GC/Chlamydia Probe Amp([facility]) - RPR - HIV antibody (with reflex)  2. Immunizations incomplete Pneumococcal vaccine given 23-patient is chronic smoker   3. Dental infection Clindamycin 3 times daily, 10 days  Return in about 6 weeks (around 12/24/2018) for Complete Physical Exam with fastng labs.   The patient was given clear instructions to go to ER or return to medical center if symptoms don't improve, worsen or new problems develop. The patient verbalized understanding. The patient was advised  to call and obtain lab results if they haven't heard anything from out office within 7-10 business days.  Molli Barrows, FNP Primary Care at [facility] [address] [place] [address] [phone]: [phone]    This note has been created with Dragon speech recognition software and Engineer, materials. Any transcriptional errors are unintentional.

## 2018-11-12 NOTE — Patient Instructions (Addendum)
Thank you for choosing Primary Care at [facility] for your medical home!    Randy Hansen was seen by Molli Barrows, FNP today.   Randy Hansen's primary care doctor is Scot Jun, FNP.   For the best care possible,  you should try to see Molli Barrows, FNP-C  whenever you come to clinic.   We look forward to seeing you again soon!  If you have any questions about your visit today,  please call us at [phone]  Or feel free to reach your provider via [place].      Dental Caries  Dental caries are spots of decay (cavities) in teeth. They are in the outer layer of your tooth (enamel). Treat them as soon as you can. If they are not treated, they can spread decay and lead to painful infection. Follow these instructions at home: General instructions  Take good care of your mouth and teeth. This keeps them healthy. ? Brush your teeth 2 times a day. Use toothpaste with fluoride in it. ? Floss your teeth once a day.  If your dentist prescribed an antibiotic medicine to treat an infection, take it as told. Do not stop taking the antibiotic even if your condition gets better.  Keep all follow-up visits as told by your dentist. This is important. This includes all cleanings. Preventing dental caries   Brush your teeth every morning and night. Use fluoride toothpaste.  Get regular dental cleanings.  If you are at risk of dental caries. ? Wash your mouth with prescription mouthwash (chlorhexidine). ? Put topical fluoride on your teeth.  Drink water with fluoride in it.  Drink water instead of sugary drinks.  Eat healthy meals and snacks. Contact a doctor if:  You have symptoms of tooth decay. Summary  Dental caries are spots of decay (cavities) in teeth. They are in the outer layer of your tooth.  Take an antibiotic to treat an infection, if told by your dentist. Do not stop taking the antibiotic even if your condition gets better.  Regular dental  cleanings and brushing can help prevent dental caries. This information is not intended to replace advice given to you by your health care provider. Make sure you discuss any questions you have with your health care provider. Document Released: 06/18/2008 Document Revised: 05/26/2016 Document Reviewed:  Dental Abscess  A dental abscess is an area of pus in or around a tooth. It comes from an infection. It can cause pain and other symptoms. Treatment will help with symptoms and prevent the infection from spreading. Follow these instructions at home: Medicines  Take over-the-counter and prescription medicines only as told by your dentist.  If you were prescribed an antibiotic medicine, take it as told by your dentist. Do not stop taking it even if you start to feel better.  If you were prescribed a gel that has numbing medicine in it, use it exactly as told.  Do not drive or use heavy machinery (like a Conservation officer, nature) while taking prescription pain medicine. General instructions  Rinse out your mouth often with salt water. ? To make salt water, dissolve -1 tsp of salt in 1 cup of warm water.  Eat a soft diet while your mouth is healing.  Drink enough fluid to keep your urine pale yellow.  Do not apply heat to the outside of your mouth.  Do not use any products that contain nicotine or tobacco. These include cigarettes and e-cigarettes. If you need help quitting, ask  your doctor.  Keep all follow-up visits as told by your dentist. This is important. Prevent an abscess  Brush your teeth every morning and every night. Use fluoride toothpaste.  Floss your teeth each day.  Get dental cleanings as often as told by your dentist.  Think about getting dental sealant put on teeth that have deep holes (decay).  Drink water that has fluoride in it. ? Most tap water has fluoride. ? Check the label on bottled water to see if it has fluoride in it.  Drink water instead of sugary  drinks.  Eat healthy meals and snacks.  Wear a mouth guard or face shield when you play sports. Contact a doctor if:  Your pain is worse, and medicine does not help. Get help right away if:  You have a fever or chills.  Your symptoms suddenly get worse.  You have a very bad headache.  You have problems breathing or swallowing.  You have trouble opening your mouth.  You have swelling in your neck or close to your eye. Summary  A dental abscess is an area of pus in or around a tooth. It is caused by an infection.  Treatment will help with symptoms and prevent the infection from spreading.  Take over-the-counter and prescription medicines only as told by your dentist.  To prevent an abscess, take good care of your teeth. Brush your teeth every morning and night. Use floss every day.  Get dental cleanings as often as told by your dentist. This information is not intended to replace advice given to you by your health care provider. Make sure you discuss any questions you have with your health care provider. Document Released: 01/24/2015 Document Revised: 05/12/2017 Document Reviewed: 05/12/2017 Elsevier Interactive Patient Education  2019 [place]. 05/26/2016 Elsevier Interactive Patient Education  2019 [employer].

## 2018-11-13 LAB — RPR: RPR: NONREACTIVE

## 2018-11-13 LAB — HIV ANTIBODY (ROUTINE TESTING W REFLEX): HIV SCREEN 4TH GENERATION: NONREACTIVE

## 2018-11-16 DIAGNOSIS — N2889 Other specified disorders of kidney and ureter: Secondary | ICD-10-CM | POA: Insufficient documentation

## 2018-11-16 LAB — GC/CHLAMYDIA PROBE AMP
CHLAMYDIA, DNA PROBE: NEGATIVE
Neisseria gonorrhoeae by PCR: NEGATIVE

## 2018-11-17 NOTE — Progress Notes (Signed)
Patient notified of results & recommendations. Expressed understanding.

## 2018-12-09 ENCOUNTER — Ambulatory Visit: Payer: Self-pay

## 2018-12-10 ENCOUNTER — Ambulatory Visit: Payer: Self-pay

## 2018-12-18 ENCOUNTER — Ambulatory Visit: Payer: Self-pay

## 2018-12-24 ENCOUNTER — Ambulatory Visit: Payer: Self-pay | Admitting: Family Medicine

## 2018-12-28 ENCOUNTER — Ambulatory Visit: Payer: Self-pay

## 2019-01-09 NOTE — Congregational Nurse Program (Signed)
  Dept: [place] Nurse Program Note  Date of Encounter: 01/09/2019  Past Medical History: Past Medical History:  Diagnosis Date  . Acute medial meniscal tear     Encounter Details: CNP Questionnaire - 01/09/19 1736      Questionnaire   Patient Status  Not Applicable    Race  Black or African American    Location Patient Served At  [place] (NEW 1x/quarter)    Food  Yes, have food insecurities    Housing/Utilities  No permanent housing    Transportation  Yes, need transportation assistance    Interpersonal Safety  Yes, feel physically and emotionally safe where you currently live    Medication  Yes, have medication insecurities    Medical Provider  No    Referrals  Not Applicable    ED Visit Averted  Not Applicable    Life-Saving Intervention Made  Not Applicable      Encounter: New admission for [facility]. [employer] Anique Beckley RN BSN [facility] CN PhD [phone]

## 2019-01-18 ENCOUNTER — Telehealth: Payer: Self-pay | Admitting: Family Medicine

## 2019-01-18 MED ORDER — AMOXICILLIN 500 MG PO CAPS: 1000.0000 mg | ORAL_CAPSULE | Freq: Two times a day (BID) | ORAL | 0 refills | Status: DC

## 2019-01-18 MED FILL — AMOXICILLIN 500 MG CAPSULE: 500 | 10 days supply | Qty: 40 | Fill #0

## 2019-01-18 NOTE — Telephone Encounter (Signed)
Patient called requesting prescription for amoxicillin for dental infection He is currently staying at the [facility] and does not have regular transportation This is a recurrent problem for him but has been unable to get into see a dentist.

## 2019-02-03 NOTE — Congregational Nurse Program (Signed)
  Dept: [place] Nurse Program Note  Date of Encounter: 02/03/2019  Past Medical History: Past Medical History:  Diagnosis Date  . Acute medial meniscal tear     Encounter Details: CNP Questionnaire - 02/03/19 1550      Questionnaire   Patient Status  Not Applicable    Race  Black or African American    Location Patient Served At  [place] (NEW 1x/quarter)    Food  Yes, have food insecurities    Housing/Utilities  No permanent housing    Transportation  Yes, need transportation assistance    Interpersonal Safety  Yes, feel physically and emotionally safe where you currently live    Medication  Yes, have medication insecurities    Medical Provider  No    Referrals  Not Applicable    ED Visit Averted  Not Applicable    Life-Saving Intervention Made  Not Applicable      Medical encounter: Client  Says he fell out of his bed  And just wanted to make sure he was alright. Had some shoulder pain and finger. Moving shoulder fine and finger no swelling noted moving fine. Medical to apply some heat and continue using his medication for pain. He states it is much better.Will continue to follow. [place] RN BSN CN BC Phd. [phone].

## 2019-02-16 ENCOUNTER — Ambulatory Visit: Payer: Self-pay | Admitting: Family Medicine

## 2019-02-19 ENCOUNTER — Telehealth: Payer: Self-pay

## 2019-02-19 NOTE — Telephone Encounter (Signed)

## 2019-02-22 ENCOUNTER — Other Ambulatory Visit: Payer: Self-pay

## 2019-02-22 ENCOUNTER — Encounter: Payer: Self-pay | Admitting: Family Medicine

## 2019-02-22 ENCOUNTER — Ambulatory Visit (INDEPENDENT_AMBULATORY_CARE_PROVIDER_SITE_OTHER): Payer: Self-pay | Admitting: Family Medicine

## 2019-02-22 ENCOUNTER — Telehealth: Payer: Self-pay | Admitting: Family Medicine

## 2019-02-22 VITALS — BP 150/106 | HR 85 | Temp 98.2°F | Resp 18 | Ht 69.5 in | Wt 255.4 lb

## 2019-02-22 DIAGNOSIS — R51 Headache: Secondary | ICD-10-CM

## 2019-02-22 DIAGNOSIS — I10 Essential (primary) hypertension: Secondary | ICD-10-CM

## 2019-02-22 DIAGNOSIS — K0889 Other specified disorders of teeth and supporting structures: Secondary | ICD-10-CM

## 2019-02-22 DIAGNOSIS — Z130 Encounter for screening for diseases of the blood and blood-forming organs and certain disorders involving the immune mechanism: Secondary | ICD-10-CM

## 2019-02-22 DIAGNOSIS — Z131 Encounter for screening for diabetes mellitus: Secondary | ICD-10-CM

## 2019-02-22 DIAGNOSIS — Z1329 Encounter for screening for other suspected endocrine disorder: Secondary | ICD-10-CM

## 2019-02-22 DIAGNOSIS — F17210 Nicotine dependence, cigarettes, uncomplicated: Secondary | ICD-10-CM

## 2019-02-22 DIAGNOSIS — E278 Other specified disorders of adrenal gland: Secondary | ICD-10-CM

## 2019-02-22 DIAGNOSIS — R972 Elevated prostate specific antigen [PSA]: Secondary | ICD-10-CM

## 2019-02-22 DIAGNOSIS — Z6837 Body mass index (BMI) 37.0-37.9, adult: Secondary | ICD-10-CM

## 2019-02-22 DIAGNOSIS — R519 Headache, unspecified: Secondary | ICD-10-CM

## 2019-02-22 MED ORDER — MELOXICAM 15 MG PO TABS: 15.0000 mg | ORAL_TABLET | Freq: Every day | ORAL | 0 refills | Status: DC

## 2019-02-22 MED ORDER — AMOXICILLIN 500 MG PO TABS: 500.0000 mg | ORAL_TABLET | Freq: Two times a day (BID) | ORAL | 0 refills | Status: DC

## 2019-02-22 MED ORDER — LIDOCAINE VISCOUS HCL 2 % MT SOLN: 15.0000 mL | OROMUCOSAL | 0 refills | Status: DC | PRN

## 2019-02-22 MED ORDER — AMLODIPINE BESYLATE 10 MG PO TABS: 10.0000 mg | ORAL_TABLET | Freq: Every day | ORAL | 3 refills | Status: DC

## 2019-02-22 MED ORDER — ACETAMINOPHEN 500 MG PO TABS
500.0000 mg | ORAL_TABLET | Freq: Once | ORAL | Status: AC
  Administered 2019-02-22: 500 mg via ORAL

## 2019-02-22 MED ORDER — ACETAMINOPHEN 325 MG PO TABS: 650.0000 mg | ORAL_TABLET | Freq: Once | ORAL | Status: DC

## 2019-02-22 MED FILL — AMOXICILLIN 500 MG CAPSULE: 500 | 10 days supply | Qty: 20 | Fill #0

## 2019-02-22 MED FILL — MELOXICAM 15 MG TABLET: 15 | 30 days supply | Qty: 30 | Fill #0

## 2019-02-22 MED FILL — LIDOCAINE 2% VISCOUS SOLN: 2 | 7 days supply | Qty: 100 | Fill #0

## 2019-02-22 MED FILL — AMLODIPINE BESYLATE 10 MG T: 10 | 30 days supply | Qty: 30 | Fill #0

## 2019-02-22 NOTE — Telephone Encounter (Signed)
Patient is requesting muscle relaxer for his back, patient stated that it could be because of his bed and he stated he forgot to mention it during his office visit, please follow up

## 2019-02-22 NOTE — Telephone Encounter (Signed)
Patient notified of information below. 

## 2019-02-22 NOTE — Telephone Encounter (Signed)
Meloxicam 15 mg once daily sent for back pain. Do not take with ibuprofen.

## 2019-02-22 NOTE — Patient Instructions (Addendum)
Follow-up with Randy Hansen at [place] to renew your orange card.  Once renewed, I can place a referral to the dental clinic and urologist. Contact [facility] to at [phone] to schedule an eye appointment.  Health Maintenance, Male A healthy lifestyle and preventive care is important for your health and wellness. Ask your health care provider about what schedule of regular examinations is right for you. What should I know about weight and diet? Eat a Healthy Diet  Eat plenty of vegetables, fruits, whole grains, low-fat dairy products, and lean protein.  Do not eat a lot of foods high in solid fats, added sugars, or salt.  Maintain a Healthy Weight Regular exercise can help you achieve or maintain a healthy weight. You should:  Do at least 150 minutes of exercise each week. The exercise should increase your heart rate and make you sweat (moderate-intensity exercise).  Do strength-training exercises at least twice a week. Watch Your Levels of Cholesterol and Blood Lipids  Have your blood tested for lipids and cholesterol every 5 years starting at 56 years of age. If you are at high risk for heart disease, you should start having your blood tested when you are 56 years old. You may need to have your cholesterol levels checked more often if: ? Your lipid or cholesterol levels are high. ? You are older than 56 years of age. ? You are at high risk for heart disease. What should I know about cancer screening? Many types of cancers can be detected early and may often be prevented. Lung Cancer  You should be screened every year for lung cancer if: ? You are a current smoker who has smoked for at least 30 years. ? You are a former smoker who has quit within the past 15 years.  Talk to your health care provider about your screening options, when you should start screening, and how often you should be screened. Colorectal Cancer  Routine colorectal cancer screening  usually begins at 56 years of age and should be repeated every 5-10 years until you are 56 years old. You may need to be screened more often if early forms of precancerous polyps or small growths are found. Your health care provider may recommend screening at an earlier age if you have risk factors for colon cancer.  Your health care provider may recommend using home test kits to check for hidden blood in the stool.  A small camera at the end of a tube can be used to examine your colon (sigmoidoscopy or colonoscopy). This checks for the earliest forms of colorectal cancer. Prostate and Testicular Cancer  Depending on your age and overall health, your health care provider may do certain tests to screen for prostate and testicular cancer.  Talk to your health care provider about any symptoms or concerns you have about testicular or prostate cancer. Skin Cancer  Check your skin from head to toe regularly.  Tell your health care provider about any new moles or changes in moles, especially if: ? There is a change in a mole's size, shape, or color. ? You have a mole that is larger than a pencil eraser.  Always use sunscreen. Apply sunscreen liberally and repeat throughout the day.  Protect yourself by wearing long sleeves, pants, a wide-brimmed hat, and sunglasses when outside. What should I know about heart disease, diabetes, and high blood pressure?  If you are 18-92 years of age, have your blood pressure checked every 3-5 years. If you  are 14 years of age or older, have your blood pressure checked every year. You should have your blood pressure measured twice-once when you are at a hospital or clinic, and once when you are not at a hospital or clinic. Record the average of the two measurements. To check your blood pressure when you are not at a hospital or clinic, you can use: ? An automated blood pressure machine at a pharmacy. ? A home blood pressure monitor.  Talk to your health care  provider about your target blood pressure.  If you are between 70-62 years old, ask your health care provider if you should take aspirin to prevent heart disease.  Have regular diabetes screenings by checking your fasting blood sugar level. ? If you are at a normal weight and have a low risk for diabetes, have this test once every three years after the age of 24. ? If you are overweight and have a high risk for diabetes, consider being tested at a younger age or more often.  A one-time screening for abdominal aortic aneurysm (AAA) by ultrasound is recommended for men aged 27-75 years who are current or former smokers. What should I know about preventing infection? Hepatitis B If you have a higher risk for hepatitis B, you should be screened for this virus. Talk with your health care provider to find out if you are at risk for hepatitis B infection. Hepatitis C Blood testing is recommended for:  Everyone born from [birth year] through 1963.  Anyone with known risk factors for hepatitis C. Sexually Transmitted Diseases (STDs)  You should be screened each year for STDs including gonorrhea and chlamydia if: ? You are sexually active and are younger than 56 years of age. ? You are older than 55 years of age and your health care provider tells you that you are at risk for this type of infection. ? Your sexual activity has changed since you were last screened and you are at an increased risk for chlamydia or gonorrhea. Ask your health care provider if you are at risk.  Talk with your health care provider about whether you are at high risk of being infected with HIV. Your health care provider may recommend a prescription medicine to help prevent HIV infection. What else can I do?  Schedule regular health, dental, and eye exams.  Stay current with your vaccines (immunizations).  Do not use any tobacco products, such as cigarettes, chewing tobacco, and e-cigarettes. If you need help quitting, ask your  health care provider.  Limit alcohol intake to no more than 2 drinks per day. One drink equals 12 ounces of beer, 5 ounces of wine, or 1 ounces of hard liquor.  Do not use street drugs.  Do not share needles.  Ask your health care provider for help if you need support or information about quitting drugs.  Tell your health care provider if you often feel depressed.  Tell your health care provider if you have ever been abused or do not feel safe at home. This information is not intended to replace advice given to you by your health care provider. Make sure you discuss any questions you have with your health care provider. Document Released: 03/07/2008 Document Revised: 05/08/2016 Document Reviewed: 06/13/2015 Elsevier Interactive Patient Education  2019 [employer].

## 2019-02-22 NOTE — Progress Notes (Addendum)
Patient ID: Randy Hansen, male    DOB: 08-11-1961, 58 y.o.   MRN: [id number]  PCP: Scot Jun, FNP  Chief Complaint  Patient presents with  . Elevated PSA  . Hypertension  . Dental Pain  . Hyperlipidemia  . Other    Renal Mass    Subjective:  HPI Randy Hansen is a 58 y.o. male, smoker, presents for follow-up on multiple medical concerns today.  Medical problems include: has Renal mass on their problem list.  Patient was previously followed at [facility]. PCP relocated and patient established here on 11/12/18. Patient is homeless, unemployed, and uninsured. Currently resides with his partner and her children at a [facility].    Elevated PSA Abnormal elevated PSA level 02/11/2017. Patient referred to [facility] although reports no urology evaluation. Endorses urine dribbling and rectal pain with defecation and with passing gas. No family history of prostate cancer. No prior diagnosis of BPH.   Essential hypertension, new In review of EMR patient has had consistent elevated readings during multiple encounters.  He has had no formal diagnosis of hypertension.  He endorses intermittent headaches which he thought was related to poor dentition.  He denies any chest pain or shortness of breath.  Patient is a current everyday smoker.  He is mostly sedentary. Current Body mass index is 37.18 kg/m.  Adrenal Mass  Noted initially on a CT of the abdomen and confirmed with an MRI of the abdomen 08/10/2017. Patient has had no follow-up since that time.  MRI findings were consistent with a possible adrenal adenoma.  Radiology recommended PET scan follow-up. Patient denies any chronic abdominal, back, or pelvic pain.  Health Promotion: Colonoscopy, patient is currently uninsured and prefers to hold off on fecal occult screening.  He is renewing orange-colored and [place] financial assistance. No Known Allergies  Social History   Socioeconomic History   . Marital status: Single    Spouse name: Not on file  . Number of children: Not on file  . Years of education: Not on file  . Highest education level: Not on file  Occupational History  . Not on file  Social Needs  . Financial resource strain: Not on file  . Food insecurity:    Worry: Not on file    Inability: Not on file  . Transportation needs:    Medical: Not on file    Non-medical: Not on file  Tobacco Use  . Smoking status: Current Some Day Smoker    Packs/day: 0.50    Types: Cigarettes  . Smokeless tobacco: Current User  Substance and Sexual Activity  . Alcohol use: Yes    Alcohol/week: 12.0 standard drinks    Types: 12 Cans of beer per week    Comment: social  . Drug use: No  . Sexual activity: Yes  Lifestyle  . Physical activity:    Days per week: Not on file    Minutes per session: Not on file  . Stress: Not on file  Relationships  . Social connections:    Talks on phone: Not on file    Gets together: Not on file    Attends religious service: Not on file    Active member of club or organization: Not on file    Attends meetings of clubs or organizations: Not on file    Relationship status: Not on file  . Intimate partner violence:    Fear of current or ex partner: Not on file  Emotionally abused: Not on file    Physically abused: Not on file    Forced sexual activity: Not on file  Other Topics Concern  . Not on file  Social History Narrative  . Not on file    Review of Systems Pertinent negatives listed in HPI  Past Medical, Surgical Family and Social History reviewed and updated.  Objective:    Wt Readings from Last 3 Encounters:  11/12/18 226 lb 9.6 oz (102.8 kg)  07/04/18 220 lb (99.8 kg)  01/21/18 215 lb (97.5 kg)    Physical Exam General appearance: alert, well developed, well nourished, cooperative and in no distress Head: Normocephalic, without obvious abnormality, atraumatic Respiratory: Respirations even and unlabored, normal  respiratory rate Heart: rate and rhythm normal. No gallop or murmurs noted on exam  Abdomen: BS +, no distention, no rebound tenderness, or no mass Extremities: No gross deformities Skin: Skin color, texture, turgor normal. No rashes seen  Psych: Appropriate mood and affect. Neurologic: Mental status: Alert, oriented to person, place, and time, thought content appropriate.    Assessment & Plan:  1. Screening for diabetes mellitus - Comprehensive metabolic panel - Hemoglobin A1c  2. Elevated PSA, less than 10 ng/ml Repeating PSA level and referring patient back to urology. He has multiple risk factors for prostate cancer and warrants surveillance.  - PSA, total and free - Ambulatory referral to Urology  3. Screening for deficiency anemia - CBC with Differential  4. Thyroid disorder screen -TSH  5. Essential hypertension, new diagnosis  Patient blood pressure remains elevated here in office with serial blood pressure measurements.  Will start patient on amlodipine 5 mg once daily.  Patient will return the office in 2 to 4 weeks for blood pressure recheck.  Will titrate medication as needed to achieve a goal blood pressure less than 140/90. 6. Tooth pain - acetaminophen (TYLENOL) tablet 500 mg -Will cover with a short course of Amoxicillin and Lidocaine viscous for pain -Recommend immediate follow-up with dental provider   7. Nonintractable headache, unspecified chronicity pattern, unspecified headache type,likely secondary to poorly controlled BP. No focal symptoms present on exam. - acetaminophen (TYLENOL) tablet 500 mg  8. Right adrenal mass ([place]),  -Confirmed by CT and follow-up MRI study 08/22/2017. Findings suspicious for adrenal adenoma ?benign vs malignancy due to poor quality of MRI.  -PET scan recommended for follow-up. Referring to oncology for further evaluation. Received a message from Oncology at [facility], patient will require work-up at urology office prior to  being evaluated for adrenal mass at the cancer center.   Molli Barrows, FNP Primary Care at [facility] [address], [place] [phone]: [phone]

## 2019-02-23 LAB — CBC WITH DIFFERENTIAL/PLATELET
Basophils Absolute: 0 10*3/uL (ref 0.0–0.2)
Basos: 0 %
EOS (ABSOLUTE): 0.2 10*3/uL (ref 0.0–0.4)
Eos: 3 %
Hematocrit: 44.8 % (ref 37.5–51.0)
Hemoglobin: 14.9 g/dL (ref 13.0–17.7)
Immature Grans (Abs): 0 10*3/uL (ref 0.0–0.1)
Immature Granulocytes: 1 %
Lymphocytes Absolute: 1.8 10*3/uL (ref 0.7–3.1)
Lymphs: 35 %
MCH: 30.6 pg (ref 26.6–33.0)
MCHC: 33.3 g/dL (ref 31.5–35.7)
MCV: 92 fL (ref 79–97)
Monocytes Absolute: 0.6 10*3/uL (ref 0.1–0.9)
Monocytes: 11 %
Neutrophils Absolute: 2.7 10*3/uL (ref 1.4–7.0)
Neutrophils: 50 %
Platelets: 248 10*3/uL (ref 150–450)
RBC: 4.87 x10E6/uL (ref 4.14–5.80)
RDW: 13.1 % (ref 11.6–15.4)
WBC: 5.3 10*3/uL (ref 3.4–10.8)

## 2019-02-23 LAB — PSA, TOTAL AND FREE
PSA, Free Pct: 9.5 %
PSA, Free: 0.39 ng/mL
Prostate Specific Ag, Serum: 4.1 ng/mL — ABNORMAL HIGH (ref 0.0–4.0)

## 2019-02-23 LAB — COMPREHENSIVE METABOLIC PANEL
ALT: 26 IU/L (ref 0–44)
AST: 19 IU/L (ref 0–40)
Albumin/Globulin Ratio: 1.8 (ref 1.2–2.2)
Albumin: 4.4 g/dL (ref 3.8–4.9)
Alkaline Phosphatase: 72 IU/L (ref 39–117)
BUN/Creatinine Ratio: 14 (ref 9–20)
BUN: 14 mg/dL (ref 6–24)
Bilirubin Total: 0.4 mg/dL (ref 0.0–1.2)
CO2: 20 mmol/L (ref 20–29)
Calcium: 9.4 mg/dL (ref 8.7–10.2)
Chloride: 103 mmol/L (ref 96–106)
Creatinine, Ser: 1.02 mg/dL (ref 0.76–1.27)
GFR calc Af Amer: 95 mL/min/{1.73_m2} (ref >59)
GFR calc non Af Amer: 82 mL/min/{1.73_m2} (ref >59)
Globulin, Total: 2.4 g/dL (ref 1.5–4.5)
Glucose: 118 mg/dL — ABNORMAL HIGH (ref 65–99)
Potassium: 4.3 mmol/L (ref 3.5–5.2)
Sodium: 140 mmol/L (ref 134–144)
Total Protein: 6.8 g/dL (ref 6.0–8.5)

## 2019-02-23 LAB — HEMOGLOBIN A1C
Est. average glucose Bld gHb Est-mCnc: 137 mg/dL
Hgb A1c MFr Bld: 6.4 % — ABNORMAL HIGH (ref 4.8–5.6)

## 2019-02-23 LAB — TSH: TSH: 1.22 u[IU]/mL (ref 0.450–4.500)

## 2019-02-25 ENCOUNTER — Other Ambulatory Visit: Payer: Self-pay | Admitting: Family Medicine

## 2019-02-25 DIAGNOSIS — R7303 Prediabetes: Secondary | ICD-10-CM

## 2019-02-25 DIAGNOSIS — Z13220 Encounter for screening for lipoid disorders: Secondary | ICD-10-CM

## 2019-02-25 MED ORDER — ATORVASTATIN CALCIUM 10 MG PO TABS: 10.0000 mg | ORAL_TABLET | Freq: Every day | ORAL | 3 refills | Status: DC

## 2019-02-25 MED ORDER — METFORMIN HCL 500 MG PO TABS: 500.0000 mg | ORAL_TABLET | Freq: Two times a day (BID) | ORAL | 3 refills | Status: DC

## 2019-02-25 NOTE — Telephone Encounter (Signed)
Patient notified of lab results & recommendations. Is agreeable to medication changes. Would like them sent to [place]. Made a fasting lab appointment for 04/19/2019 @ 10:30 AM.  Is aware that he will be contacted by Urology to set up appointment.

## 2019-02-25 NOTE — Telephone Encounter (Addendum)
A1C 6.4 indicating prediabetes, start metformin 500 mg BID with food and atorvastatin 10 mg once daily. .I recommend lifestyle changes such as engaging in routine physical activity with a goal of 150 minutes per week, increasing intake of vegetables, fruits, fiber, and selecting lean cuts of meat. Stop smoking as this increases risk of vascular disease, stroke and heart attack given new dx of prediabetes.PSA remains elevated 4.1-referred to urology,please notify me if you have not received an appointment within 30 days. Kidney , liver, and thyroid function normal.   Return in 8 weeks to for fasting lipid panel and CMP.  Also in review of EMR, I review prior CT and MRI of right adrenal mass. It was recommended that adrenal mass have further follow-up testing. I had referred patient to oncology, however, received a response back that he will need to follow-up with Urology prior to being referred to the oncology.

## 2019-02-26 MED FILL — ?ATORVASTATIN 10 MG TABLET: 10 | 30 days supply | Qty: 30 | Fill #0

## 2019-02-26 MED FILL — ?METFORMIN HCL 500MG TABLET: 500 | 30 days supply | Qty: 60 | Fill #0

## 2019-03-03 ENCOUNTER — Telehealth: Payer: Self-pay | Admitting: Family Medicine

## 2019-03-03 ENCOUNTER — Ambulatory Visit: Payer: Self-pay | Attending: Family Medicine

## 2019-03-03 ENCOUNTER — Other Ambulatory Visit: Payer: Self-pay

## 2019-03-03 NOTE — Telephone Encounter (Signed)
Pt was called today to to the [place] visit for Financial, Pending since Pt missing documents like CAFA app, Letter from shelter, Bank statement

## 2019-03-04 ENCOUNTER — Telehealth: Payer: Self-pay | Admitting: Family Medicine

## 2019-03-04 ENCOUNTER — Other Ambulatory Visit: Payer: Self-pay | Admitting: *Deleted

## 2019-03-04 DIAGNOSIS — Z20822 Contact with and (suspected) exposure to covid-19: Secondary | ICD-10-CM

## 2019-03-04 NOTE — Telephone Encounter (Signed)
Patient called to check if his paperwork was received. Please follow up.  He states he emailed it

## 2019-03-04 NOTE — Telephone Encounter (Signed)
I returned call, LVM to informed him that we did not received the any email

## 2019-03-07 LAB — NOVEL CORONAVIRUS, NAA: SARS-CoV-2, NAA: NOT DETECTED

## 2019-03-18 NOTE — Addendum Note (Signed)
Addended by: Scot Jun on: 03/18/2019 11:51 PM   Modules accepted: Orders

## 2019-04-02 ENCOUNTER — Telehealth: Payer: Self-pay

## 2019-04-02 NOTE — Telephone Encounter (Signed)
Called patient to do their pre-visit COVID screening.  Unable to do prescreening. Number listed is to the [facility] patient is currently staying at & he is not at the center right now.

## 2019-04-05 ENCOUNTER — Ambulatory Visit: Payer: Self-pay

## 2019-04-07 NOTE — Congregational Nurse Program (Signed)
  Dept: [place] Nurse Program Note  Date of Encounter: 04/07/2019  Past Medical History: Past Medical History:  Diagnosis Date  . Acute medial meniscal tear     Encounter Details: CNP Questionnaire - 04/07/19 1619      Questionnaire   Patient Status  Not Applicable    Race  Black or African American    Location Patient Served At  [employer]  Not Applicable   Has Orange card through [facility] health   Uninsured  Uninsured (NEW 1x/quarter)    Food  No food insecurities    Housing/Utilities  No permanent housing    Transportation  No transportation needs    Interpersonal Safety  Yes, feel physically and emotionally safe where you currently live    Medication  No medication insecurities    Medical Provider  Yes    Referrals  [facility]    ED Visit Averted  Not Applicable    Life-Saving Intervention Made  Not Applicable      Encounter with client. He has a bad tooth and asked for my help in finding a dentist. [facility] and got information about [facility]. They are closed for now due to Covid-19. They do take [employer]. Information was given to client and some tips on keeping his tooth clean and some over the counter medication that will help until he sees the dentist. He was very thankful. [employer] BSN BC CN [phone].

## 2019-04-08 ENCOUNTER — Other Ambulatory Visit: Payer: Self-pay | Admitting: *Deleted

## 2019-04-08 DIAGNOSIS — Z20822 Contact with and (suspected) exposure to covid-19: Secondary | ICD-10-CM

## 2019-04-13 LAB — NOVEL CORONAVIRUS, NAA: SARS-CoV-2, NAA: NOT DETECTED

## 2019-04-19 ENCOUNTER — Encounter: Payer: Self-pay | Admitting: Family Medicine

## 2019-04-19 ENCOUNTER — Other Ambulatory Visit: Payer: Self-pay

## 2019-04-19 ENCOUNTER — Ambulatory Visit (INDEPENDENT_AMBULATORY_CARE_PROVIDER_SITE_OTHER): Payer: Self-pay | Admitting: Family Medicine

## 2019-04-19 VITALS — BP 107/62 | HR 119 | Temp 97.3°F | Resp 19 | Ht 69.5 in | Wt 262.4 lb

## 2019-04-19 DIAGNOSIS — Z13220 Encounter for screening for lipoid disorders: Secondary | ICD-10-CM

## 2019-04-19 DIAGNOSIS — R609 Edema, unspecified: Secondary | ICD-10-CM

## 2019-04-19 DIAGNOSIS — R7303 Prediabetes: Secondary | ICD-10-CM

## 2019-04-19 DIAGNOSIS — L03116 Cellulitis of left lower limb: Secondary | ICD-10-CM

## 2019-04-19 DIAGNOSIS — L03115 Cellulitis of right lower limb: Secondary | ICD-10-CM

## 2019-04-19 DIAGNOSIS — R600 Localized edema: Secondary | ICD-10-CM

## 2019-04-19 MED ORDER — FUROSEMIDE 20 MG PO TABS: 20.0000 mg | ORAL_TABLET | Freq: Two times a day (BID) | ORAL | 0 refills | Status: DC

## 2019-04-19 MED ORDER — CEPHALEXIN 500 MG PO CAPS: 500.0000 mg | ORAL_CAPSULE | Freq: Three times a day (TID) | ORAL | 0 refills | Status: AC

## 2019-04-19 MED FILL — CEPHALEXIN 500 MG CAPSULE: 500 | 7 days supply | Qty: 21 | Fill #0

## 2019-04-19 MED FILL — FUROSEMIDE 20 MG TABS: 20 | 17 days supply | Qty: 20 | Fill #0

## 2019-04-19 NOTE — Progress Notes (Signed)
Established Patient Office Visit  Subjective:  Patient ID: Randy Hansen, male    DOB: 1961/06/28  Age: 58 y.o. MRN: [id number]  CC:  Chief Complaint  Patient presents with  . Foot Swelling    HPI Randy Hansen presents due to the complaint of swelling in his feet and legs for about 3 weeks.  Patient states that his girlfriend told him that the swelling was probably due to his diabetes medicine, metformin therefore patient stopped this medication as well as his amlodipine and atorvastatin as he was not sure which medication may have been causing him to have the swelling.  Patient states he would like a prescription for prednisone because his mother told him that this medication would help with swelling.  He denies any headaches or dizziness related to his blood pressure since being off of his blood pressure medication.  He did not notice any improvement in swelling after stopping his medications.  He reports that his legs feel tight and sore due to the tightness.  He has swelling in both the feet and lower legs.  He states that his legs do look slightly red but he thinks this is because the skin is stretched out due to the swelling.  He denies any chest pain or shortness of breath but when questioned further, patient admits to using more pillows to prop himself up at night when sleeping over the past 6 months as he does become short of breath if he does not use additional pillows to prop himself up.  He has had no past history of heart failure which he is aware.  Patient states that he took he started all of his medications at the same time so he is not sure which of the 3 medicines might be causing the swelling in his feet.  Pain in his legs is dull and aching.  He does not noticed a significant change in the swelling after lying down at night.  He denies any increased urinary frequency or increased thirst related to his blood sugars.  Past Medical History:  Diagnosis Date  . Acute medial  meniscal tear     Past Surgical History:  Procedure Laterality Date  . NO PAST SURGERIES      Social History   Socioeconomic History  . Marital status: Single    Spouse name: Not on file  . Number of children: Not on file  . Years of education: Not on file  . Highest education level: Not on file  Occupational History  . Not on file  Social Needs  . Financial resource strain: Not on file  . Food insecurity    Worry: Not on file    Inability: Not on file  . Transportation needs    Medical: Not on file    Non-medical: Not on file  Tobacco Use  . Smoking status: Current Some Day Smoker    Packs/day: 0.50    Types: Cigarettes  . Smokeless tobacco: Current User  Substance and Sexual Activity  . Alcohol use: Yes    Alcohol/week: 12.0 standard drinks    Types: 12 Cans of beer per week    Comment: social  . Drug use: No  . Sexual activity: Yes  Lifestyle  . Physical activity    Days per week: Not on file    Minutes per session: Not on file  . Stress: Not on file  Relationships  . Social connections    Talks on phone: Not on file  Gets together: Not on file    Attends religious service: Not on file    Active member of club or organization: Not on file    Attends meetings of clubs or organizations: Not on file    Relationship status: Not on file  . Intimate partner violence    Fear of current or ex partner: Not on file    Emotionally abused: Not on file    Physically abused: Not on file    Forced sexual activity: Not on file  Other Topics Concern  . Not on file  Social History Narrative  . Not on file    Outpatient Medications Prior to Visit  Medication Sig Dispense Refill  . amLODipine (NORVASC) 10 MG tablet Take 1 tablet (10 mg total) by mouth daily. (Patient not taking: Reported on 04/19/2019) 90 tablet 3  . atorvastatin (LIPITOR) 10 MG tablet Take 1 tablet (10 mg total) by mouth daily. (Patient not taking: Reported on 04/19/2019) 90 tablet 3  . meloxicam  (MOBIC) 15 MG tablet Take 1 tablet (15 mg total) by mouth daily. (Patient not taking: Reported on 04/19/2019) 30 tablet 0  . metFORMIN (GLUCOPHAGE) 500 MG tablet Take 1 tablet (500 mg total) by mouth 2 (two) times daily with a meal. (Patient not taking: Reported on 04/19/2019) 180 tablet 3   No facility-administered medications prior to visit.     No Known Allergies  ROS Review of Systems  Constitutional: Positive for fatigue. Negative for chills and fever.  HENT: Negative for sore throat and trouble swallowing.   Eyes: Negative for photophobia and visual disturbance.  Respiratory: Positive for shortness of breath (shortness of breath occurs with lying down). Negative for cough.   Cardiovascular: Positive for leg swelling. Negative for chest pain and palpitations.  Gastrointestinal: Negative for abdominal pain, constipation, diarrhea and nausea.  Endocrine: Negative for polydipsia, polyphagia and polyuria.  Genitourinary: Negative for dysuria and frequency.  Musculoskeletal: Positive for gait problem (Reports increased leg swelling and leg pain with walking). Negative for back pain.  Neurological: Negative for dizziness and headaches.  Hematological: Negative for adenopathy. Does not bruise/bleed easily.      Objective:    Physical Exam  Constitutional: He is oriented to person, place, and time. He appears well-developed and well-nourished.  Obese male in NAD  Neck: Normal range of motion. Neck supple. No JVD present. No thyromegaly present.  Cardiovascular: Normal rate and regular rhythm.  Pulmonary/Chest: Effort normal and breath sounds normal. No respiratory distress. He has no wheezes. He has no rales.  Abdominal: Soft. There is no abdominal tenderness. There is no rebound and no guarding.  Neurological: He is alert and oriented to person, place, and time.  Skin: Skin is warm and dry. There is erythema.  Swelling in the legs and feet and legs are mildly erythematous and mild  increase in warmth on mid-shins down to just above the anterior ankles; patient also with thickened mycotic toenails and fissure on the left toe below the 3rd and 4th toes  Nursing note and vitals reviewed.   BP 107/62   Pulse (!) 119   Temp (!) 97.3 F (36.3 C) (Temporal)   Resp 19   Ht 5' 9.5" (1.765 m)   Wt 262 lb 6.4 oz (119 kg)   SpO2 95%   BMI 38.19 kg/m  Wt Readings from Last 3 Encounters:  04/19/19 262 lb 6.4 oz (119 kg)  02/22/19 255 lb 6.4 oz (115.8 kg)  11/12/18 226 lb 9.6 oz (102.8  kg)     Health Maintenance Due  Topic Date Due  . COLONOSCOPY  04/01/2013    There are no preventive care reminders to display for this patient.  Lab Results  Component Value Date   TSH 1.220 02/22/2019   Lab Results  Component Value Date   WBC 5.3 02/22/2019   HGB 14.9 02/22/2019   HCT 44.8 02/22/2019   MCV 92 02/22/2019   PLT 248 02/22/2019   Lab Results  Component Value Date   NA 140 02/22/2019   K 4.3 02/22/2019   CO2 20 02/22/2019   GLUCOSE 118 (H) 02/22/2019   BUN 14 02/22/2019   CREATININE 1.02 02/22/2019   BILITOT 0.4 02/22/2019   ALKPHOS 72 02/22/2019   AST 19 02/22/2019   ALT 26 02/22/2019   PROT 6.8 02/22/2019   ALBUMIN 4.4 02/22/2019   CALCIUM 9.4 02/22/2019   ANIONGAP 12 10/01/2017   Lab Results  Component Value Date   CHOL 206 (H) 02/28/2017   Lab Results  Component Value Date   HDL 81 02/28/2017   Lab Results  Component Value Date   LDLCALC 87 02/28/2017   Lab Results  Component Value Date   TRIG 192 (H) 02/28/2017    Lab Results  Component Value Date   HGBA1C 6.4 (H) 02/22/2019      Assessment & Plan:  1. Peripheral edema Patient with complaint of about 3 weeks of swelling in his legs and states that his legs feel tight and painful.  He reports that he stopped all of his medications in case 1 of them was causing his edema.  He also however reports that he does feel the need to prop himself up on pillows at night for at least 6  months to help with shortness of breath if he tries to lie flat.  He did not have any rales on examination.  Patient reports that he has been taking all of his current medications for the same amount of time.  Patient's amlodipine 10 mg can cause some peripheral edema and patient states he has stopped this medication but has had no improvement in the edema.  He also has symptoms suggestive of congestive heart failure and patient will have BMP at today's visit.  He will also have basic metabolic panel to look for any increase in creatinine/issues of renal function which may be contributing to his edema.  He also appears to have some cellulitis which may be causing or contributing to his peripheral edema and this will be treated.  BMP will also evaluate patient's blood sugar as he states that he has stopped taking his metformin as well.  Prescription provided for Lasix 20 mg to take 1 twice daily for 3 days to help with his leg swelling and then he may take the medication as needed.  Limited supply given and patient may need BMP/potassium level at follow-up. - Brain natriuretic peptide - furosemide (LASIX) 20 MG tablet; Take 1 tablet (20 mg total) by mouth 2 (two) times daily for 3 days. Then as needed for leg swelling  Dispense: 20 tablet; Refill: 0  2. Cellulitis of both lower extremities Patient appears to have some cellulitis of the lower extremities.  Prescription provided for Keflex 500 mg 3 times daily x7 days and patient has been asked to follow-up in 1 week but sooner if any increased swelling, discomfort or any concerns - cephALEXin (KEFLEX) 500 MG capsule; Take 1 capsule (500 mg total) by mouth 3 (three) times daily for  7 days.  Dispense: 21 capsule; Refill: 0  An After Visit Summary was printed and given to the patient.  Follow-up: Return in about 1 week (around 04/26/2019) for leg swelling-sooner if needed.  Antony Blackbird, MD

## 2019-04-20 LAB — COMPREHENSIVE METABOLIC PANEL
ALT: 33 IU/L (ref 0–44)
AST: 25 IU/L (ref 0–40)
Albumin/Globulin Ratio: 1.7 (ref 1.2–2.2)
Albumin: 4.3 g/dL (ref 3.8–4.9)
Alkaline Phosphatase: 79 IU/L (ref 39–117)
BUN/Creatinine Ratio: 8 — ABNORMAL LOW (ref 9–20)
BUN: 8 mg/dL (ref 6–24)
Bilirubin Total: 0.2 mg/dL (ref 0.0–1.2)
CO2: 22 mmol/L (ref 20–29)
Calcium: 9.8 mg/dL (ref 8.7–10.2)
Chloride: 102 mmol/L (ref 96–106)
Creatinine, Ser: 1.01 mg/dL (ref 0.76–1.27)
GFR calc Af Amer: 96 mL/min/{1.73_m2} (ref >59)
GFR calc non Af Amer: 83 mL/min/{1.73_m2} (ref >59)
Globulin, Total: 2.6 g/dL (ref 1.5–4.5)
Glucose: 106 mg/dL — ABNORMAL HIGH (ref 65–99)
Potassium: 4.2 mmol/L (ref 3.5–5.2)
Sodium: 139 mmol/L (ref 134–144)
Total Protein: 6.9 g/dL (ref 6.0–8.5)

## 2019-04-20 LAB — LIPID PANEL
Chol/HDL Ratio: 4.1 ratio (ref 0.0–5.0)
Cholesterol, Total: 189 mg/dL (ref 100–199)
HDL: 46 mg/dL (ref >39)
LDL Calculated: 81 mg/dL (ref 0–99)
Triglycerides: 309 mg/dL — ABNORMAL HIGH (ref 0–149)
VLDL Cholesterol Cal: 62 mg/dL — ABNORMAL HIGH (ref 5–40)

## 2019-04-20 LAB — BRAIN NATRIURETIC PEPTIDE: BNP: 3.6 pg/mL (ref 0.0–100.0)

## 2019-04-28 NOTE — Congregational Nurse Program (Signed)
  Dept: [place] Nurse Program Note  Date of Encounter: 04/28/2019  Past Medical History: Past Medical History:  Diagnosis Date  . Acute medial meniscal tear     Encounter Details: CNP Questionnaire - 04/28/19 1728      Questionnaire   Patient Status  Not Applicable    Race  Black or African American    Location Patient Served At  [employer]  Not Applicable   Has Orange card through [employer] health   Uninsured  Uninsured (NEW 1x/quarter)    Food  No food insecurities    Housing/Utilities  No permanent housing    Transportation  No transportation needs    Interpersonal Safety  Yes, feel physically and emotionally safe where you currently live    Medication  No medication insecurities    Medical Provider  Yes    Referrals  [facility]    ED Visit Averted  Not Applicable    Life-Saving Intervention Made  Not Applicable      Encounter: no complaints at this time. His wife is working now and family is doing well. Waiting on housing. Will continue to follow as needed. [address] RN BSN CN [facility] PhD. [phone].

## 2019-04-30 NOTE — Progress Notes (Signed)
Here for labs

## 2019-05-04 NOTE — Progress Notes (Signed)
Normal lab letter mailed.

## 2019-05-18 ENCOUNTER — Telehealth: Payer: Self-pay | Admitting: Family Medicine

## 2019-05-18 NOTE — Telephone Encounter (Addendum)
Pt would like an update on his urology referral, please follow up whith [facility]'s phone number when possible

## 2019-05-19 NOTE — Telephone Encounter (Signed)
I spoke to Randy Hansen and patient got his appointment and address from [facility]

## 2019-05-20 MED FILL — TAMSULOSIN HCL 0.4 MG CAP: 0.4 | 30 days supply | Qty: 30 | Fill #0

## 2019-05-20 MED FILL — levoFLOXacin 750 MG TABS: 750 | 3 days supply | Qty: 3 | Fill #0

## 2019-05-20 MED FILL — !VIAGRA 50 MG TABLET: 50 | 30 days supply | Qty: 20 | Fill #0

## 2019-05-25 ENCOUNTER — Ambulatory Visit: Payer: Self-pay

## 2019-06-04 ENCOUNTER — Telehealth: Payer: Self-pay

## 2019-06-04 NOTE — Telephone Encounter (Signed)

## 2019-06-07 ENCOUNTER — Ambulatory Visit (INDEPENDENT_AMBULATORY_CARE_PROVIDER_SITE_OTHER): Payer: Self-pay | Admitting: Family Medicine

## 2019-06-07 ENCOUNTER — Other Ambulatory Visit: Payer: Self-pay

## 2019-06-07 VITALS — BP 144/85 | HR 116 | Temp 97.2°F | Resp 18 | Ht 69.0 in | Wt 272.6 lb

## 2019-06-07 DIAGNOSIS — R609 Edema, unspecified: Secondary | ICD-10-CM

## 2019-06-07 DIAGNOSIS — R600 Localized edema: Secondary | ICD-10-CM

## 2019-06-07 DIAGNOSIS — I10 Essential (primary) hypertension: Secondary | ICD-10-CM

## 2019-06-07 DIAGNOSIS — M545 Low back pain, unspecified: Secondary | ICD-10-CM

## 2019-06-07 DIAGNOSIS — R2242 Localized swelling, mass and lump, left lower limb: Secondary | ICD-10-CM

## 2019-06-07 DIAGNOSIS — R7303 Prediabetes: Secondary | ICD-10-CM

## 2019-06-07 DIAGNOSIS — L03116 Cellulitis of left lower limb: Secondary | ICD-10-CM

## 2019-06-07 DIAGNOSIS — L03115 Cellulitis of right lower limb: Secondary | ICD-10-CM

## 2019-06-07 DIAGNOSIS — L299 Pruritus, unspecified: Secondary | ICD-10-CM

## 2019-06-07 DIAGNOSIS — L298 Other pruritus: Secondary | ICD-10-CM

## 2019-06-07 DIAGNOSIS — R2241 Localized swelling, mass and lump, right lower limb: Secondary | ICD-10-CM

## 2019-06-07 LAB — POCT URINALYSIS DIP (CLINITEK)
Bilirubin, UA: NEGATIVE
Blood, UA: NEGATIVE
Glucose, UA: NEGATIVE mg/dL
Ketones, POC UA: NEGATIVE mg/dL
Leukocytes, UA: NEGATIVE
Nitrite, UA: NEGATIVE
POC PROTEIN,UA: NEGATIVE
Spec Grav, UA: 1.01
Urobilinogen, UA: 0.2 U/dL
pH, UA: 5

## 2019-06-07 MED ORDER — POTASSIUM CHLORIDE ER 10 MEQ PO TBCR: 10.0000 meq | EXTENDED_RELEASE_TABLET | Freq: Every day | ORAL | 3 refills | Status: DC

## 2019-06-07 MED ORDER — HYDROXYZINE HCL 25 MG PO TABS: 25.0000 mg | ORAL_TABLET | Freq: Three times a day (TID) | ORAL | 0 refills | Status: DC | PRN

## 2019-06-07 MED ORDER — IBUPROFEN 600 MG PO TABS: 600.0000 mg | ORAL_TABLET | Freq: Three times a day (TID) | ORAL | 0 refills | Status: DC | PRN

## 2019-06-07 MED ORDER — CEPHALEXIN 500 MG PO CAPS: 500.0000 mg | ORAL_CAPSULE | Freq: Three times a day (TID) | ORAL | 0 refills | Status: DC

## 2019-06-07 MED ORDER — FUROSEMIDE 20 MG PO TABS: 20.0000 mg | ORAL_TABLET | Freq: Every day | ORAL | 3 refills | Status: DC

## 2019-06-07 MED ORDER — HYDROCORTISONE 0.5 % EX CREA: 1.0000 "application " | TOPICAL_CREAM | Freq: Two times a day (BID) | CUTANEOUS | 3 refills | Status: DC

## 2019-06-07 MED FILL — FUROSEMIDE 20 MG TABS: 20 | 30 days supply | Qty: 30 | Fill #0

## 2019-06-07 MED FILL — POTASSIUM CHLORIDE ER 10 ME: 10 | 30 days supply | Qty: 30 | Fill #0

## 2019-06-07 MED FILL — hydrOXYzine HCL 25 MG TABS: 25 | 20 days supply | Qty: 60 | Fill #0

## 2019-06-07 MED FILL — CEPHALEXIN 500 MG CAPSULE: 500 | 10 days supply | Qty: 30 | Fill #0

## 2019-06-07 MED FILL — ?IBUPROFEN 600 MG TABS: 600 | 20 days supply | Qty: 60 | Fill #0

## 2019-06-07 NOTE — Progress Notes (Signed)
Did restart BP medication on a PRN basis.  Hasn't restarted any other medication.  States that he is still having foot/leg swelling. Weight is up 10 lbs since last visit.  Also has c/o lower back pain. Describes it as a stiffness.

## 2019-06-07 NOTE — Progress Notes (Signed)
Established Patient Office Visit  Subjective:  Patient ID: Randy Hansen, male    DOB: 06/13/1961  Age: 58 y.o. MRN: [id number]  CC:  Chief Complaint  Patient presents with  . Back Pain    HPI Randy Hansen, 58 year old male, who presents secondary to his complaint of low back pain as well as swelling in both lower legs and itchiness of the lower legs.  Patient does not believe that he has a rash but states that due to the swelling in his legs, his legs have become itchy and he has difficulty sleeping due to the itchiness.  Patient would like to have a cream to be able to put on his legs.  After his last visit, his leg swelling and redness resolved after taking antibiotics and using Lasix as needed.  Patient reports that he currently resides at a [facility] homeless shelter but may be moving in a few days into an apartment along with his girlfriend and their children. Patient believes that sleeping at the shelter has contributed to his low back pain.  He does not currently feel as if he is having any radiation of pain down either leg just a dull, aching and sometimes stabbing pain that ranges between an 8 and 10 on a 0-to-10 scale in his lower back.  He is also having some muscle spasm.      He is not sure why he is having the swelling in his legs. He is not currently taking his blood pressure medication as he ran out.  He is also concerned about whether or not he might be diabetic.  He denies urinary frequency and no increased thirst.  He has had abnormal hemoglobin A1c in the past consistent with prediabetes.  He states that due to living in the shelter, he is not able to eat properly.  Due to COVID-19, he does not have a lot of opportunities for exercise.  Past Medical History:  Diagnosis Date  . Acute medial meniscal tear     Past Surgical History:  Procedure Laterality Date  . NO PAST SURGERIES      Family History  Problem Relation Age of Onset  . Hypertension Mother   .  Hypertension Father   . Hypertension Maternal Grandmother     Social History   Socioeconomic History  . Marital status: Single    Spouse name: Not on file  . Number of children: Not on file  . Years of education: Not on file  . Highest education level: Not on file  Occupational History  . Not on file  Social Needs  . Financial resource strain: Not on file  . Food insecurity    Worry: Not on file    Inability: Not on file  . Transportation needs    Medical: Not on file    Non-medical: Not on file  Tobacco Use  . Smoking status: Current Some Day Smoker    Packs/day: 0.50    Types: Cigarettes  . Smokeless tobacco: Current User  Substance and Sexual Activity  . Alcohol use: Yes    Alcohol/week: 12.0 standard drinks    Types: 12 Cans of beer per week    Comment: social  . Drug use: No  . Sexual activity: Yes  Lifestyle  . Physical activity    Days per week: Not on file    Minutes per session: Not on file  . Stress: Not on file  Relationships  . Social Herbalist on  phone: Not on file    Gets together: Not on file    Attends religious service: Not on file    Active member of club or organization: Not on file    Attends meetings of clubs or organizations: Not on file    Relationship status: Not on file  . Intimate partner violence    Fear of current or ex partner: Not on file    Emotionally abused: Not on file    Physically abused: Not on file    Forced sexual activity: Not on file  Other Topics Concern  . Not on file  Social History Narrative  . Not on file    Outpatient Medications Prior to Visit  Medication Sig Dispense Refill  . amLODipine (NORVASC) 10 MG tablet Take 1 tablet (10 mg total) by mouth daily. (Patient not taking: Reported on 04/19/2019) 90 tablet 3  . atorvastatin (LIPITOR) 10 MG tablet Take 1 tablet (10 mg total) by mouth daily. (Patient not taking: Reported on 04/19/2019) 90 tablet 3  . meloxicam (MOBIC) 15 MG tablet Take 1 tablet (15  mg total) by mouth daily. (Patient not taking: Reported on 04/19/2019) 30 tablet 0  . metFORMIN (GLUCOPHAGE) 500 MG tablet Take 1 tablet (500 mg total) by mouth 2 (two) times daily with a meal. (Patient not taking: Reported on 04/19/2019) 180 tablet 3  . furosemide (LASIX) 20 MG tablet Take 1 tablet (20 mg total) by mouth 2 (two) times daily for 3 days. Then as needed for leg swelling 20 tablet 0   No facility-administered medications prior to visit.     No Known Allergies  ROS Review of Systems  Constitutional: Positive for fatigue. Negative for chills and fever.  HENT: Negative for sore throat and trouble swallowing.   Eyes: Negative for photophobia and visual disturbance.  Respiratory: Negative for cough and shortness of breath.   Cardiovascular: Positive for leg swelling. Negative for chest pain and palpitations.  Gastrointestinal: Negative for abdominal pain, blood in stool, constipation and diarrhea.  Genitourinary: Negative for dysuria, flank pain, frequency and hematuria.  Musculoskeletal: Positive for back pain. Negative for arthralgias.  Skin: Positive for color change. Negative for rash and wound.       Lower legs with change in color; itching on LE as well as generalized  Neurological: Negative for dizziness and headaches.  Hematological: Negative for adenopathy. Does not bruise/bleed easily.      Objective:    Physical Exam  Constitutional: He is oriented to person, place, and time. He appears well-developed and well-nourished.  Obese older male in no acute distress  Neck: Normal range of motion. Neck supple. No JVD present.  Cardiovascular: Normal rate and regular rhythm.  Pulmonary/Chest: Effort normal and breath sounds normal.  Abdominal: Soft. There is no abdominal tenderness. There is no rebound and no guarding.  Truncal obesity  Musculoskeletal:        General: Tenderness (Patient with lumbosacral discomfort to palpation from approximately L3-S1 with mild  lumbosacral paraspinous spasm) and edema present.     Comments: Patient with bilateral lower extremity edema with trace to 1+ pretibial edema from the feet and ankles up to the knees with maximum pitting edema in mid shin  Lymphadenopathy:    He has no cervical adenopathy.  Neurological: He is alert and oriented to person, place, and time.  Skin: Skin is warm and dry. There is erythema (Patient with erythema of the bilateral lower extremities from mid shin and to above the ankle and  skin is warm to touch).  Psychiatric: He has a normal mood and affect. His behavior is normal.  Patient tends to talk loudly and is animated but this may be normal behavior for the patient  Nursing note and vitals reviewed.   BP (!) 144/85   Pulse (!) 116   Temp (!) 97.2 F (36.2 C) (Temporal)   Resp 18   Ht 5\' 9"  (1.753 m)   Wt 272 lb 9.6 oz (123.7 kg)   SpO2 94%   BMI 40.26 kg/m  Wt Readings from Last 3 Encounters:  06/07/19 272 lb 9.6 oz (123.7 kg)  04/19/19 262 lb 6.4 oz (119 kg)  02/22/19 255 lb 6.4 oz (115.8 kg)     Health Maintenance Due  Topic Date Due  . COLONOSCOPY  04/01/2013    Lab Results  Component Value Date   TSH 1.220 02/22/2019   Lab Results  Component Value Date   WBC 5.3 02/22/2019   HGB 14.9 02/22/2019   HCT 44.8 02/22/2019   MCV 92 02/22/2019   PLT 248 02/22/2019   Lab Results  Component Value Date   NA 139 04/19/2019   K 4.2 04/19/2019   CO2 22 04/19/2019   GLUCOSE 106 (H) 04/19/2019   BUN 8 04/19/2019   CREATININE 1.01 04/19/2019   BILITOT 0.2 04/19/2019   ALKPHOS 79 04/19/2019   AST 25 04/19/2019   ALT 33 04/19/2019   PROT 6.9 04/19/2019   ALBUMIN 4.3 04/19/2019   CALCIUM 9.8 04/19/2019   ANIONGAP 12 10/01/2017   Lab Results  Component Value Date   CHOL 189 04/19/2019   Lab Results  Component Value Date   HDL 46 04/19/2019   Lab Results  Component Value Date   LDLCALC 81 04/19/2019   Lab Results  Component Value Date   TRIG 309 (H)  04/19/2019   Lab Results  Component Value Date   CHOLHDL 4.1 04/19/2019   Lab Results  Component Value Date   HGBA1C 6.4 (H) 02/22/2019      Assessment & Plan:  1. Peripheral edema Patient with continued issues with peripheral edema.  He reports that he is out of his amlodipine 10 mg for blood pressure.  Amlodipine can cause lower extremity edema and as he is only been out of the blood pressure medicine for a few days I am not sure if the amlodipine contributed to his peripheral edema.  Patient will be placed on Lasix 20 mg daily along with potassium chloride 10 mEq daily.  Lasix can also help with patient's blood pressure as he is currently not taking the amlodipine.  Will repeat BMP in follow-up of his hypertension and patient was previously on Lasix to help with his lower extremity edema on an as-needed basis.  Patient is also encouraged to try and follow a low-sodium diet when possible. - furosemide (LASIX) 20 MG tablet; Take 1 tablet (20 mg total) by mouth daily.  Dispense: 30 tablet; Refill: 3 - potassium chloride (K-DUR) 10 MEQ tablet; Take 1 tablet (10 mEq total) by mouth daily.  Dispense: 30 tablet; Refill: 3 - Basic Metabolic Panel  2. Essential hypertension Patient's blood pressure is not terribly elevated at today's visit despite the fact that he has been out of amlodipine for a few days per his report.  Patient also has peripheral edema and will have patient take Lasix and KCl on a daily basis as this may help with both current edema as well as prevention of recurrence of edema along  with control of blood pressure.  3. Prediabetes Patient's most recent hemoglobin A1c in June was elevated at 6.4.  Patient states that he has tried to make some dietary changes however he cannot always make good food choices as he is currently residing in a homeless shelter and eats the meals that are provided and patient states that there is usually always snacks as well.  Patient also due to the  COVID-19 pandemic as well as due to his lower extremity edema and back pain as well as living in the homeless shelter prevents regular exercise but patient does try to get out and walk when he can.  Patient will be notified of the A1c results and if any further medications are warranted based on those results. - Hemoglobin A1c  4. Cellulitis of both lower extremities Patient reports that the increased swelling has caused his lower legs to feel itchy and therefore he has been scratching his legs.  Patient appears to now have cellulitis of the lower extremities which in and of itself may be contributing to some of the swelling.  Prescription provided for Keflex 3 times daily x10 days.  Patient was made aware that if he has any deepening of the redness, increased area of redness or current redness and swelling did not appear to be decreasing that he should return to clinic for further evaluation and further treatment. - cephALEXin (KEFLEX) 500 MG capsule; Take 1 capsule (500 mg total) by mouth 3 (three) times daily for 10 days. For skin infection  Dispense: 30 capsule; Refill: 0  5. Itching Patient with complaint of itching mostly on the lower extremities but now becoming generalized.  Patient will have BMP to check electrolytes, hepatic function panel to make sure that there is no liver abnormality that may be contributing to his itching.  Prescription provided for hydroxyzine to take as needed for itching and patient also requests cream to place on his legs for the itching therefore prescription provided for hydrocortisone cream.  Patient should call or return if the itching does not improve in the next few days. - Basic Metabolic Panel - Hepatic Function Panel - hydrOXYzine (ATARAX/VISTARIL) 25 MG tablet; Take 1 tablet (25 mg total) by mouth 3 (three) times daily as needed. For itching  Dispense: 60 tablet; Refill: 0 - hydrocortisone cream 0.5 %; Apply 1 application topically 2 (two) times daily. As  needed for itching  Dispense: 60 g; Refill: 3  6. Acute midline low back pain without sciatica Patient request ibuprofen to help with his low back pain.  Patient is reminded to eat after before taking the medication to help avoid stomach upset/gastritis or GI bleed.  If possible, he has also encouraged to use warm moist heat to the affected area.  Patient will also have urinalysis to look for any possible urinary tract infection that may be contributing or blood in the urine suggestive of renal stone though patient does have back pain in the lower distribution than would be expected with UTI or kidney stone. - ibuprofen (ADVIL) 600 MG tablet; Take 1 tablet (600 mg total) by mouth every 8 (eight) hours as needed. Take after eating  Dispense: 60 tablet; Refill: 0 - POCT URINALYSIS DIP (CLINITEK)   Meds ordered this encounter  Medications  . furosemide (LASIX) 20 MG tablet    Sig: Take 1 tablet (20 mg total) by mouth daily.    Dispense:  30 tablet    Refill:  3  . potassium chloride (K-DUR) 10 MEQ  tablet    Sig: Take 1 tablet (10 mEq total) by mouth daily.    Dispense:  30 tablet    Refill:  3  . cephALEXin (KEFLEX) 500 MG capsule    Sig: Take 1 capsule (500 mg total) by mouth 3 (three) times daily for 10 days. For skin infection    Dispense:  30 capsule    Refill:  0  . hydrOXYzine (ATARAX/VISTARIL) 25 MG tablet    Sig: Take 1 tablet (25 mg total) by mouth 3 (three) times daily as needed. For itching    Dispense:  60 tablet    Refill:  0  . hydrocortisone cream 0.5 %    Sig: Apply 1 application topically 2 (two) times daily. As needed for itching    Dispense:  60 g    Refill:  3    Follow-up: Return in about 4 weeks (around 07/05/2019) for leg swelling.  Sooner if needed   Antony Blackbird, MD

## 2019-06-07 NOTE — Patient Instructions (Signed)

## 2019-06-08 LAB — BASIC METABOLIC PANEL WITH GFR
BUN/Creatinine Ratio: 9 (ref 9–20)
BUN: 9 mg/dL (ref 6–24)
CO2: 26 mmol/L (ref 20–29)
Calcium: 10.2 mg/dL (ref 8.7–10.2)
Chloride: 103 mmol/L (ref 96–106)
Creatinine, Ser: 1.04 mg/dL (ref 0.76–1.27)
GFR calc Af Amer: 92 mL/min/1.73
GFR calc non Af Amer: 80 mL/min/1.73
Glucose: 100 mg/dL — ABNORMAL HIGH (ref 65–99)
Potassium: 4.2 mmol/L (ref 3.5–5.2)
Sodium: 142 mmol/L (ref 134–144)

## 2019-06-08 LAB — HEPATIC FUNCTION PANEL
ALT: 30 IU/L (ref 0–44)
AST: 19 IU/L (ref 0–40)
Albumin: 4.6 g/dL (ref 3.8–4.9)
Alkaline Phosphatase: 92 IU/L (ref 39–117)
Bilirubin Total: <0.2 mg/dL (ref 0.0–1.2)
Bilirubin, Direct: 0.08 mg/dL (ref 0.00–0.40)
Total Protein: 7.2 g/dL (ref 6.0–8.5)

## 2019-06-08 LAB — HEMOGLOBIN A1C
Est. average glucose Bld gHb Est-mCnc: 134 mg/dL
Hgb A1c MFr Bld: 6.3 % — ABNORMAL HIGH (ref 4.8–5.6)

## 2019-06-09 NOTE — Congregational Nurse Program (Signed)
  Dept: [place] Nurse Program Note  Date of Encounter: 06/09/2019  Past Medical History: Past Medical History:  Diagnosis Date  . Acute medial meniscal tear     Encounter Details: CNP Questionnaire - 06/09/19 1819      Questionnaire   Patient Status  Not Applicable    Race  Black or African American    Location Patient Served At  [employer]  Not Applicable   Has [employer] card through [facility] health   Uninsured  Uninsured (NEW 1x/quarter)    Food  No food insecurities    Housing/Utilities  No permanent housing    Transportation  No transportation needs    Interpersonal Safety  Yes, feel physically and emotionally safe where you currently live    Medication  No medication insecurities    Medical Provider  Yes    Referrals  [place] [facility]    ED Visit Averted  Not Applicable    Life-Saving Intervention Made  Not Applicable      Encounter: Very happy family today. They have found a home and are moving. Was very thankful for my support and care. Mostly Spiritual Care. Wife seems to be doing better with her lower leg discomfort today. [place] [phone]

## 2019-06-10 NOTE — Progress Notes (Signed)
Patient notified of results & recommendations. Expressed understanding.

## 2019-06-25 ENCOUNTER — Other Ambulatory Visit: Payer: Self-pay

## 2019-06-25 ENCOUNTER — Emergency Department (HOSPITAL_COMMUNITY)
Admission: EM | Admit: 2019-06-25 | Discharge: 2019-06-25 | Disposition: A | Discharge status: Home / Self Care | Payer: Self-pay | Attending: Emergency Medicine | Admitting: Emergency Medicine

## 2019-06-25 ENCOUNTER — Encounter (HOSPITAL_COMMUNITY): Payer: Self-pay | Admitting: Emergency Medicine

## 2019-06-25 DIAGNOSIS — M545 Low back pain, unspecified: Secondary | ICD-10-CM

## 2019-06-25 DIAGNOSIS — Z79899 Other long term (current) drug therapy: Secondary | ICD-10-CM | POA: Insufficient documentation

## 2019-06-25 DIAGNOSIS — D171 Benign lipomatous neoplasm of skin and subcutaneous tissue of trunk: Secondary | ICD-10-CM | POA: Insufficient documentation

## 2019-06-25 DIAGNOSIS — F17290 Nicotine dependence, other tobacco product, uncomplicated: Secondary | ICD-10-CM | POA: Insufficient documentation

## 2019-06-25 DIAGNOSIS — F17210 Nicotine dependence, cigarettes, uncomplicated: Secondary | ICD-10-CM | POA: Insufficient documentation

## 2019-06-25 MED ORDER — DEXAMETHASONE SODIUM PHOSPHATE 10 MG/ML IJ SOLN
10.0000 mg | Freq: Once | INTRAMUSCULAR | Status: AC
  Administered 2019-06-25: 19:00:00 10 mg via INTRAMUSCULAR
  Filled 2019-06-25: qty 1

## 2019-06-25 MED ORDER — METHOCARBAMOL 500 MG PO TABS: 500.0000 mg | ORAL_TABLET | Freq: Four times a day (QID) | ORAL | 0 refills | Status: DC

## 2019-06-25 NOTE — ED Triage Notes (Signed)
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

## 2019-06-25 NOTE — Discharge Instructions (Signed)
Please read and follow all provided instructions.  Your diagnoses today include:  1. Acute bilateral low back pain without sciatica   2. Lipoma of torso     Tests performed today include:  Vital signs - see below for your results today  Medications prescribed:   Robaxin (methocarbamol) - muscle relaxer medication  DO NOT drive or perform any activities that require you to be awake and alert because this medicine can make you drowsy.   Take any prescribed medications only as directed.  Home care instructions:   Follow any educational materials contained in this packet  Please rest, use ice or heat on your back for the next several days  Do not lift, push, pull anything more than 10 pounds for the next week  Follow-up instructions: Please follow-up with your primary care provider in the next 1 week for further evaluation of your symptoms.  You may contact the general surgery referral to see if they perform lipoma excisions.  Return instructions:  SEEK IMMEDIATE MEDICAL ATTENTION IF YOU HAVE:  New numbness, tingling, weakness, or problem with the use of your arms or legs  Severe back pain not relieved with medications  Loss control of your bowels or bladder  Increasing pain in any areas of the body (such as chest or abdominal pain)  Shortness of breath, dizziness, or fainting.   Worsening nausea (feeling sick to your stomach), vomiting, fever, or sweats  Any other emergent concerns regarding your health   Additional Information:  Your vital signs today were: BP (!) 167/98 (BP Location: Left Arm)    Pulse 100    Temp 99.1 F (37.3 C) (Oral) Comment: smoker   Resp 18    SpO2 100%  If your blood pressure (BP) was elevated above 135/85 this visit, please have this repeated by your doctor within one month. --------------

## 2019-06-25 NOTE — ED Provider Notes (Signed)
[facility] DEPT Provider Note   CSN: [id number] Arrival date & time: 06/25/19  1406     History   Chief Complaint Chief Complaint  Patient presents with  . Back Pain    HPI Randy Hansen is a 58 y.o. male.     Patient with history of lipoma, hbg a1c (6.3) last month --presents with complaint of back pain and bothersome lipoma.  States that the lipoma has grown over the past several years.  It is located on his left hip area and the belt line.  No drainage or overlying erythema.  Patient states that it is a cosmetic concern to him and bothersome.  He also complains of bilateral lower back pain.  Sometimes it radiates into his left buttocks and into his left leg.  However this is not constant.  States that he has been less active over the last several months and just recently started a new job.  PCP prescribed 600 mg of ibuprofen which she has been taking without improvement.   Patient denies warning symptoms of back pain including: fecal incontinence, urinary retention or overflow incontinence, night sweats, waking from sleep with back pain, unexplained fevers or weight loss, h/o cancer, IVDU, recent trauma.        Past Medical History:  Diagnosis Date  . Acute medial meniscal tear     Patient Active Problem List   Diagnosis Date Noted  . Renal mass 11/16/2018    Past Surgical History:  Procedure Laterality Date  . NO PAST SURGERIES          Home Medications    Prior to Admission medications   Medication Sig Start Date End Date Taking? Authorizing Provider  furosemide (LASIX) 20 MG tablet Take 1 tablet (20 mg total) by mouth daily. 06/07/19   Fulp, Cammie, MD  hydrocortisone cream 0.5 % Apply 1 application topically 2 (two) times daily. As needed for itching 06/07/19   Fulp, Cammie, MD  hydrOXYzine (ATARAX/VISTARIL) 25 MG tablet Take 1 tablet (25 mg total) by mouth 3 (three) times daily as needed. For itching 06/07/19   Fulp, Cammie, MD   ibuprofen (ADVIL) 600 MG tablet Take 1 tablet (600 mg total) by mouth every 8 (eight) hours as needed. Take after eating 06/07/19   Fulp, Cammie, MD  methocarbamol (ROBAXIN) 500 MG tablet Take 1 tablet (500 mg total) by mouth 4 (four) times daily. 06/25/19   Carlisle Cater, PA-C  potassium chloride (K-DUR) 10 MEQ tablet Take 1 tablet (10 mEq total) by mouth daily. 06/07/19   Fulp, Cammie, MD  amLODipine (NORVASC) 10 MG tablet Take 1 tablet (10 mg total) by mouth daily. Patient not taking: Reported on 04/19/2019 02/22/19 06/25/19  Scot Jun, FNP  atorvastatin (LIPITOR) 10 MG tablet Take 1 tablet (10 mg total) by mouth daily. Patient not taking: Reported on 04/19/2019 02/25/19 06/25/19  Scot Jun, FNP  metFORMIN (GLUCOPHAGE) 500 MG tablet Take 1 tablet (500 mg total) by mouth 2 (two) times daily with a meal. Patient not taking: Reported on 04/19/2019 02/25/19 06/25/19  Scot Jun, FNP    Family History Family History  Problem Relation Age of Onset  . Hypertension Mother   . Hypertension Father   . Hypertension Maternal Grandmother     Social History Social History   Tobacco Use  . Smoking status: Current Some Day Smoker    Packs/day: 0.50    Types: Cigarettes  . Smokeless tobacco: Current User  Substance Use Topics  .  Alcohol use: Yes    Alcohol/week: 12.0 standard drinks    Types: 12 Cans of beer per week    Comment: social  . Drug use: No     Allergies   Patient has no known allergies.   Review of Systems Review of Systems  Constitutional: Negative for fever and unexpected weight change.  Gastrointestinal: Negative for constipation.       Neg for fecal incontinence  Genitourinary: Negative for difficulty urinating, flank pain and hematuria.       Negative for urinary incontinence or retention  Musculoskeletal: Positive for back pain.  Skin: Negative for color change.       +lipoma  Neurological: Negative for weakness and numbness.       Negative for  saddle paresthesias      Physical Exam Updated Vital Signs BP (!) 167/98 (BP Location: Left Arm)   Pulse 100   Temp 99.1 F (37.3 C) (Oral) Comment: smoker  Resp 18   SpO2 100%   Physical Exam Vitals signs and nursing note reviewed.  Constitutional:      Appearance: He is well-developed.  HENT:     Head: Normocephalic and atraumatic.  Eyes:     Conjunctiva/sclera: Conjunctivae normal.  Neck:     Musculoskeletal: Normal range of motion.  Abdominal:     Palpations: Abdomen is soft.     Tenderness: There is no abdominal tenderness.  Musculoskeletal: Normal range of motion.     Cervical back: He exhibits normal range of motion, no tenderness and no bony tenderness.     Thoracic back: He exhibits normal range of motion, no tenderness and no bony tenderness.     Lumbar back: He exhibits tenderness. He exhibits normal range of motion and no bony tenderness.     Comments: No step-off noted with palpation of spine.   Skin:    General: Skin is warm and dry.     Comments: Approximately 4 cm, soft, subcutaneous nodule overlying the left anterior hip consistent with a lipoma.  No overlying skin changes or signs of infection.  Area is very soft and nontender to exam.  Neurological:     Mental Status: He is alert.     Sensory: No sensory deficit.     Motor: No abnormal muscle tone.     Deep Tendon Reflexes: Reflexes are normal and symmetric.     Comments: 5/5 strength in entire lower extremities bilaterally. No sensation deficit.       ED Treatments / Results  Labs (all labs ordered are listed, but only abnormal results are displayed) Labs Reviewed - No data to display  EKG None  Radiology No results found.  Procedures Procedures (including critical care time)  Medications Ordered in ED Medications  dexamethasone (DECADRON) injection 10 mg (has no administration in time range)     Initial Impression / Assessment and Plan / ED Course  I have reviewed the triage vital  signs and the nursing notes.  Pertinent labs & imaging results that were available during my care of the patient were reviewed by me and considered in my medical decision making (see chart for details).        6:35 PM Patient seen and examined. Pt requesting trial of IM steroids. We can try -- I discussed it may or may not help. Will RX robaxin.  He requested narcotics, I declined.  Vital signs reviewed and are as follows: Vitals:   06/25/19 1433  BP: (!) 167/98  Pulse: 100  Resp: 18  Temp: 99.1 F (37.3 C)  SpO2: 100%    No red flag s/s of low back pain. Patient was counseled on back pain precautions and told to do activity as tolerated but do not lift, push, or pull heavy objects more than 10 pounds for the next week.  Patient counseled to use ice or heat on back for no longer than 15 minutes every hour.   Patient counseled on proper use of muscle relaxant medication.  They were told not to drink alcohol, drive any vehicle, or do any dangerous activities while taking this medication.  Patient verbalized understanding.  We will refer to general surgery for lipoma removal.  Patient does not have the means to see a dermatologist.  He does have an orange card.  Discussed that surgery may or may not perform removals of these, however he would have to ask.  Patient urged to follow-up with PCP if pain does not improve with treatment and rest or if pain becomes recurrent. Urged to return with worsening severe pain, loss of bowel or bladder control, trouble walking.   The patient verbalizes understanding and agrees with the plan.  Final Clinical Impressions(s) / ED Diagnoses   Final diagnoses:  Acute bilateral low back pain without sciatica  Lipoma of torso   Back pain: No neurological deficits. Patient is ambulatory. No warning symptoms of back pain including: fecal incontinence, urinary retention or overflow incontinence, night sweats, waking from sleep with back pain, unexplained  fevers or weight loss, h/o cancer, IVDU, recent trauma. No concern for cauda equina, epidural abscess, or other serious cause of back pain. Conservative measures such as rest, ice/heat and pain medicine indicated with PCP follow-up if no improvement with conservative management.   Lipoma: Chronic.  Plan as above.  No indication for emergent intervention.   ED Discharge Orders         Ordered    methocarbamol (ROBAXIN) 500 MG tablet  4 times daily     06/25/19 1830           Carlisle Cater, PA-C 06/25/19 1837    Valarie Merino, MD 06/25/19 2312

## 2019-06-28 ENCOUNTER — Telehealth: Payer: Self-pay | Admitting: Family Medicine

## 2019-06-28 NOTE — Telephone Encounter (Signed)
P would like a copy of his CAFA letter mailed out to him, please follow up

## 2019-06-28 NOTE — Telephone Encounter (Signed)
Pt was informed that he is approve for CAFA and we will be sending a copy of the letter to him

## 2019-07-05 ENCOUNTER — Ambulatory Visit: Payer: Self-pay

## 2019-07-05 MED FILL — levoFLOXacin 750 MG TABS: 750 | 3 days supply | Qty: 3 | Fill #0

## 2019-07-09 ENCOUNTER — Telehealth: Payer: Self-pay

## 2019-07-09 NOTE — Telephone Encounter (Signed)

## 2019-07-12 ENCOUNTER — Other Ambulatory Visit: Payer: Self-pay

## 2019-07-12 ENCOUNTER — Ambulatory Visit (INDEPENDENT_AMBULATORY_CARE_PROVIDER_SITE_OTHER): Payer: Self-pay | Admitting: Family Medicine

## 2019-07-12 VITALS — BP 157/106 | HR 85 | Temp 97.2°F | Resp 18 | Wt 267.8 lb

## 2019-07-12 DIAGNOSIS — Z91148 Patient's other noncompliance with medication regimen for other reason: Secondary | ICD-10-CM

## 2019-07-12 DIAGNOSIS — Z9114 Patient's other noncompliance with medication regimen: Secondary | ICD-10-CM

## 2019-07-12 DIAGNOSIS — R229 Localized swelling, mass and lump, unspecified: Secondary | ICD-10-CM

## 2019-07-12 DIAGNOSIS — R609 Edema, unspecified: Secondary | ICD-10-CM

## 2019-07-12 DIAGNOSIS — E669 Obesity, unspecified: Secondary | ICD-10-CM

## 2019-07-12 DIAGNOSIS — R601 Generalized edema: Secondary | ICD-10-CM

## 2019-07-12 DIAGNOSIS — Z6839 Body mass index (BMI) 39.0-39.9, adult: Secondary | ICD-10-CM

## 2019-07-12 DIAGNOSIS — R21 Rash and other nonspecific skin eruption: Secondary | ICD-10-CM

## 2019-07-12 DIAGNOSIS — R600 Localized edema: Secondary | ICD-10-CM

## 2019-07-12 DIAGNOSIS — I10 Essential (primary) hypertension: Secondary | ICD-10-CM

## 2019-07-12 DIAGNOSIS — R2242 Localized swelling, mass and lump, left lower limb: Secondary | ICD-10-CM

## 2019-07-12 MED ORDER — TRIAMTERENE-HCTZ 37.5-25 MG PO TABS: 1.0000 | ORAL_TABLET | Freq: Every day | ORAL | 1 refills | Status: DC

## 2019-07-12 MED ORDER — TRIAMCINOLONE ACETONIDE 0.1 % EX CREA: 1.0000 "application " | TOPICAL_CREAM | Freq: Two times a day (BID) | CUTANEOUS | 0 refills | Status: DC

## 2019-07-12 NOTE — Patient Instructions (Signed)
A new prescription will be sent to your pharmacy for a blood pressure medication called triamterene-HCTZ.  This medication will help lower your blood pressure but also help with the swelling in your legs.  Please take 1 pill daily.  Please follow-up in 3 to 4 weeks for blood pressure recheck and blood work.  Call or return sooner if you are having any issues with the medication, increased swelling in your legs or any concerns.  You can stop the use of furosemide and potassium.

## 2019-07-12 NOTE — Progress Notes (Signed)
Established Patient Office Visit  Subjective:  Patient ID: Randy Hansen, male    DOB: 1960-11-06  Age: 58 y.o. MRN: [id number]  CC:  Chief Complaint  Patient presents with  . Leg Swelling    HPI Randy Hansen presents in follow-up of issues with recurrent peripheral edema and hypertension.  He reports that he stopped taking the Lasix and potassium because his swelling went away and he also felt that his blood pressure was controlled.  Patient however now states that he has had recurrence of lower extremity swelling.  He denies headaches or dizziness related to his blood pressure.  Upon questioning, he has not actually checked his blood pressure, he just felt that his blood pressure was improved.  He also has complaint at today's visit of a soft tissue mass on the left anterior hip for which he would like a referral to have this mass removed.  He reports that the mass has been present for years and is nontender.  He feels like the area may be getting slightly larger.  He has had no further redness or increased warmth to the lower extremities but he reports that he has a dry recurrent rash at the ankles for which he would like a cream to help with the rash and itching.  Otherwise he feels that he is doing okay.  He reports some shortness of breath when working but states that he also has a somewhat strenuous job and has to wear a mask and he believes that the mask is causing him to feel short of breath at work.  Past Medical History:  Diagnosis Date  . Acute medial meniscal tear     Past Surgical History:  Procedure Laterality Date  . NO PAST SURGERIES      Family History  Problem Relation Age of Onset  . Hypertension Mother   . Hypertension Father   . Hypertension Maternal Grandmother     Social History   Socioeconomic History  . Marital status: Single    Spouse name: Not on file  . Number of children: Not on file  . Years of education: Not on file  . Highest education  level: Not on file  Occupational History  . Not on file  Social Needs  . Financial resource strain: Not on file  . Food insecurity    Worry: Not on file    Inability: Not on file  . Transportation needs    Medical: Not on file    Non-medical: Not on file  Tobacco Use  . Smoking status: Current Some Day Smoker    Packs/day: 0.50    Types: Cigarettes  . Smokeless tobacco: Current User  Substance and Sexual Activity  . Alcohol use: Yes    Alcohol/week: 12.0 standard drinks    Types: 12 Cans of beer per week    Comment: social  . Drug use: No  . Sexual activity: Yes  Lifestyle  . Physical activity    Days per week: Not on file    Minutes per session: Not on file  . Stress: Not on file  Relationships  . Social Herbalist on phone: Not on file    Gets together: Not on file    Attends religious service: Not on file    Active member of club or organization: Not on file    Attends meetings of clubs or organizations: Not on file    Relationship status: Not on file  . Intimate partner  violence    Fear of current or ex partner: Not on file    Emotionally abused: Not on file    Physically abused: Not on file    Forced sexual activity: Not on file  Other Topics Concern  . Not on file  Social History Narrative  . Not on file    Outpatient Medications Prior to Visit  Medication Sig Dispense Refill  . furosemide (LASIX) 20 MG tablet Take 1 tablet (20 mg total) by mouth daily. 30 tablet 3  . hydrocortisone cream 0.5 % Apply 1 application topically 2 (two) times daily. As needed for itching 60 g 3  . hydrOXYzine (ATARAX/VISTARIL) 25 MG tablet Take 1 tablet (25 mg total) by mouth 3 (three) times daily as needed. For itching 60 tablet 0  . ibuprofen (ADVIL) 600 MG tablet Take 1 tablet (600 mg total) by mouth every 8 (eight) hours as needed. Take after eating 60 tablet 0  . methocarbamol (ROBAXIN) 500 MG tablet Take 1 tablet (500 mg total) by mouth 4 (four) times daily. 20  tablet 0  . potassium chloride (K-DUR) 10 MEQ tablet Take 1 tablet (10 mEq total) by mouth daily. 30 tablet 3   No facility-administered medications prior to visit.     No Known Allergies  ROS Review of Systems  Constitutional: Positive for fatigue (Occasional). Negative for chills and fever.  HENT: Negative for sore throat and trouble swallowing.   Eyes: Negative for photophobia and visual disturbance.  Respiratory: Positive for shortness of breath (When working while wearing a mask). Negative for cough.   Cardiovascular: Positive for leg swelling. Negative for chest pain and palpitations.  Gastrointestinal: Negative for abdominal pain, constipation, diarrhea and nausea.  Endocrine: Negative for polydipsia, polyphagia and polyuria.  Genitourinary: Negative for dysuria and frequency.  Musculoskeletal: Negative for arthralgias and gait problem.  Skin: Positive for rash. Negative for color change and wound.  Neurological: Negative for dizziness and headaches.  Hematological: Negative for adenopathy. Does not bruise/bleed easily.  Psychiatric/Behavioral: Negative for self-injury and suicidal ideas.      Objective:    Physical Exam  Constitutional: He is oriented to person, place, and time. He appears well-developed and well-nourished.  Well-nourished well-developed obese male in no acute distress.  Patient wearing facial mask as per office COVID-19 precautions but patient later removed the mask to answer a phone call  Neck: Normal range of motion. Neck supple. No JVD present.  Cardiovascular: Normal rate and regular rhythm.  Pulmonary/Chest: Effort normal and breath sounds normal. No respiratory distress. He has no wheezes.  Abdominal: Soft. There is no abdominal tenderness. There is no rebound and no guarding.  Truncal obesity  Musculoskeletal:        General: Edema (Mild, nonpitting distal lower extremity edema bilaterally) present. No tenderness.     Comments: No CVA tenderness.   Lymphadenopathy:    He has no cervical adenopathy.  Neurological: He is alert and oriented to person, place, and time.  Skin: Rash noted.  Patient with areas of dry flaky skin at the ankles bilaterally  Nursing note and vitals reviewed.   BP (!) 161/113   Pulse 85   Temp (!) 97.2 F (36.2 C) (Temporal)   Resp 18   Wt 267 lb 12.8 oz (121.5 kg)   SpO2 93%   BMI 39.55 kg/m  Repeat BP 157/106 Wt Readings from Last 3 Encounters:  07/12/19 267 lb 12.8 oz (121.5 kg)  06/07/19 272 lb 9.6 oz (123.7 kg)  04/19/19  262 lb 6.4 oz (119 kg)     Health Maintenance Due  Topic Date Due  . COLONOSCOPY  04/01/2013      Lab Results  Component Value Date   TSH 1.220 02/22/2019   Lab Results  Component Value Date   WBC 5.3 02/22/2019   HGB 14.9 02/22/2019   HCT 44.8 02/22/2019   MCV 92 02/22/2019   PLT 248 02/22/2019   Lab Results  Component Value Date   NA 142 06/07/2019   K 4.2 06/07/2019   CO2 26 06/07/2019   GLUCOSE 100 (H) 06/07/2019   BUN 9 06/07/2019   CREATININE 1.04 06/07/2019   BILITOT <0.2 06/07/2019   ALKPHOS 92 06/07/2019   AST 19 06/07/2019   ALT 30 06/07/2019   PROT 7.2 06/07/2019   ALBUMIN 4.6 06/07/2019   CALCIUM 10.2 06/07/2019   ANIONGAP 12 10/01/2017   Lab Results  Component Value Date   CHOL 189 04/19/2019   Lab Results  Component Value Date   HDL 46 04/19/2019   Lab Results  Component Value Date   LDLCALC 81 04/19/2019   Lab Results  Component Value Date   TRIG 309 (H) 04/19/2019   Lab Results  Component Value Date   CHOLHDL 4.1 04/19/2019   Lab Results  Component Value Date   HGBA1C 6.3 (H) 06/07/2019      Assessment & Plan:  1. Essential hypertension 2. Peripheral edema Blood pressure is elevated again at today's visit and patient has not been taking Lasix and potassium.  He also continues to complain of lower extremity edema and again is not taking medications that would help with this issue.  Prescription sent to patient's  pharmacy for triamterene hydrochlorothiazide which should help with both blood pressure as well as with peripheral edema.  Patient reminded of importance of taking the medication daily to help with blood pressure and edema.  He will return in 4 weeks to have BMP at that time and follow-up of new medication use. - triamterene-hydrochlorothiazide (MAXZIDE-25) 37.5-25 MG tablet; Take 1 tablet by mouth daily. For swelling and blood pressure  Dispense: 90 tablet; Refill: 1  3. Localized skin mass, lump, or swelling Patient with a lemon size soft, compressible skin mass at the left anterior upper thigh/groin.  He will be referred to general surgery for removal.  Area likely represents a lipoma. - Ambulatory referral to General Surgery  4. Rash and nonspecific skin eruption Patient with complaint of itchy rash at the ankles which is recurrent.  He reports that he needs something to help with the itching and rash.  Prescription provided for triamcinolone cream. - triamcinolone cream (KENALOG) 0.1 %; Apply 1 application topically 2 (two) times daily.  Dispense: 30 g; Refill: 0  5. Noncompliance with medication treatment due to intermittent use of medication Patient seems to have a poor understanding of medical condition-hypertension and poor insight.  Patient was asked to be compliant with daily use of medication to help with hypertension and his complaints of recurrent peripheral edema.   An After Visit Summary was printed and given to the patient.  Follow-up: Return in about 4 weeks (around 08/09/2019) for BP f/up.  Antony Blackbird, MD

## 2019-07-13 MED FILL — ?TRIAMTERENE/HCTZ 37.5/25TB: 37.5-25 | 30 days supply | Qty: 30 | Fill #0

## 2019-07-13 MED FILL — TRIAMCINOLONE ACETONIDE 0.1: 0.1 | 15 days supply | Qty: 30 | Fill #0

## 2019-07-15 ENCOUNTER — Ambulatory Visit: Payer: Self-pay | Admitting: General Surgery

## 2019-07-22 ENCOUNTER — Ambulatory Visit: Payer: Self-pay | Admitting: General Surgery

## 2019-07-27 ENCOUNTER — Ambulatory Visit: Payer: Self-pay | Admitting: General Surgery

## 2019-07-29 ENCOUNTER — Encounter: Payer: Self-pay | Admitting: General Surgery

## 2019-07-29 ENCOUNTER — Other Ambulatory Visit: Payer: Self-pay

## 2019-07-29 ENCOUNTER — Ambulatory Visit (INDEPENDENT_AMBULATORY_CARE_PROVIDER_SITE_OTHER): Payer: Self-pay | Admitting: General Surgery

## 2019-07-29 VITALS — BP 152/108 | HR 82 | Temp 97.3°F | Ht 71.0 in | Wt 262.0 lb

## 2019-07-29 DIAGNOSIS — M7989 Other specified soft tissue disorders: Secondary | ICD-10-CM

## 2019-07-29 NOTE — Patient Instructions (Signed)
Our surgery scheduler will contact you within 24-48 hours to discuss surgery options. Please have BLUE surgery sheet available when she contacts you to get you scheduled. If you have any questions or concerns, please feel free to contact our office.   Lipoma  A lipoma is a noncancerous (benign) tumor that is made up of fat cells. This is a very common type of soft-tissue growth. Lipomas are usually found under the skin (subcutaneous). They may occur in any tissue of the body that contains fat. Common areas for lipomas to appear include the back, shoulders, buttocks, and thighs.  Lipomas grow slowly, and they are usually painless. Most lipomas do not cause problems and do not require treatment. What are the causes? The cause of this condition is not known. What increases the risk? You are more likely to develop this condition if:  You are 62-11 years old.  You have a family history of lipomas. What are the signs or symptoms? A lipoma usually appears as a small, round bump under the skin. In most cases, the lump will:  Feel soft or rubbery.  Not cause pain or other symptoms. However, if a lipoma is located in an area where it pushes on nerves, it can become painful or cause other symptoms. How is this diagnosed? A lipoma can usually be diagnosed with a physical exam. You may also have tests to confirm the diagnosis and to rule out other conditions. Tests may include:  Imaging tests, such as a CT scan or MRI.  Removal of a tissue sample to be looked at under a microscope (biopsy). How is this treated? Treatment for this condition depends on the size of the lipoma and whether it is causing any symptoms.  For small lipomas that are not causing problems, no treatment is needed.  If a lipoma is bigger or it causes problems, surgery may be done to remove the lipoma. Lipomas can also be removed to improve appearance. Most often, the procedure is done after applying a medicine that numbs the  area (local anesthetic). Follow these instructions at home:  Watch your lipoma for any changes.  Keep all follow-up visits as told by your health care provider. This is important. Contact a health care provider if:  Your lipoma becomes larger or hard.  Your lipoma becomes painful, red, or increasingly swollen. These could be signs of infection or a more serious condition. Get help right away if:  You develop tingling or numbness in an area near the lipoma. This could indicate that the lipoma is causing nerve damage. Summary  A lipoma is a noncancerous tumor that is made up of fat cells.  Most lipomas do not cause problems and do not require treatment.  If a lipoma is bigger or it causes problems, surgery may be done to remove the lipoma. This information is not intended to replace advice given to you by your health care provider. Make sure you discuss any questions you have with your health care provider. Document Released: 08/30/2002 Document Revised: 08/26/2017 Document Reviewed: 08/26/2017 Elsevier Patient Education  [place].

## 2019-07-29 NOTE — Progress Notes (Signed)
Patient ID: Randy Hansen, male   DOB: [birth year], 57 y.o.   MRN: [id number]  Chief Complaint  Patient presents with  . Breast Mass     new pt ref Dr.Cammie Fulp skin mass left anterior hip/groin area    HPI Randy Hansen is a 58 y.o. male.  He has been referred by his primary care provider for evaluation of a soft tissue mass on his left anterior hip.  He states that it has been present for at least 10 years.  It has progressively gotten bigger.  He denies any pain associated with it, but it does bother him when he is more active.  He feels it bouncing and jostling.  It has never become hot, red, or spontaneously drained any material.  He denies any fevers or chills.  No nausea or vomiting.  He reports that many of his family members have had similar lesions.  Some have had them removed, while others have left them in place.  He is interested in having his mass removed.  He denies having any other similar lesions on his body.   Past Medical History:  Diagnosis Date  . Acute medial meniscal tear     Past Surgical History:  Procedure Laterality Date  . NO PAST SURGERIES      Family History  Problem Relation Age of Onset  . Hypertension Mother   . Hypertension Father   . Hypertension Maternal Grandmother     Social History Social History   Tobacco Use  . Smoking status: Current Some Day Smoker    Packs/day: 0.50    Types: Cigarettes  . Smokeless tobacco: Current User  Substance Use Topics  . Alcohol use: Yes    Alcohol/week: 12.0 standard drinks    Types: 12 Cans of beer per week    Comment: social  . Drug use: No    No Known Allergies  No current outpatient medications on file.   No current facility-administered medications for this visit.    According to Dr. Siri Cole last note, however, he is supposed to be taking triamterene- hydrochlorothiazide for hypertension and peripheral edema.  Review of Systems Review of Systems  Respiratory: Positive for shortness  of breath.        With exertion  Cardiovascular: Positive for leg swelling.  Musculoskeletal: Positive for back pain.  All other systems reviewed and are negative.   Blood pressure (!) 152/108, pulse 82, temperature (!) 97.3 F (36.3 C), temperature source Temporal, height 5\' 11"  (1.803 m), weight 262 lb (118.8 kg), SpO2 97 %. Body mass index is 36.54 kg/m.  Physical Exam Physical Exam Constitutional:      General: He is in acute distress.     Appearance: He is obese.  HENT:     Head: Normocephalic and atraumatic.     Nose:     Comments: Covered with a mask secondary to COVID-19 precautions    Mouth/Throat:     Comments: Covered with a mask secondary to COVID-19 precautions Eyes:     General: No scleral icterus.       Right eye: No discharge.        Left eye: No discharge.  Cardiovascular:     Rate and Rhythm: Normal rate and regular rhythm.  Pulmonary:     Effort: Pulmonary effort is normal. No respiratory distress.  Abdominal:     General: Bowel sounds are normal.     Palpations: Abdomen is soft.     Comments: Protuberant, consistent with  his level of obesity  Musculoskeletal:     Right lower leg: No edema.     Left lower leg: No edema.  Skin:    General: Skin is warm and dry.          Comments: There is a soft, mobile, well-circumscribed mass on the patient's anterior left hip.  There is a central area that has some fluctuance to it.  It is nontender and does not seem to involve any of the deeper tissue layers.  Neurological:     General: No focal deficit present.     Mental Status: He is alert and oriented to person, place, and time.  Psychiatric:        Mood and Affect: Mood normal.        Behavior: Behavior normal.     Data Reviewed As above, I reviewed Dr. Ander Gaster Fulp's most recent clinic note.  I also reviewed labs performed on 14 September.  These do show an elevated hemoglobin A1c at 6.3.  Assessment This is a 58 year old man who has a soft tissue  mass on his left anterior hip.  He states that it has been present for 10 years but that it seems to be getting larger.  He would like to have it removed.  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  I have offered him surgical resection.  He was hypertensive in clinic today and is not taking his prescribed medication.  He also has an elevated hemoglobin A1c.  He says that he is aware of this and trying to monitor his oral intake and control his prediabetes with diet.  Plan I discussed the risks of surgical removal of soft tissue masses with him.  These include, but are not limited to, bleeding, infection, recurrence, wound healing problems, scar, pain, damage to surrounding tissues, and the complications of anesthesia.  He would like to proceed.  We will work on scheduling him at a mutually convenient date.  I also counseled him on the risks of untreated hypertension and diabetes.  I have encouraged him to follow-up with his primary care provider for closer management of these conditions.    Randy Hansen 07/29/2019, 5:10 PM

## 2019-08-02 ENCOUNTER — Telehealth: Payer: Self-pay | Admitting: General Surgery

## 2019-08-02 NOTE — Telephone Encounter (Signed)
I have called patient to go over the information below. No answer. I have left a message on patient's voicemail to call the office back.   Surgery Date: 08/11/19 with Dr Lacretia Leigh of left hip lipoma.  Preadmission Testing Date: 08/05/19 between 8-12:00pm-phone interview.  Covid Testing Date: 08/06/19 between 8-10:30am - patient advised to go to the [facility]  [employer] Video sent via [employer] [facility] and [employer].  Patient has been made aware to call [phone], between 1-3:00pm the day before surgery, to find out what time to arrive.

## 2019-08-04 NOTE — Telephone Encounter (Signed)
I have called patient again to go over surgery information in the previous note. No answer. I have left another voicemail. I will mail the information to the patient. I have also called the patient's mother (second contact), no answer. There was no voicemail, unable to leave a message.

## 2019-08-05 ENCOUNTER — Other Ambulatory Visit
Admission: RE | Admit: 2019-08-05 | Discharge: 2019-08-05 | Disposition: A | Discharge status: Home / Self Care | Payer: Self-pay | Source: Ambulatory Visit | Attending: Pulmonary Disease | Admitting: Pulmonary Disease

## 2019-08-05 ENCOUNTER — Other Ambulatory Visit: Payer: Self-pay

## 2019-08-05 ENCOUNTER — Other Ambulatory Visit: Payer: Self-pay | Admitting: General Surgery

## 2019-08-05 DIAGNOSIS — M7989 Other specified soft tissue disorders: Secondary | ICD-10-CM

## 2019-08-05 NOTE — Patient Instructions (Signed)
Your procedure is scheduled on: Wednesday 08/11/19  Report to [facility]. To find out your arrival time please call [phone] between 1PM - 3PM on Tuesday 08/10/19.   Remember: Instructions that are not followed completely may result in serious medical risk, up to and including death, or upon the discretion of your surgeon and anesthesiologist your surgery may need to be rescheduled.       _X__ 1. Do not eat food after midnight the night before your procedure.                 No gum chewing or hard candies. You may drink clear liquids up to 2 hours                 before you are scheduled to arrive for your surgery- DO NOT drink clear                 liquids within 2 hours of the start of your surgery.                 Clear Liquids include:  water, apple juice without pulp, clear carbohydrate                 drink such as Clearfast or Gatorade, Black Coffee or Tea (Do not add                 anything to coffee or tea).   __X__2.  On the morning of surgery brush your teeth with toothpaste and water, you may rinse your mouth with mouthwash if you wish.  Do not swallow any toothpaste or mouthwash.       _X__ 3.  No Alcohol for 24 hours before or after surgery.     _X__ 4.  Do Not Smoke or use e-cigarettes For 24 Hours Prior to Your Surgery.                 Do not use any chewable tobacco products for at least 6 hours prior to                 Surgery.    __X__5.  Notify your doctor if there is any change in your medical condition      (cold, fever, infections).       Do not wear jewelry, make-up, hairpins, clips or nail polish. Do not wear lotions, powders, or perfumes.  Do not shave 48 hours prior to surgery. Men may shave face and neck. Do not bring valuables to the hospital.     [facility] is not responsible for any belongings or valuables.   Contacts, dentures/partials or body piercings may not be worn into  surgery. Bring a case for your contacts, glasses or hearing aids, a denture cup will be supplied.     Patients discharged the day of surgery will not be allowed to drive home.    __X__ Take these medicines the morning of surgery with A SIP OF WATER:     1. NONE     __X__ Stop Anti-inflammatories 7 days before surgery such as Advil, Ibuprofen, Motrin, BC or Goodies Powder, Naprosyn, Naproxen, Aleve, Aspirin, Meloxicam. May take Tylenol if needed for pain or discomfort.     __X__ Don't start taking any new herbal supplements until after your surgery.

## 2019-08-06 ENCOUNTER — Other Ambulatory Visit: Admission: RE | Admit: 2019-08-06 | Payer: Self-pay | Source: Ambulatory Visit

## 2019-08-06 NOTE — Telephone Encounter (Signed)
Patient has called back and all information for surgery has been given. Patient understands and is ok with all dates and times.

## 2019-08-06 NOTE — Progress Notes (Signed)
Pt missed covid testing and lab appt. Rescheduled appt for Monday August 09, 2019 between 8:30 am and 12 pm. Unable to leave message voice mailbox full.

## 2019-08-09 ENCOUNTER — Other Ambulatory Visit
Admission: RE | Admit: 2019-08-09 | Discharge: 2019-08-09 | Disposition: A | Discharge status: Home / Self Care | Payer: Self-pay | Source: Ambulatory Visit | Attending: General Surgery | Admitting: General Surgery

## 2019-08-09 NOTE — Pre-Procedure Instructions (Addendum)
Left message on voicemail for patient to come for pre surgical covid testing.  Called Dr. Celine Ahr to inform her that patient has not responded to several calls from preadmit testing nurses. Gave the info to Angie and she was to let MD know.  She also said that he required transportation to the hospital and maybe he did not obtain that.

## 2019-08-10 ENCOUNTER — Telehealth: Payer: Self-pay | Admitting: General Surgery

## 2019-08-10 NOTE — Telephone Encounter (Signed)
Patient never showed for covid test or pre op appt. sx has been cancelled for tomorrow. Mutliple attempts have been made to contact pt by pre admission, same day surgery as well as from our office. Dr Celine Ahr has been notified.

## 2019-08-11 ENCOUNTER — Encounter: Admission: RE | Payer: Self-pay | Source: Home / Self Care

## 2019-08-11 ENCOUNTER — Ambulatory Visit: Admission: RE | Admit: 2019-08-11 | Payer: Self-pay | Source: Home / Self Care | Admitting: General Surgery

## 2019-08-11 SURGERY — EXCISION LIPOMA
Anesthesia: Choice | Laterality: Left

## 2019-08-27 ENCOUNTER — Telehealth: Payer: Self-pay

## 2019-08-27 NOTE — Telephone Encounter (Signed)
Called patient to do their pre-visit COVID screening.  Call went to voicemail. Unable to do prescreening.  

## 2019-08-30 ENCOUNTER — Ambulatory Visit: Payer: Self-pay

## 2019-09-29 ENCOUNTER — Telehealth: Payer: Self-pay | Admitting: Family Medicine

## 2019-09-29 NOTE — Telephone Encounter (Signed)
Pt called , I return his Call but no answer LVM inform him that my schedule is book for this week and next that he need to call in a Monday to schedule an appt since specialist now they reduce the appt available for financial do to Covid

## 2019-10-25 ENCOUNTER — Other Ambulatory Visit: Payer: Self-pay

## 2019-10-25 ENCOUNTER — Ambulatory Visit: Payer: Self-pay | Attending: Family Medicine

## 2019-10-27 MED FILL — HYDROCODON-APAP 5-325: 5-325 | 1 days supply | Qty: 1 | Fill #0

## 2019-10-27 MED FILL — levoFLOXacin 750 MG TABS: 750 | 3 days supply | Qty: 3 | Fill #0

## 2019-10-29 ENCOUNTER — Telehealth: Payer: Self-pay

## 2019-10-29 ENCOUNTER — Telehealth: Payer: Self-pay | Admitting: General Surgery

## 2019-10-29 NOTE — Telephone Encounter (Signed)
Patient is calling and is asking when he comes in on 11/04/19 if he could have his procedure done this day. Please call patient and advise, he can be reached at [phone].

## 2019-10-29 NOTE — Telephone Encounter (Signed)
Per Dr.Cannon advised me to notify patient "No. That is a clinic day, not an OR day. I haven't seen him since November and he either cancelled the last time I scheduled his operation or no-showed for his pre-op." Patient verbalized understanding and was keep scheduled appointment for 11/04/19 at 11am.

## 2019-10-29 NOTE — Telephone Encounter (Signed)
Called patient to do their pre-visit COVID screening.  Call went to voicemail. Unable to do prescreening.  

## 2019-11-01 ENCOUNTER — Telehealth: Payer: Self-pay | Admitting: Family Medicine

## 2019-11-01 ENCOUNTER — Ambulatory Visit: Payer: Self-pay | Admitting: Internal Medicine

## 2019-11-01 NOTE — Telephone Encounter (Signed)
I received an email from [employer], Patient's food stamp award letter is expired.. Patient would need to provide current FS letter.  Will hold cafa application for 14 days for patient to provide updated letter .Pt was inform and he going to try to contact the office to fax the information to [place]

## 2019-11-04 ENCOUNTER — Ambulatory Visit: Payer: Self-pay | Admitting: General Surgery

## 2019-11-09 ENCOUNTER — Ambulatory Visit: Payer: Self-pay | Admitting: General Surgery

## 2019-11-11 ENCOUNTER — Ambulatory Visit: Payer: Self-pay | Admitting: General Surgery

## 2019-11-18 ENCOUNTER — Ambulatory Visit: Payer: Self-pay | Admitting: General Surgery

## 2019-12-02 MED FILL — TAMSULOSIN HCL 0.4 MG CAP: 0.4 | 30 days supply | Qty: 30 | Fill #0

## 2019-12-02 MED FILL — SILDENAFIL 20 MG TABLET: 20 | 30 days supply | Qty: 20 | Fill #0

## 2019-12-07 ENCOUNTER — Ambulatory Visit: Payer: Self-pay | Admitting: General Surgery

## 2019-12-13 ENCOUNTER — Encounter: Payer: Self-pay | Admitting: Radiation Oncology

## 2019-12-13 NOTE — Progress Notes (Signed)
GU Location of Tumor / Histology: prostatic adenocarcinoma  If Prostate Cancer, Gleason Score is (3 + 4) and PSA is (7.15) on 10/28/2019. Prostate volume: 46.8 g.  Randy Hansen was referred to Dr. Lovena Neighbours by Molli Barrows, FNP for further evaluation of an elevated PSA.  Biopsies of prostate (if applicable) revealed:   Past/Anticipated interventions by urology, if any: prostate biopsy, referral to discuss radiation therapy options  Past/Anticipated interventions by medical oncology, if any: no  Weight changes, if any: Reports 100 lb unintentional weight loss over the course of 1 year  Bowel/Bladder complaints, if any: IPSS 19. SHIM 23. Reports urinary frequency and urgency. Reports an intermittent stream with difficulty emptying. Reports nocturia x 1. Endorses taking tamsulosin and sildenafil. Denies any bowel complaints.  Nausea/Vomiting, if any: denies  Pain issues, if any:  Reports intermittent low back pain x 3 months.  SAFETY ISSUES:  Prior radiation? denies  Pacemaker/ICD? denies  Possible current pregnancy? no, male patient  Is the patient on methotrexate? denies  Current Complaints / other details:  59 year old male. Single. Smokes and drinks. Everyday smoker-last chest xray 08/2018. Mother had breast cancer manage with a mastectomy.

## 2019-12-14 ENCOUNTER — Encounter: Payer: Self-pay | Admitting: Radiation Oncology

## 2019-12-14 ENCOUNTER — Ambulatory Visit
Admission: RE | Admit: 2019-12-14 | Discharge: 2019-12-14 | Disposition: A | Discharge status: Home / Self Care | Payer: Self-pay | Source: Ambulatory Visit | Attending: Radiation Oncology | Admitting: Radiation Oncology

## 2019-12-14 ENCOUNTER — Other Ambulatory Visit: Payer: Self-pay

## 2019-12-14 VITALS — Ht 71.0 in | Wt 230.0 lb

## 2019-12-14 DIAGNOSIS — C61 Malignant neoplasm of prostate: Secondary | ICD-10-CM

## 2019-12-14 HISTORY — DX: Malignant neoplasm of prostate: C61

## 2019-12-14 NOTE — Progress Notes (Signed)
See progress note under physician encounter. 

## 2019-12-14 NOTE — Progress Notes (Signed)
Radiation Oncology         [phone] ________________________________  Initial Outpatient Consultation - Conducted via Telephone due to current COVID-19 concerns for limiting patient exposure  Name: Randy Hansen MRN: [id number]  Date: 12/14/2019  DOB: 07/03/1961  [id number], Carroll Sage, FNP  Davis Gourd*   REFERRING PHYSICIAN: Davis Gourd*  DIAGNOSIS: 59 y.o. gentleman with Stage T1c adenocarcinoma of the prostate with Gleason score of 3+4, and PSA of 7.15.    ICD-10-CM   1. Malignant neoplasm of prostate ([place])  C61     HISTORY OF PRESENT ILLNESS: Randy Hansen is a 59 y.o. male with a diagnosis of prostate cancer. He has a history of elevated PSA and was previously referred to urology in 2018 with a PSA of 4.4, but he did not show for the appointment. Recently, he was noted to have an elevated PSA of 4.1 by his primary care provider, Molli Barrows, NP. Accordingly, he was referred for evaluation in urology by Dr. Lovena Neighbours on 05/20/2019,  digital rectal examination was performed at that time revealing no nodules.    The patient proceeded to transrectal ultrasound with 12 biopsies of the prostate on 10/28/2019.  The prostate volume measured 46.8 cc.  PSA performed that day was 7.15.  Out of 12 core biopsies, 3 were positive.  The maximum Gleason score was 3+4, and this was seen in the right apex lateral. Additionally, Gleason 3+3 was seen in the left base (small focus) and left apex.  The patient reviewed the biopsy results with his urologist and he has kindly been referred today for discussion of potential radiation treatment options. He is accompanied by his fiance on the telephone call today.   PREVIOUS RADIATION THERAPY: No  PAST MEDICAL HISTORY:  Past Medical History:  Diagnosis Date  . Acute medial meniscal tear   . Prostate cancer ([place])       PAST SURGICAL HISTORY: Past Surgical History:  Procedure Laterality Date  . PROSTATE BIOPSY       FAMILY HISTORY:  Family History  Problem Relation Age of Onset  . Hypertension Mother   . Breast cancer Mother   . Hypertension Father   . Hypertension Maternal Grandmother   . Prostate cancer Neg Hx   . Colon cancer Neg Hx   . Pancreatic cancer Neg Hx     SOCIAL HISTORY:  Social History   Socioeconomic History  . Marital status: Single    Spouse name: Not on file  . Number of children: 1  . Years of education: Not on file  . Highest education level: Not on file  Occupational History  . Not on file  Tobacco Use  . Smoking status: Current Some Day Smoker    Packs/day: 0.50    Years: 40.00    Pack years: 20.00    Types: Cigarettes  . Smokeless tobacco: Current User  Substance and Sexual Activity  . Alcohol use: Yes    Alcohol/week: 12.0 standard drinks    Types: 12 Cans of beer per week    Comment: social  . Drug use: No  . Sexual activity: Yes  Other Topics Concern  . Not on file  Social History Narrative  . Not on file   Social Determinants of Health   Financial Resource Strain:   . Difficulty of Paying Living Expenses:   Food Insecurity:   . Worried About Charity fundraiser in the Last Year:   . [place] in the  Last Year:   Transportation Needs:   . Film/video editor (Medical):   Marland Kitchen Lack of Transportation (Non-Medical):   Physical Activity:   . Days of Exercise per Week:   . Minutes of Exercise per Session:   Stress:   . Feeling of Stress :   Social Connections:   . Frequency of Communication with Friends and Family:   . Frequency of Social Gatherings with Friends and Family:   . Attends Religious Services:   . Active Member of Clubs or Organizations:   . Attends Archivist Meetings:   Marland Kitchen Marital Status:   Intimate Partner Violence:   . Fear of Current or Ex-Partner:   . Emotionally Abused:   Marland Kitchen Physically Abused:   . Sexually Abused:     ALLERGIES: Patient has no known allergies.  MEDICATIONS:  Current Outpatient  Medications  Medication Sig Dispense Refill  . sildenafil (REVATIO) 20 MG tablet Take 20 mg by mouth 3 (three) times daily.    . tamsulosin (FLOMAX) 0.4 MG CAPS capsule Take 0.4 mg by mouth.     No current facility-administered medications for this encounter.    REVIEW OF SYSTEMS:  On review of systems, the patient reports that he is doing well overall. He denies any chest pain, shortness of breath, cough, fevers, chills, night sweats, unintended weight changes. He denies any bowel disturbances, and denies abdominal pain, nausea or vomiting. He reports intermittent low back pain for 3 months. His IPSS was 19, indicating severe urinary symptoms. He reports urinary frequency and urgency, intermittent stream, difficulty emptying, and nocturia x1. He reports he has not yet started on the tamsulosin because he has misplaced it. He endorses taking sildenafil. His SHIM was 23, indicating he has well controlled erectile dysfunction. A complete review of systems is obtained and is otherwise negative.    PHYSICAL EXAM:  Wt Readings from Last 3 Encounters:  12/14/19 230 lb (104.3 kg)  08/05/19 255 lb (115.7 kg)  07/29/19 262 lb (118.8 kg)   Temp Readings from Last 3 Encounters:  07/29/19 (!) 97.3 F (36.3 C) (Temporal)  07/12/19 (!) 97.2 F (36.2 C) (Temporal)  06/25/19 99.1 F (37.3 C) (Oral)   BP Readings from Last 3 Encounters:  07/29/19 (!) 152/108  07/12/19 (!) 157/106  06/25/19 (!) 150/105   Pulse Readings from Last 3 Encounters:  07/29/19 82  07/12/19 85  06/25/19 89   Pain Assessment Pain Score: 5 (Reports intermittent low back pain x 3 months) Pain Frequency: Intermittent Pain Loc: Back/10  Physical exam not performed in light of telephone consult visit format.   KPS = 100  100 - Normal; no complaints; no evidence of disease. 90   - Able to carry on normal activity; minor signs or symptoms of disease. 80   - Normal activity with effort; some signs or symptoms of  disease. 37   - Cares for self; unable to carry on normal activity or to do active work. 60   - Requires occasional assistance, but is able to care for most of his personal needs. 50   - Requires considerable assistance and frequent medical care. 21   - Disabled; requires special care and assistance. 54   - Severely disabled; hospital admission is indicated although death not imminent. 12   - Very sick; hospital admission necessary; active supportive treatment necessary. 10   - Moribund; fatal processes progressing rapidly. 0     - Dead  Karnofsky DA, [place], Craver LS and Burchenal  JH [phone]) The use of the nitrogen mustards in the palliative treatment of carcinoma: with particular reference to bronchogenic carcinoma Cancer 1 634-56  LABORATORY DATA:  Lab Results  Component Value Date   WBC 5.3 02/22/2019   HGB 14.9 02/22/2019   HCT 44.8 02/22/2019   MCV 92 02/22/2019   PLT 248 02/22/2019   Lab Results  Component Value Date   NA 142 06/07/2019   K 4.2 06/07/2019   CL 103 06/07/2019   CO2 26 06/07/2019   Lab Results  Component Value Date   ALT 30 06/07/2019   AST 19 06/07/2019   ALKPHOS 92 06/07/2019   BILITOT <0.2 06/07/2019     RADIOGRAPHY: No results found.    IMPRESSION/PLAN: This visit was conducted via Telephone to spare the patient unnecessary potential exposure in the healthcare setting during the current COVID-19 pandemic. 1. 59 y.o. gentleman with Stage T1c adenocarcinoma of the prostate with Gleason Score of 3+4, and PSA of 7.15. We discussed the patient's workup and outlined the nature of prostate cancer in this setting. The patient's T stage, Gleason's score, and PSA put him into the favorable intermediate risk group. Accordingly, he is eligible for a variety of potential treatment options including brachytherapy, 5.5 weeks of external radiation, or prostatectomy. We discussed the available radiation techniques, and focused on the details and logistics of  delivery. The patient is not an ideal candidate for brachytherapy given his current increased lower urinary tract symptoms. We discussed and outlined the risks, benefits, short and long-term effects associated with radiotherapy and compared and contrasted these with prostatectomy. We discussed the role of SpaceOAR in reducing the rectal toxicity associated with radiotherapy.  He and his fiance were encouraged to ask questions that were answered to their stated satisfaction.  At the end of the conversation the patient is most interested in moving forward with 5.5 weeks of external beam therapy. We will share our discussion with Dr. Lovena Neighbours and make arrangements for fiducial markers and SpaceOAR gel placement, first available, prior to simulation, to reduce rectal toxicity from radiotherapy. He is going to keep his scheduled appointment with Dr. Gloriann Loan on 12/23/2019 to discuss prostatectomy, but feels sure that he will ultimately proceed with external beam radiotherapy. The patient appears to have a good understanding of his disease and our treatment recommendations which are of curative intent and is in agreement with the stated plan.  Therefore, we will move forward with treatment planning accordingly, in anticipation of beginning IMRT in the near future.  If he should change his mind and prefer to proceed with prostatectomy instead, he will contact us to let us know.  We also advised that he is welcome to call with any additional questions or concerns in the future.  Given current concerns for patient exposure during the COVID-19 pandemic, this encounter was conducted via telephone. The patient was notified in advance and was offered a MyChart meeting to allow for face to face communication but unfortunately reported that he did not have the appropriate resources/technology to support such a visit and instead preferred to proceed with telephone consult. The patient has given verbal consent for this type of  encounter. The time spent during this encounter was 60 minutes. The attendants for this meeting include Tyler Pita MD, Ashlyn Bruning PA-C, [place], and patient, Randy Hansen and his fiance. During the encounter, Tyler Pita MD, Ashlyn Bruning PA-C, and scribe, Wilburn Mylar were located at [facility].  Patient,  Randy Hansen and his fiance were located at home.    Nicholos Johns, PA-C    Tyler Pita, MD  [facility] Direct Dial: [phone]  Fax: [phone] [email or web address]  Skype  [email or web address]  This document serves as a record of services personally performed by Tyler Pita, MD and Freeman Caldron, PA-C. It was created on their behalf by Wilburn Mylar, a trained medical scribe. The creation of this record is based on the scribe's personal observations and the provider's statements to them. This document has been checked and approved by the attending provider.

## 2019-12-23 ENCOUNTER — Encounter: Payer: Self-pay | Admitting: Medical Oncology

## 2019-12-23 NOTE — Progress Notes (Signed)
Attempted to each patient by cell phone and home number but neither with an answer or voicemail. Will follow up at a later date

## 2019-12-29 ENCOUNTER — Telehealth: Payer: Self-pay | Admitting: *Deleted

## 2019-12-29 ENCOUNTER — Telehealth: Payer: Self-pay | Admitting: Radiation Oncology

## 2019-12-29 NOTE — Telephone Encounter (Signed)
Received voicemail message from patient concerned that "[facility] referred him to [place] and he hasn't heard anything back." Patient was consulted by Dr. Tammi Klippel on 12/14/2019. Patient decided to pursue 5.5 weeks of external beam radiation therapy per Dr. Johny Shears notes. The next step in the patient's continuum of care should be fiducial markers and spaceoar. Routed message to Romie Jumper and requested she reach out to the patient at [phone].

## 2019-12-29 NOTE — Telephone Encounter (Signed)
Called patient to update, lvm for a return call 

## 2020-01-05 ENCOUNTER — Other Ambulatory Visit: Payer: Self-pay | Admitting: Urology

## 2020-01-06 ENCOUNTER — Other Ambulatory Visit: Payer: Self-pay | Admitting: Urology

## 2020-01-06 DIAGNOSIS — C61 Malignant neoplasm of prostate: Secondary | ICD-10-CM

## 2020-02-03 ENCOUNTER — Encounter (HOSPITAL_BASED_OUTPATIENT_CLINIC_OR_DEPARTMENT_OTHER): Payer: Self-pay | Admitting: Urology

## 2020-02-03 ENCOUNTER — Encounter: Payer: Self-pay | Admitting: *Deleted

## 2020-02-03 ENCOUNTER — Other Ambulatory Visit: Payer: Self-pay

## 2020-02-03 NOTE — Progress Notes (Addendum)
ADDENDUM:  Chart reviewed by anesthesia ,Konrad Felix PA and Dr Annye Asa MDA,  Stated have pt take his bp med and inform if high dos procedure may be cancelled.  Told Janett Billow pt does not have any bp medication at home , pt stated he had taken any since 07/ 2020 but was told of possible cancellation procedure. (refer to Janett Billow progress note 02-10-2020)   Spoke w/ via phone for pre-op interview--- Pt Lab needs dos----  Istat and EKG           Lab results------ no COVID test ------ 02-08-2020 @ [id number] Arrive at ------- 1230 NPO after ------ [place]  Medications to take morning of surgery ----- NONE Diabetic medication ----- does not take meds Patient Special Instructions ----- pt verbalized understanding if bp high and/ or blood sugar high anesthesia may cancel procedure Pre-Op special Istructions ----- n/a Patient verbalized understanding of instructions that were given at this phone interview. Patient denies shortness of breath, chest pain, fever, cough a this phone interview.  Anesthesia Review:   Pt had not taken any medication for htn / pre-diabetes since 07/ 2020.  Chart to be reviewed by Konrad Felix PA.  PCP: Molli Barrows FNP (lov 07-12-2019 epic)  Chest x-ray : 09-18-2018 EKG : 10-01-2017 epic Echo :  no Cardiac Cath : no Sleep Study/ CPAP : NO Fasting Blood Sugar :      / Checks Blood Sugar -- times a day:  N/A Blood Thinner/ Instructions /Last Dose: no ASA / Instructions/ Last Dose :  no

## 2020-02-08 ENCOUNTER — Other Ambulatory Visit (HOSPITAL_COMMUNITY)
Admission: RE | Admit: 2020-02-08 | Discharge: 2020-02-08 | Disposition: A | Discharge status: Home / Self Care | Payer: HRSA Program | Source: Ambulatory Visit | Attending: Urology | Admitting: Urology

## 2020-02-08 DIAGNOSIS — Z01812 Encounter for preprocedural laboratory examination: Secondary | ICD-10-CM | POA: Insufficient documentation

## 2020-02-08 DIAGNOSIS — Z20822 Contact with and (suspected) exposure to covid-19: Secondary | ICD-10-CM | POA: Diagnosis not present

## 2020-02-08 LAB — SARS CORONAVIRUS 2 (TAT 6-24 HRS): SARS Coronavirus 2: NEGATIVE

## 2020-02-10 NOTE — Progress Notes (Addendum)
Anesthesia Chart Review   Case: [id number] Date/Time: 02/11/20 1400   Procedures:      TRANSRECTAL ULTRASOUND (N/A ) - ONLY NEED 30 MIN FOR ALL PROCEDURES     GOLD SEED IMPLANT (N/A )     SPACE OAR INSTILLATION (N/A )   Anesthesia type: General   Pre-op diagnosis: PROSTATE CANCER   Location: [facility] OR ROOM 4. / [place]   Surgeons: Ceasar Mons, MD      DISCUSSION:59 y.o. current some day smoker (20 pack years) with h/o HTN, prostate cancer scheduled for above procedure 02/11/20 with Dr. Harrell Gave Lovena Neighbours.   Pt reports to PAT nurse he has discontinued HTN medications.  Last seen by PCP 07/12/2019.  Per OV note pt is noncompliant with HTN treatment.  BP at this visit 161/113.  OV 07/28/2018 BP reading 152/108.  Per Urology note 12/23/2019 BP 122/94.  Discussed with Dr. Glennon Mac.  Pt advised to take HTN medications prior to surgery. Will evaluate DOS.  Pt aware if elevated BP morning of surgery case can be cancelled.    VS: Ht 5\' 11"  (1.803 m)   Wt 113.4 kg   BMI 34.87 kg/m   PROVIDERS: Scot Jun, FNP is PCP    LABS: labs DOS (all labs ordered are listed, but only abnormal results are displayed)  Labs Reviewed - No data to display   IMAGES:   EKG: 10/02/2017 Rate 105 bpm  Sinus tachycardia  Left anterior fascicular block   CV:  Past Medical History:  Diagnosis Date  . Adrenal adenoma, right 2018  . Essential hypertension    dx by pcp 02-22-2019 per note in epic was given medication    (02-03-2020  asked pt about medication for bp and stated he stopped taking long time age , he does not have any problems now,  his bp wsa high "only because I was incarcerated I could not exercise and gained weight")  . Hyperlipidemia    (02-03-2020  pt denies )  . Hyperplasia of prostate with lower urinary tract symptoms (LUTS)   . Lipoma    left hip  . Pre-diabetes    dx 02-22-2019 by pcp note in epic was given metformin;  per next note 07/ 2020 pt  had stopped taking metformin   (02-03-2020  pt denies having pre-diabetes and denied ever taking medication)  . Prostate cancer [facility]) urologist--- dr winter/  oncologsit--- dr Tammi Klippel   dx [phone]  Stage T1c,  Gleason 3+4    Past Surgical History:  Procedure Laterality Date  . NO PAST SURGERIES    . PROSTATE BIOPSY      MEDICATIONS: No current facility-administered medications for this encounter.   . Terbinafine HCl (LAMISIL AT EX)     Konrad Felix, PA-C [facility] Pre-Surgical Testing [phone] 02/10/20  1:15 PM

## 2020-02-11 ENCOUNTER — Encounter (HOSPITAL_BASED_OUTPATIENT_CLINIC_OR_DEPARTMENT_OTHER): Payer: Self-pay | Admitting: Urology

## 2020-02-11 ENCOUNTER — Other Ambulatory Visit: Payer: Self-pay

## 2020-02-11 ENCOUNTER — Ambulatory Visit (HOSPITAL_BASED_OUTPATIENT_CLINIC_OR_DEPARTMENT_OTHER)
Admission: RE | Admit: 2020-02-11 | Discharge: 2020-02-11 | Disposition: A | Discharge status: Home / Self Care | Payer: Self-pay | Attending: Urology | Admitting: Urology

## 2020-02-11 ENCOUNTER — Ambulatory Visit (HOSPITAL_BASED_OUTPATIENT_CLINIC_OR_DEPARTMENT_OTHER): Payer: Self-pay | Admitting: Physician Assistant

## 2020-02-11 ENCOUNTER — Encounter (HOSPITAL_BASED_OUTPATIENT_CLINIC_OR_DEPARTMENT_OTHER)
Admission: RE | Disposition: A | Discharge status: Home / Self Care | Payer: Self-pay | Source: Home / Self Care | Attending: Urology

## 2020-02-11 DIAGNOSIS — I10 Essential (primary) hypertension: Secondary | ICD-10-CM | POA: Insufficient documentation

## 2020-02-11 DIAGNOSIS — R972 Elevated prostate specific antigen [PSA]: Secondary | ICD-10-CM

## 2020-02-11 DIAGNOSIS — C61 Malignant neoplasm of prostate: Secondary | ICD-10-CM | POA: Insufficient documentation

## 2020-02-11 DIAGNOSIS — F17210 Nicotine dependence, cigarettes, uncomplicated: Secondary | ICD-10-CM | POA: Insufficient documentation

## 2020-02-11 DIAGNOSIS — Z8249 Family history of ischemic heart disease and other diseases of the circulatory system: Secondary | ICD-10-CM | POA: Insufficient documentation

## 2020-02-11 HISTORY — PX: TRANSRECTAL ULTRASOUND: SHX5146

## 2020-02-11 HISTORY — PX: SPACE OAR INSTILLATION: SHX6769

## 2020-02-11 HISTORY — DX: Hyperlipidemia, unspecified: E78.5

## 2020-02-11 HISTORY — PX: GOLD SEED IMPLANT: SHX6343

## 2020-02-11 HISTORY — DX: Essential (primary) hypertension: I10

## 2020-02-11 HISTORY — DX: Benign prostatic hyperplasia with lower urinary tract symptoms: N40.1

## 2020-02-11 HISTORY — DX: Benign lipomatous neoplasm, unspecified: D17.9

## 2020-02-11 HISTORY — DX: Prediabetes: R73.03

## 2020-02-11 LAB — GLUCOSE, CAPILLARY: Glucose-Capillary: 116 mg/dL — ABNORMAL HIGH (ref 70–99)

## 2020-02-11 SURGERY — ULTRASOUND, RECTAL APPROACH
Anesthesia: General | Site: Rectum

## 2020-02-11 MED ORDER — FENTANYL CITRATE (PF) 100 MCG/2ML IJ SOLN
25.0000 ug | INTRAMUSCULAR | Status: DC | PRN
  Administered 2020-02-11: 25 ug via INTRAVENOUS

## 2020-02-11 MED ORDER — FENTANYL CITRATE (PF) 100 MCG/2ML IJ SOLN
INTRAMUSCULAR | Status: AC
  Filled 2020-02-11: qty 2

## 2020-02-11 MED ORDER — OXYCODONE HCL 5 MG PO TABS
ORAL_TABLET | ORAL | Status: AC
  Filled 2020-02-11: qty 1

## 2020-02-11 MED ORDER — PROPOFOL 10 MG/ML IV BOLUS
INTRAVENOUS | Status: AC
  Filled 2020-02-11: qty 20

## 2020-02-11 MED ORDER — MIDAZOLAM HCL 2 MG/2ML IJ SOLN
INTRAMUSCULAR | Status: DC | PRN
  Administered 2020-02-11: 2 mg via INTRAVENOUS

## 2020-02-11 MED ORDER — LACTATED RINGERS IV SOLN: INTRAVENOUS | Status: DC

## 2020-02-11 MED ORDER — DEXAMETHASONE SODIUM PHOSPHATE 10 MG/ML IJ SOLN
INTRAMUSCULAR | Status: DC | PRN
  Administered 2020-02-11: 10 mg via INTRAVENOUS

## 2020-02-11 MED ORDER — ONDANSETRON HCL 4 MG/2ML IJ SOLN
INTRAMUSCULAR | Status: AC
  Filled 2020-02-11: qty 2

## 2020-02-11 MED ORDER — CIPROFLOXACIN IN D5W 400 MG/200ML IV SOLN
INTRAVENOUS | Status: AC
  Filled 2020-02-11: qty 200

## 2020-02-11 MED ORDER — CIPROFLOXACIN IN D5W 400 MG/200ML IV SOLN
400.0000 mg | Freq: Once | INTRAVENOUS | Status: AC
  Administered 2020-02-11: 400 mg via INTRAVENOUS

## 2020-02-11 MED ORDER — FENTANYL CITRATE (PF) 100 MCG/2ML IJ SOLN
INTRAMUSCULAR | Status: DC | PRN
  Administered 2020-02-11 (×2): 50 ug via INTRAVENOUS

## 2020-02-11 MED ORDER — OXYCODONE HCL 5 MG PO TABS
5.0000 mg | ORAL_TABLET | Freq: Once | ORAL | Status: AC | PRN
  Administered 2020-02-11: 5 mg via ORAL

## 2020-02-11 MED ORDER — PROPOFOL 500 MG/50ML IV EMUL
INTRAVENOUS | Status: DC | PRN
  Administered 2020-02-11: 200 mg via INTRAVENOUS

## 2020-02-11 MED ORDER — ONDANSETRON 4 MG PO TBDP
ORAL_TABLET | ORAL | Status: AC
  Filled 2020-02-11: qty 1

## 2020-02-11 MED ORDER — DEXAMETHASONE SODIUM PHOSPHATE 10 MG/ML IJ SOLN
INTRAMUSCULAR | Status: AC
  Filled 2020-02-11: qty 1

## 2020-02-11 MED ORDER — MIDAZOLAM HCL 2 MG/2ML IJ SOLN
INTRAMUSCULAR | Status: AC
  Filled 2020-02-11: qty 2

## 2020-02-11 MED ORDER — ONDANSETRON 4 MG PO TBDP
4.0000 mg | ORAL_TABLET | Freq: Once | ORAL | Status: AC
  Administered 2020-02-11: 4 mg via ORAL

## 2020-02-11 MED ORDER — LIDOCAINE 2% (20 MG/ML) 5 ML SYRINGE
INTRAMUSCULAR | Status: AC
  Filled 2020-02-11: qty 5

## 2020-02-11 MED ORDER — FLEET ENEMA 7-19 GM/118ML RE ENEM
1.0000 | ENEMA | Freq: Once | RECTAL | Status: AC
  Administered 2020-02-11: 1 via RECTAL

## 2020-02-11 MED ORDER — OXYCODONE HCL 5 MG/5ML PO SOLN: 5.0000 mg | Freq: Once | ORAL | Status: AC | PRN

## 2020-02-11 MED ORDER — LIDOCAINE 2% (20 MG/ML) 5 ML SYRINGE
INTRAMUSCULAR | Status: DC | PRN
  Administered 2020-02-11: 80 mg via INTRAVENOUS

## 2020-02-11 MED ORDER — TRAMADOL HCL 50 MG PO TABS: 50.0000 mg | ORAL_TABLET | Freq: Four times a day (QID) | ORAL | 0 refills | Status: DC | PRN

## 2020-02-11 MED ORDER — ONDANSETRON HCL 4 MG/2ML IJ SOLN
INTRAMUSCULAR | Status: DC | PRN
  Administered 2020-02-11: 4 mg via INTRAVENOUS

## 2020-02-11 MED FILL — traMADol HCL 50 MG TABS: 50 | 2 days supply | Qty: 10 | Fill #0

## 2020-02-11 MED FILL — SILDENAFIL CITRATE 100 MG T: 100 | 30 days supply | Qty: 10 | Fill #0

## 2020-02-11 SURGICAL SUPPLY — 15 items
COVER BACK TABLE 60X90IN (DRAPES) ×3 IMPLANT
DRSG TEGADERM 4X4.75 (GAUZE/BANDAGES/DRESSINGS) ×3 IMPLANT
DRSG TEGADERM 8X12 (GAUZE/BANDAGES/DRESSINGS) ×6 IMPLANT
GAUZE SPONGE 4X4 12PLY STRL LF (GAUZE/BANDAGES/DRESSINGS) ×3 IMPLANT
GLOVE BIO SURGEON STRL SZ7.5 (GLOVE) ×6 IMPLANT
GLOVE SURG ORTHO 8.5 STRL (GLOVE) ×6 IMPLANT
GOWN STRL REUS W/TWL XL LVL3 (GOWN DISPOSABLE) ×3 IMPLANT
IMPL SPACEOAR SYSTEM 10ML (Spacer) ×2 IMPLANT
IMPLANT SPACEOAR SYSTEM 10ML (Spacer) ×3 IMPLANT
KIT TURNOVER CYSTO (KITS) ×3 IMPLANT
MARKER GOLD PRELOAD 1.2X3 (Urological Implant) ×2 IMPLANT
SEED GOLD PRELOAD 1.2X3 (Urological Implant) ×3 IMPLANT
SURGILUBE 2OZ TUBE FLIPTOP (MISCELLANEOUS) ×3 IMPLANT
TRAY CYSTO PACK (CUSTOM PROCEDURE TRAY) ×3 IMPLANT
UNDERPAD 30X30 (UNDERPADS AND DIAPERS) ×6 IMPLANT

## 2020-02-11 NOTE — H&P (Signed)
Urology Preoperative H&P   Chief Complaint: Prostate cancer  History of Present Illness: Randy Hansen is a 59 y.o. male with a history of T1c Gleason 3+4 prostate cancer that was diagnosed on 10/28/2019.  After careful consideration, the patient has elected to proceed with external beam radiation therapy as primary treatment of his intermediate risk prostate cancer.  He is here today for gold seed fiducial marker and SpaceOAR placement.  He continues to take tamsulosin once daily and reports an adequate force of stream.  He feels like he is emptying his bladder well with minimal urgency and frequency.  He denies interval UTIs, dysuria or hematuria.  Last PSA- 7.15 (10/2019), 4.1, %free= 9.5 (02/22/19), 4.4 (01/2017)-- The patient was referred to [facility] in 2018, but he did not come to his appointment.  Biopsy Date: 10/28/19  TNM stage: T1c  Gleason score: 3+4=7  Left: G6 (5%--Left MB), G6 (10%--Left MA)  Right: G3+4 (30%--Right LA)  Prostate volume: 46.8   Past Medical History:  Diagnosis Date  . Adrenal adenoma, right 2018  . Essential hypertension    dx by pcp 02-22-2019 per note in epic was given medication    (02-03-2020  asked pt about medication for bp and stated he stopped taking long time age , he does not have any problems now,  his bp wsa high "only because I was incarcerated I could not exercise and gained weight")  . Hyperlipidemia    (02-03-2020  pt denies )  . Hyperplasia of prostate with lower urinary tract symptoms (LUTS)   . Lipoma    left hip  . Pre-diabetes    dx 02-22-2019 by pcp note in epic was given metformin;  per next note 07/ 2020 pt had stopped taking metformin   (02-03-2020  pt denies having pre-diabetes and denied ever taking medication)  . Prostate cancer [facility]) urologist--- dr Karron Goens/  oncologsit--- dr Tammi Klippel   dx [phone]  Stage T1c,  Gleason 3+4    Past Surgical History:  Procedure Laterality Date  . NO PAST SURGERIES    . PROSTATE BIOPSY       Allergies: No Known Allergies  Family History  Problem Relation Age of Onset  . Hypertension Mother   . Breast cancer Mother   . Hypertension Father   . Hypertension Maternal Grandmother   . Prostate cancer Neg Hx   . Colon cancer Neg Hx   . Pancreatic cancer Neg Hx     Social History:  reports that he has been smoking cigarettes. He has a 20.00 pack-year smoking history. He has never used smokeless tobacco. He reports current alcohol use of about 7.0 - 14.0 standard drinks of alcohol per week. He reports that he does not use drugs.  ROS: A complete review of systems was performed.  All systems are negative except for pertinent findings as noted.  Physical Exam:  Vital signs in last 24 hours: Temp:  [98.4 F (36.9 C)] 98.4 F (36.9 C) (05/21 1313) Pulse Rate:  [102] 102 (05/21 1313) Resp:  [18] 18 (05/21 1313) BP: (153)/(99) 153/99 (05/21 1313) SpO2:  [97 %] 97 % (05/21 1313) Weight:  [114.9 kg] 114.9 kg (05/21 1313) Constitutional:  Alert and oriented, No acute distress Cardiovascular: Regular rate and rhythm, No JVD Respiratory: Normal respiratory effort, Lungs clear bilaterally GI: Abdomen is soft, nontender, nondistended, no abdominal masses GU: No CVA tenderness Lymphatic: No lymphadenopathy Neurologic: Grossly intact, no focal deficits Psychiatric: Normal mood and affect  Laboratory Data:  No  results for input(s): WBC, HGB, HCT, PLT in the last 72 hours.  No results for input(s): NA, K, CL, GLUCOSE, BUN, CALCIUM, CREATININE in the last 72 hours.  Invalid input(s): CO3   No results found for this or any previous visit (from the past 24 hour(s)). Recent Results (from the past 240 hour(s))  SARS CORONAVIRUS 2 (TAT 6-24 HRS) Nasopharyngeal Nasopharyngeal Swab     Status: None   Collection Time: 02/08/20 12:03 PM   Specimen: Nasopharyngeal Swab  Result Value Ref Range Status   SARS Coronavirus 2 NEGATIVE NEGATIVE Final    Comment: (NOTE) SARS-CoV-2 target  nucleic acids are NOT DETECTED. The SARS-CoV-2 RNA is generally detectable in upper and lower respiratory specimens during the acute phase of infection. Negative results do not preclude SARS-CoV-2 infection, do not rule out co-infections with other pathogens, and should not be used as the sole basis for treatment or other patient management decisions. Negative results must be combined with clinical observations, patient history, and epidemiological information. The expected result is Negative. Fact Sheet for Patients: SugarRoll.be Fact Sheet for Healthcare Providers: https://www.woods-mathews.com/ This test is not yet approved or cleared by the [place] FDA and  has been authorized for detection and/or diagnosis of SARS-CoV-2 by FDA under an Emergency Use Authorization (EUA). This EUA will remain  in effect (meaning this test can be used) for the duration of the COVID-19 declaration under Section 56 4(b)(1) of the Act, 21 U.S.C. section 360bbb-3(b)(1), unless the authorization is terminated or revoked sooner. Performed at [facility], [place] [address] [place], [place] [address]     Renal Function: No results for input(s): CREATININE in the last 168 hours. CrCl cannot be calculated (Patient's most recent lab result is older than the maximum 21 days allowed.).  Radiologic Imaging: No results found.  I independently reviewed the above imaging studies.  Assessment and Plan Randy Hansen is a 59 y.o. male with grade 2 prostate cancer with plans for primary treatment via external beam radiation therapy with Dr. Tammi Klippel  The risk, benefits and alternatives of transrectal ultrasound-guided gold seed fiducial marker and space oar placement was discussed with the patient.  Risks include, but are not limited to, bleeding, hematuria, blood per rectum, blood in the ejaculate, local and/or urinary tract infection along with the inherent  risk of general anesthesia.  He voices understanding and wishes to proceed.  Ellison Hughs, MD 02/11/2020, 1:16 PM  [facility] Pager: [phone]

## 2020-02-11 NOTE — Anesthesia Procedure Notes (Signed)
Procedure Name: LMA Insertion Date/Time: 02/11/2020 1:50 PM Performed by: Gerald Leitz, CRNA Pre-anesthesia Checklist: Patient identified, Patient being monitored, Timeout performed, Emergency Drugs available and Suction available Patient Re-evaluated:Patient Re-evaluated prior to induction Oxygen Delivery Method: Circle system utilized Preoxygenation: Pre-oxygenation with 100% oxygen Induction Type: IV induction Ventilation: Mask ventilation without difficulty LMA: LMA inserted LMA Size: 5.0 Tube type: Oral Number of attempts: 1 Placement Confirmation: positive ETCO2 and breath sounds checked- equal and bilateral Tube secured with: Tape Dental Injury: Teeth and Oropharynx as per pre-operative assessment

## 2020-02-11 NOTE — Op Note (Signed)
Operative Note   Preoperative diagnosis:  1.    Gleason 3+4=7 prostate cancer   Postoperative diagnosis: 1.  Same    Procedure(s): 1.  Transrectal ultrasound guided prostatic gold seed fiducial marker and Space OAR placement   Surgeon: Ellison Hughs, MD   Assistants:  None    Anesthesia:  MAC   Complications:  None   EBL:  <5 mL   Specimens: 1. None   Drains/Catheters: 1.  None   Intraoperative findings:   1. Fiducial markers were placed at the prostatic base, mid-gland and apex.  SpaceOAR was placed with good separation of the prostate and rectum.    Indication: Mr. Randy Hansen is a 59 y.o. male with grade 2 prostate cancer.  He has elected to proceed with external beam radiation therapy as primary treatment.  He has been consented for the above procedures, voices understanding and wishes to proceed.    Description of procedure:   After informed consent the patient was brought to the major OR, placed on the table and administered general anesthesia. He was then moved to the modified lithotomy position with his perineum perpendicular to the floor. His perineum and genitalia were then sterilely prepped. An official timeout was then performed.    Real time transrectal ultrasonography was used visualize the prostate.  Gold seed fiducial markers were then placed transperineally at the prostatic base, mid gland and apex.   I then proceeded with placement of SpaceOAR by introducing a needle with the bevel angled inferiorly approximately 2 cm superior to the anus. This was angled downward and under direct ultrasound was placed within the space between the prostatic capsule and rectum. This was confirmed with a small amount of sterile saline injected and this was performed under direct ultrasound. I then attached the SpaceOAR to the needle and injected this in the space between the prostate and rectum with good placement noted.  The patient tolerated the procedure well and was  transferred to the postanesthesia in stable condition.   Plan: Follow-up in 4 months with PSA

## 2020-02-11 NOTE — Transfer of Care (Signed)
Immediate Anesthesia Transfer of Care Note  Patient: Randy Hansen  Procedure(s) Performed: Procedure(s) with comments: TRANSRECTAL ULTRASOUND (N/A) - ONLY NEED 30 MIN FOR ALL PROCEDURES GOLD SEED IMPLANT (N/A) SPACE OAR INSTILLATION (N/A)  Patient Location: PACU  Anesthesia Type:General  Level of Consciousness: Alert, Awake, Oriented  Airway & Oxygen Therapy: Patient Spontanous Breathing  Post-op Assessment: Report given to RN  Post vital signs: Reviewed and stable  Last Vitals:  Vitals:   02/11/20 1413 02/11/20 1415  BP: (!) 142/101 127/82  Pulse: (!) 103 (!) 113  Resp: 14 19  Temp: 36.8 C   SpO2: [id number]    Complications: No apparent anesthesia complications

## 2020-02-11 NOTE — Anesthesia Postprocedure Evaluation (Signed)
Anesthesia Post Note  Patient: Randy Hansen  Procedure(s) Performed: TRANSRECTAL ULTRASOUND (N/A Rectum) GOLD SEED IMPLANT (N/A Prostate) SPACE OAR INSTILLATION (N/A Prostate)     Patient location during evaluation: PACU Anesthesia Type: General Level of consciousness: awake and alert Pain management: pain level controlled Vital Signs Assessment: post-procedure vital signs reviewed and stable Respiratory status: spontaneous breathing, nonlabored ventilation, respiratory function stable and patient connected to nasal cannula oxygen Cardiovascular status: blood pressure returned to baseline and stable Postop Assessment: no apparent nausea or vomiting Anesthetic complications: no    Last Vitals:  Vitals:   02/11/20 1457 02/11/20 1500  BP:  (!) 145/86  Pulse: 86 93  Resp: 16 (!) 21  Temp:    SpO2: 96% 93%    Last Pain:  Vitals:   02/11/20 1457  TempSrc:   PainSc: 4                  [name] S

## 2020-02-11 NOTE — Anesthesia Preprocedure Evaluation (Signed)
Anesthesia Evaluation  Patient identified by MRN, date of birth, ID band Patient awake    Reviewed: Allergy & Precautions, NPO status , Patient's Chart, lab work & pertinent test results  Airway Mallampati: II  TM Distance: >3 FB Neck ROM: Full    Dental no notable dental hx.    Pulmonary neg pulmonary ROS, Current Smoker,    Pulmonary exam normal breath sounds clear to auscultation       Cardiovascular hypertension, Normal cardiovascular exam Rhythm:Regular Rate:Normal     Neuro/Psych negative neurological ROS  negative psych ROS   GI/Hepatic negative GI ROS, Neg liver ROS,   Endo/Other  negative endocrine ROS  Renal/GU negative Renal ROS  negative genitourinary   Musculoskeletal negative musculoskeletal ROS (+)   Abdominal   Peds negative pediatric ROS (+)  Hematology negative hematology ROS (+)   Anesthesia Other Findings   Reproductive/Obstetrics negative OB ROS                             Anesthesia Physical Anesthesia Plan  ASA: II  Anesthesia Plan: General   Post-op Pain Management:    Induction: Intravenous  PONV Risk Score and Plan: Ondansetron and Treatment may vary due to age or medical condition  Airway Management Planned: LMA  Additional Equipment:   Intra-op Plan:   Post-operative Plan: Extubation in OR  Informed Consent: I have reviewed the patients History and Physical, chart, labs and discussed the procedure including the risks, benefits and alternatives for the proposed anesthesia with the patient or authorized representative who has indicated his/her understanding and acceptance.     Dental advisory given  Plan Discussed with: CRNA and Surgeon  Anesthesia Plan Comments:         Anesthesia Quick Evaluation

## 2020-02-11 NOTE — Discharge Instructions (Signed)

## 2020-02-14 ENCOUNTER — Telehealth: Payer: Self-pay | Admitting: *Deleted

## 2020-02-14 NOTE — Telephone Encounter (Signed)
CALLED PATIENT TO REMIND OF SIM AND MRI FOR 02-15-20, SPOKE WITH PATIENT AND HE IS AWARE OF THESE APPTS.

## 2020-02-14 NOTE — Telephone Encounter (Signed)
Called patient  to remind of sim and MRI for 02-15-20, lvm for a return call

## 2020-02-15 ENCOUNTER — Ambulatory Visit (HOSPITAL_COMMUNITY): Payer: Self-pay

## 2020-02-15 ENCOUNTER — Ambulatory Visit: Payer: Self-pay | Admitting: Radiation Oncology

## 2020-02-16 ENCOUNTER — Telehealth: Payer: Self-pay | Admitting: *Deleted

## 2020-02-16 NOTE — Telephone Encounter (Signed)
CALLED PATIENT TO INFORM OF SIM AND MRI BEING ON 02-29-20, SPOKE WITH PATIENT AND HE IS AWARE OF THESE APPTS.

## 2020-02-23 ENCOUNTER — Ambulatory Visit: Payer: Self-pay | Admitting: Radiation Oncology

## 2020-02-28 ENCOUNTER — Telehealth: Payer: Self-pay | Admitting: *Deleted

## 2020-02-28 NOTE — Telephone Encounter (Signed)
Called patient to remind of sim and MRI for 02-29-20, spoke with patient and he is aware of these appts.

## 2020-02-29 ENCOUNTER — Ambulatory Visit: Payer: Self-pay | Admitting: Radiation Oncology

## 2020-02-29 ENCOUNTER — Encounter: Payer: Self-pay | Admitting: Medical Oncology

## 2020-02-29 ENCOUNTER — Ambulatory Visit (HOSPITAL_COMMUNITY)
Admission: RE | Admit: 2020-02-29 | Discharge: 2020-02-29 | Disposition: A | Discharge status: Home / Self Care | Payer: Medicaid Other | Source: Ambulatory Visit | Attending: Urology | Admitting: Urology

## 2020-02-29 ENCOUNTER — Ambulatory Visit
Admission: RE | Admit: 2020-02-29 | Discharge: 2020-02-29 | Disposition: A | Discharge status: Home / Self Care | Payer: Medicaid Other | Source: Ambulatory Visit | Attending: Radiation Oncology | Admitting: Radiation Oncology

## 2020-02-29 ENCOUNTER — Other Ambulatory Visit: Payer: Self-pay

## 2020-02-29 DIAGNOSIS — C61 Malignant neoplasm of prostate: Secondary | ICD-10-CM | POA: Diagnosis present

## 2020-02-29 DIAGNOSIS — Z510 Encounter for antineoplastic radiation therapy: Secondary | ICD-10-CM | POA: Diagnosis not present

## 2020-02-29 NOTE — Progress Notes (Signed)
Introduced myself to Randy Hansen as the prostate nurse navigator and discussed my role. He is here for CT simulation today. He had lots of questions about procedure today and radiation.Questions were addressed. He is having trouble with his car and Adrian provided him transportation today. I asked him to let us know if he will need transportation to daily treatment. If so, I can arrange. He is scheduled for MRI today at 4 pm. I called to see if he can be worked in, so he will not have to come back this afternoon. They will work him in after [place]. I walked him to radiology and provided him with telephone number for transportation. I instructed him to call after MRI  for transportation home. He was very appreciative of the care he received today. I asked him to  call with questions or concerns. He voiced understanding. He asked about adding people to his HIPPA list. I provided him with form to complete and bring back when he starts radiation.

## 2020-03-02 ENCOUNTER — Telehealth (INDEPENDENT_AMBULATORY_CARE_PROVIDER_SITE_OTHER): Payer: Medicaid Other | Admitting: Internal Medicine

## 2020-03-02 ENCOUNTER — Encounter: Payer: Self-pay | Admitting: Internal Medicine

## 2020-03-02 DIAGNOSIS — M545 Low back pain, unspecified: Secondary | ICD-10-CM

## 2020-03-02 DIAGNOSIS — C61 Malignant neoplasm of prostate: Secondary | ICD-10-CM

## 2020-03-02 DIAGNOSIS — D179 Benign lipomatous neoplasm, unspecified: Secondary | ICD-10-CM

## 2020-03-02 DIAGNOSIS — K047 Periapical abscess without sinus: Secondary | ICD-10-CM | POA: Diagnosis not present

## 2020-03-02 DIAGNOSIS — G8929 Other chronic pain: Secondary | ICD-10-CM

## 2020-03-02 MED ORDER — CYCLOBENZAPRINE HCL 10 MG PO TABS: 10.0000 mg | ORAL_TABLET | Freq: Three times a day (TID) | ORAL | 0 refills | Status: DC | PRN

## 2020-03-02 MED ORDER — AMOXICILLIN-POT CLAVULANATE 875-125 MG PO TABS: 1.0000 | ORAL_TABLET | Freq: Two times a day (BID) | ORAL | 0 refills | Status: DC

## 2020-03-02 NOTE — Progress Notes (Signed)
  Radiation Oncology         [phone] ________________________________  Name: Randy Hansen MRN: [id number]  Date: 02/29/2020  DOB: 20-May-1961  SIMULATION AND TREATMENT PLANNING NOTE    ICD-10-CM   1. Malignant neoplasm of prostate ([place])  C61     DIAGNOSIS:  59 y.o. gentleman with Stage T1c adenocarcinoma of the prostate with Gleason score of 3+4, and PSA of 7.15  NARRATIVE:  The patient was brought to the [facility].  Identity was confirmed.  All relevant records and images related to the planned course of therapy were reviewed.  The patient freely provided informed written consent to proceed with treatment after reviewing the details related to the planned course of therapy. The consent form was witnessed and verified by the simulation staff.  Then, the patient was set-up in a stable reproducible supine position for radiation therapy.  A vacuum lock pillow device was custom fabricated to position his legs in a reproducible immobilized position.  Then, I performed a urethrogram under sterile conditions to identify the prostatic apex.  CT images were obtained.  Surface markings were placed.  The CT images were loaded into the planning software.  Then the prostate target and avoidance structures including the rectum, bladder, bowel and hips were contoured.  Treatment planning then occurred.  The radiation prescription was entered and confirmed.  A total of one complex treatment devices was fabricated. I have requested : Intensity Modulated Radiotherapy (IMRT) is medically necessary for this case for the following reason:  Rectal sparing.Marland Kitchen  PLAN:  The patient will receive 70 Gy in 28 fractions.  ________________________________  Sheral Apley Tammi Klippel, M.D.

## 2020-03-02 NOTE — Progress Notes (Signed)
Virtual Visit via Telephone Note  I connected with Randy Hansen, on 03/02/2020 at 1:38 PM by telephone due to the COVID-19 pandemic and verified that I am speaking with the correct person using two identifiers.   Consent: I discussed the limitations, risks, security and privacy concerns of performing an evaluation and management service by telephone and the availability of in person appointments. I also discussed with the patient that there may be a patient responsible charge related to this service. The patient expressed understanding and agreed to proceed.   Location of Patient: Home   Location of Provider: Clinic    Persons participating in Telemedicine visit: Randy Hansen [facility] Dr. Juleen China      History of Present Illness: Patient has a visit to follow up on grade 2 prostate cancer. He was initially referred to Urology for elevated PSA. He had biopsies done with 3 tissue samples coming back with cancerous cells. On 5/21, he had SpaceOAR placed. His radiation treatment starts on 6/17.   Also requests antibiotics due to gum infection. This has occurred in the past. He is trying to get his teeth pulled but dentist will not do this if he has active infection.   Wants to check on status of referral to Gen Surgery for lipoma removal.   Has low back pain. Says he thinks it might be his kidney. Has been chronic. Worsens when he bends over or lifts heavy objects. Does not significantly interfere with his day to day life. Denies dysuria, urinary frequency, or urgency. Afebrile.    Past Medical History:  Diagnosis Date   Adrenal adenoma, right 2018   Essential hypertension    dx by pcp 02-22-2019 per note in epic was given medication    (02-03-2020  asked pt about medication for bp and stated he stopped taking long time age , he does not have any problems now,  his bp wsa high "only because I was incarcerated I could not exercise and gained weight")   Hyperlipidemia     (02-03-2020  pt denies )   Hyperplasia of prostate with lower urinary tract symptoms (LUTS)    Lipoma    left hip   Pre-diabetes    dx 02-22-2019 by pcp note in epic was given metformin;  per next note 07/ 2020 pt had stopped taking metformin   (02-03-2020  pt denies having pre-diabetes and denied ever taking medication)   Prostate cancer [facility]) urologist--- dr winter/  oncologsit--- dr Tammi Klippel   dx [phone]  Stage T1c,  Gleason 3+4   No Known Allergies  Current Outpatient Medications on File Prior to Visit  Medication Sig Dispense Refill   [DISCONTINUED] metFORMIN (GLUCOPHAGE) 500 MG tablet Take 1 tablet (500 mg total) by mouth 2 (two) times daily with a meal. (Patient not taking: Reported on 04/19/2019) 180 tablet 3   No current facility-administered medications on file prior to visit.    Observations/Objective: NAD. Speaking clearly.  Work of breathing normal.  Alert and oriented. Mood appropriate.   Assessment and Plan: 1. Malignant neoplasm of prostate [facility]) To start radiation therapy.   2. Lipoma, unspecified site Patient went to Gen Surgery in [place] on 11/5 but no showed his scheduled removal on 11/18. Reports transportation is a barrier. Advised that [place] does not take [facility]. Will have [name] reach out to [name], RN to discuss transportation options. Advised that I would defer to Gen Surg and his prostate cancer treatment team regarding if they would advise  removal while he is actively undergoing radiation therapy.   3. Chronic bilateral low back pain without sciatica Does not sound consistent with nephrolithiasis or pyelonephritis given lack of intense pain, bilateral presentation, chronicity, and lack of urinary/systemic symptoms. Will treat for presumed muscle spasm.  - cyclobenzaprine (FLEXERIL) 10 MG tablet; Take 1 tablet (10 mg total) by mouth 3 (three) times daily as needed for muscle spasms.  Dispense: 30 tablet; Refill: 0  4.  Dental infection - amoxicillin-clavulanate (AUGMENTIN) 875-125 MG tablet; Take 1 tablet by mouth 2 (two) times daily.  Dispense: 20 tablet; Refill: 0   Follow Up Instructions: PRN and for chronic medical care    I discussed the assessment and treatment plan with the patient. The patient was provided an opportunity to ask questions and all were answered. The patient agreed with the plan and demonstrated an understanding of the instructions.   The patient was advised to call back or seek an in-person evaluation if the symptoms worsen or if the condition fails to improve as anticipated.     I provided 22 minutes total of non-face-to-face time during this encounter including median intraservice time, reviewing previous notes, investigations, ordering medications, medical decision making, coordinating care and patient verbalized understanding at the end of the visit.    Phill Myron, D.O. Primary Care at [facility]  03/02/2020, 1:38 PM

## 2020-03-03 DIAGNOSIS — Z510 Encounter for antineoplastic radiation therapy: Secondary | ICD-10-CM | POA: Diagnosis not present

## 2020-03-07 ENCOUNTER — Telehealth: Payer: Self-pay | Admitting: Internal Medicine

## 2020-03-07 MED FILL — traMADol HCL 50 MG TABS: 50 | 2 days supply | Qty: 10 | Fill #0

## 2020-03-07 MED FILL — SILDENAFIL CITRATE 100 MG T: 100 | 30 days supply | Qty: 10 | Fill #0

## 2020-03-07 NOTE — Telephone Encounter (Signed)
error 

## 2020-03-09 ENCOUNTER — Ambulatory Visit
Admission: RE | Admit: 2020-03-09 | Discharge: 2020-03-09 | Disposition: A | Discharge status: Home / Self Care | Payer: Medicaid Other | Source: Ambulatory Visit | Attending: Radiation Oncology | Admitting: Radiation Oncology

## 2020-03-09 ENCOUNTER — Other Ambulatory Visit: Payer: Self-pay

## 2020-03-09 DIAGNOSIS — Z510 Encounter for antineoplastic radiation therapy: Secondary | ICD-10-CM | POA: Diagnosis not present

## 2020-03-10 ENCOUNTER — Other Ambulatory Visit: Payer: Self-pay

## 2020-03-10 ENCOUNTER — Ambulatory Visit
Admission: RE | Admit: 2020-03-10 | Discharge: 2020-03-10 | Disposition: A | Discharge status: Home / Self Care | Payer: Medicaid Other | Source: Ambulatory Visit | Attending: Radiation Oncology | Admitting: Radiation Oncology

## 2020-03-10 DIAGNOSIS — Z510 Encounter for antineoplastic radiation therapy: Secondary | ICD-10-CM | POA: Diagnosis not present

## 2020-03-13 ENCOUNTER — Encounter: Payer: Self-pay | Admitting: Medical Oncology

## 2020-03-13 ENCOUNTER — Encounter: Payer: Self-pay | Admitting: Radiation Oncology

## 2020-03-13 ENCOUNTER — Ambulatory Visit: Admission: RE | Admit: 2020-03-13 | Payer: Medicaid Other | Source: Ambulatory Visit

## 2020-03-13 NOTE — Progress Notes (Signed)
Left voicemail for patient to return my call regarding [employer]

## 2020-03-14 ENCOUNTER — Ambulatory Visit
Admission: RE | Admit: 2020-03-14 | Discharge: 2020-03-14 | Disposition: A | Discharge status: Home / Self Care | Payer: Medicaid Other | Source: Ambulatory Visit | Attending: Radiation Oncology | Admitting: Radiation Oncology

## 2020-03-14 ENCOUNTER — Other Ambulatory Visit: Payer: Self-pay

## 2020-03-14 ENCOUNTER — Encounter: Payer: Self-pay | Admitting: Medical Oncology

## 2020-03-14 DIAGNOSIS — Z510 Encounter for antineoplastic radiation therapy: Secondary | ICD-10-CM | POA: Diagnosis not present

## 2020-03-14 NOTE — Progress Notes (Signed)
Left message to inquire about missed radiation appointment 6/21. I reminded him to be here today, no later than 4 pm for 4:15 treatment. I asked him to call me to confirm or to discuss any barriers.

## 2020-03-15 ENCOUNTER — Encounter: Payer: Self-pay | Admitting: General Practice

## 2020-03-15 ENCOUNTER — Other Ambulatory Visit: Payer: Self-pay

## 2020-03-15 ENCOUNTER — Ambulatory Visit
Admission: RE | Admit: 2020-03-15 | Discharge: 2020-03-15 | Disposition: A | Discharge status: Home / Self Care | Payer: Medicaid Other | Source: Ambulatory Visit | Attending: Radiation Oncology | Admitting: Radiation Oncology

## 2020-03-15 DIAGNOSIS — Z510 Encounter for antineoplastic radiation therapy: Secondary | ICD-10-CM | POA: Diagnosis not present

## 2020-03-15 NOTE — Progress Notes (Signed)
[place] Initial Psychosocial Assessment Clinical Social Work  Clinical Social Work contacted by phone to assess psychosocial, emotional, mental health, and spiritual needs of the patient.   Barriers to care/review of distress screen:  Transportation:  Do you anticipate any problems getting to appointments?  Do you have someone who can help run errands for you if you need it?  Uses [facility] transportation, does not have his own transportation.  Appreciates [place] assistance.  Says that he has difficulty getting access to fresh food and doing laundry due to lack of transportation.   Help at home:  What is your living situation (alone, family, other)?  If you are physically unable to care for yourself, who would you call on to help you? Living at [facility] apartments w significant other, 57 year old son and significant others children.  Was living at [facility] (advocate is Glennis Brink - [phone]) prior to pandemic.  Is with program through [facility] and is in a "program" through the Y.  Will continue to receive services through the Y for an undetermined period of time.   Support system:  What does your support system look like?  Who would you call on if you needed some kind of practical help?  What if you needed someone to talk to for emotional support? "I need help with a lot of things, food, clothes, shoes."  My "significant other  suffers from arthritis, has a lot of pain, they keep saying to her that they cannot help her unless she discloses information about the father of her baby."  Concerned that significant other cannot get any help w Medicaid or disability due to this barrier.  Spent many years in prison, released several years ago.  Sought care at [facility] for PTSD after release.   Finances:  Are you concerned about finances.  Considering returning to work?  If not, applying for disability?  Gets unemployment once/week, "I dont know how long that will last."  Has been qualified for [employer].  Most  recently worked at [employer], has done multiple other jobs Oncologist, Proofreader, Architect.  Has started application for disability.  Is working w Health visitor at [employer], applied on his own. Was denied in the past for either SSI or SSDI - now has interview on July 26 at [facility].   Both patient and significant other get Food Stamps.  Says program pays their rent, but they pay utility bills.  They have past due bills that can be submitted to [employer].  Has [employer] for some healthcare services.  Needs dental care and glasses.  Gets food from [employer] but this has been limited by lack of transportation.  Wants washer/dryer as his apartment has a hook up - currently uses laundromat but this is difficult due to lack of transportation.  Can use pharmacy at [place] for all medications -reports these are available to him at nominal cost, verified that this is listed as his preferred pharmacy in [place] is your understanding of where you are with your cancer? Its cause?  Your treatment plan and what happens next?  Newly diagnosed w Stage 2 prostate cancer, currently in radiation treatment.  Was found during routine care from PCP.  Had not had either PSA or colonoscopy prior to this.  Lives w significant other and several children in apartment in [place].    What are your worries for the future as you begin treatment for cancer?  Hopes "they  can get it all out."  Hopeful for cure.  Concerned about finances and his inability to work, concerned that unemployment will run out soon.  What are your hopes and priorities during your treatment? What is important to you? What are your goals for your care?  Curative intent for cancer treatment, has other chronic medical issues, sees his PCP at [facility].   CSW Summary:  Patient and family psychosocial functioning including strengths, limitations, and coping skills: Newly diagnosed w Stage II  prostate cancer, will have 6 week course of radiation w curative intent.  Was living in family shelter at [facility] until recently - was placed w local apartment and continues to receive support from caseworker w this program.  Program pays for rent and some other expenses as needed - he is responsible for utilities.  Lives w significant other and several children including his 57 year old son.  Concerned about significant other's inability to receive government benefits which would help her w treatment for pain associated w arthritis.  In process of filing for SSA disability - has paperwork to complete and return prior to his scheduled interview in late July.  Encouraged him to include all past medical providers as SSA will need to request medical records for review in process of determining his eligibility.  Does work w [facility] where he has the [employer] and access to [employer].  Needs dental work and glasses - has found it difficult to return to [facility] clinic (cannot get appointment at this time) and has no source for glasses.  Appreciative of transportation to/friom [place] appointments.  Finds lack of transportation difficult in terms of access fresh food and doing laundry for family.  Declined help from [facility] food pantry as options available are all canned/shelf stable items - states they need fresh foods.   Used to access [employer] but lack of transport has made this difficult.  Asks CSW to contact his caseworker Glennis Brink in order to get more information about his needs.    Identifications of barriers to care: no transportation, no income, no insurance, Chiropractor of community resources:  [employer] support program, Advertising account executive, IT trainer (application in process)  Holiday representative follow up needed: Yes.    Will contact by phone after speaking w his caseworker as requested  Edwyna Shell, [place] Worker Phone:   [phone]

## 2020-03-16 ENCOUNTER — Telehealth: Payer: Self-pay | Admitting: General Practice

## 2020-03-16 ENCOUNTER — Encounter: Payer: Self-pay | Admitting: Medical Oncology

## 2020-03-16 ENCOUNTER — Other Ambulatory Visit: Payer: Self-pay

## 2020-03-16 ENCOUNTER — Telehealth: Payer: Self-pay

## 2020-03-16 ENCOUNTER — Ambulatory Visit
Admission: RE | Admit: 2020-03-16 | Discharge: 2020-03-16 | Disposition: A | Discharge status: Home / Self Care | Payer: Medicaid Other | Source: Ambulatory Visit | Attending: Radiation Oncology | Admitting: Radiation Oncology

## 2020-03-16 DIAGNOSIS — Z510 Encounter for antineoplastic radiation therapy: Secondary | ICD-10-CM | POA: Diagnosis not present

## 2020-03-16 NOTE — Telephone Encounter (Addendum)
[place] CSW Progress Notes  At patient's request, spoke w Glennis Brink, housing case manager w [facility].  He is part of their Rapid Rehousing program - a transition from shelter to permanent housing support.  Program has paid a decreasing amount of rent for past several months. They request documentation of his current medical condition and appointment schedule  - they can pay full rent for the time he is in active treatment.  ROI prepared - patient will be asked to sign this so appropriate documentation can be provided from our HIM department.  Conveyed patients concerns about food insecurity, need for glasses and dental work, inability to obtain needed prescriptions.  Per case manager, patient and family receive Food Stamps monthly.  [facility] will provide food box weekly.  Patient declined food bag from [facility] pantry as it only contained canned goods and shelf stable items - he is requesting meat and fresh food which neither [facility] nor we are unable to provide.  Asked the [facility] provide weekly supplemental food gift card.  [facility] will coordinate access to vision and dental care through the [employer] process and [employer].  [facility] is aware of their lack of transportation, appreciate [place] transport for [facility] appointments.  CSW will speak w Etter Sjogren RN CM re access to prescriptions at [facility].  As patient has PCP at [facility], he is able to use the [facility] pharmacy at low/no cost.  The issue may be lack of transport to pick up prescriptions - if so, case manager is willing to take patient to pharmacy to fill these prescriptions.    Edwyna Shell, LCSW Clinical Social Worker Phone:  [phone] Cell:  [phone]

## 2020-03-16 NOTE — Progress Notes (Signed)
Asked patient to sign release of information so we can  release records to [facility], in order to comply w program guidelines for payment of rent while in treatment. I faxed consult note and appointment calendar to T. Dumas@ [phone]. Patient also asked if he could obtain another gas card. Per Vincente [employer], he received one 6/22, and they are only issued weekly. He voiced understanding.

## 2020-03-16 NOTE — Telephone Encounter (Signed)
This CM spoke to Edwyna Shell, LCSW regarding patient's needs.  Informed her that [facility] pharmacy offers patient assistance programs for uninsured patient's that are not able to afford their medications. She explained that the  patient was concerned about affording BP medications if any are prescribed.    Informed Webb Silversmith that this CM spoke to [name], CMA/PCE about patient's transportation issues to out of county medical appointments.  The patient may be eligible for [place].  He needs to complete the eligibility application.

## 2020-03-16 NOTE — Telephone Encounter (Deleted)
[facility] CSW Progress Notes   

## 2020-03-16 NOTE — Telephone Encounter (Signed)
[place] CSW Progress Notes  Call from Etter Sjogren, RN CM w [place].  Clinic will work with patient to obtain any medications prescribed by [facility] can use the [facility].  Edwyna Shell, LCSW Clinical Social Worker Phone:  [phone] Cell:  [phone]

## 2020-03-16 NOTE — Telephone Encounter (Signed)
[place] CSW Progress Notes  Call to patient to explain need for release of information to [facility] in order to comply w program guidelines for payment of rent while in treatment.  No answer, no VM.  Nurse navigator Terrace Arabia will assist w obtaining this documentation.  Also advised patient to bring documentation to [place] for help available under [employer].  Edwyna Shell, LCSW Clinical Social Worker Phone:  [phone] Cell:  [phone]

## 2020-03-17 ENCOUNTER — Ambulatory Visit
Admission: RE | Admit: 2020-03-17 | Discharge: 2020-03-17 | Disposition: A | Discharge status: Home / Self Care | Payer: Medicaid Other | Source: Ambulatory Visit | Attending: Radiation Oncology | Admitting: Radiation Oncology

## 2020-03-17 ENCOUNTER — Other Ambulatory Visit: Payer: Self-pay

## 2020-03-17 ENCOUNTER — Encounter: Payer: Self-pay | Admitting: Radiation Oncology

## 2020-03-17 DIAGNOSIS — Z510 Encounter for antineoplastic radiation therapy: Secondary | ICD-10-CM | POA: Diagnosis not present

## 2020-03-17 NOTE — Progress Notes (Signed)
Pt was approved for [employer] on 06/22 and was given a gas card that day.  Requested for patient to being in any bills that he needed assistance with and he declined and stated he only wanted cards. I explained to him he can only get 1 per week and he voiced understanding.

## 2020-03-20 ENCOUNTER — Ambulatory Visit: Payer: Medicaid Other

## 2020-03-21 ENCOUNTER — Other Ambulatory Visit: Payer: Self-pay

## 2020-03-21 ENCOUNTER — Ambulatory Visit
Admission: RE | Admit: 2020-03-21 | Discharge: 2020-03-21 | Disposition: A | Discharge status: Home / Self Care | Payer: Medicaid Other | Source: Ambulatory Visit | Attending: Radiation Oncology | Admitting: Radiation Oncology

## 2020-03-21 DIAGNOSIS — Z510 Encounter for antineoplastic radiation therapy: Secondary | ICD-10-CM | POA: Diagnosis not present

## 2020-03-22 ENCOUNTER — Ambulatory Visit
Admission: RE | Admit: 2020-03-22 | Discharge: 2020-03-22 | Disposition: A | Discharge status: Home / Self Care | Payer: Medicaid Other | Source: Ambulatory Visit | Attending: Radiation Oncology | Admitting: Radiation Oncology

## 2020-03-22 ENCOUNTER — Other Ambulatory Visit: Payer: Self-pay

## 2020-03-22 DIAGNOSIS — Z510 Encounter for antineoplastic radiation therapy: Secondary | ICD-10-CM | POA: Diagnosis not present

## 2020-03-23 ENCOUNTER — Ambulatory Visit
Admission: RE | Admit: 2020-03-23 | Discharge: 2020-03-23 | Disposition: A | Discharge status: Home / Self Care | Payer: Medicaid Other | Source: Ambulatory Visit | Attending: Radiation Oncology | Admitting: Radiation Oncology

## 2020-03-23 ENCOUNTER — Other Ambulatory Visit: Payer: Self-pay

## 2020-03-23 DIAGNOSIS — C61 Malignant neoplasm of prostate: Secondary | ICD-10-CM | POA: Diagnosis present

## 2020-03-23 DIAGNOSIS — Z510 Encounter for antineoplastic radiation therapy: Secondary | ICD-10-CM | POA: Insufficient documentation

## 2020-03-24 ENCOUNTER — Ambulatory Visit: Payer: Medicaid Other

## 2020-03-28 ENCOUNTER — Ambulatory Visit: Payer: Medicaid Other

## 2020-03-29 ENCOUNTER — Other Ambulatory Visit: Payer: Self-pay

## 2020-03-29 ENCOUNTER — Ambulatory Visit
Admission: RE | Admit: 2020-03-29 | Discharge: 2020-03-29 | Disposition: A | Discharge status: Home / Self Care | Payer: Medicaid Other | Source: Ambulatory Visit | Attending: Radiation Oncology | Admitting: Radiation Oncology

## 2020-03-29 DIAGNOSIS — Z510 Encounter for antineoplastic radiation therapy: Secondary | ICD-10-CM | POA: Diagnosis not present

## 2020-03-30 ENCOUNTER — Ambulatory Visit: Payer: Medicaid Other

## 2020-03-31 ENCOUNTER — Encounter: Payer: Self-pay | Admitting: General Practice

## 2020-03-31 ENCOUNTER — Ambulatory Visit
Admission: RE | Admit: 2020-03-31 | Discharge: 2020-03-31 | Disposition: A | Discharge status: Home / Self Care | Payer: Medicaid Other | Source: Ambulatory Visit | Attending: Radiation Oncology | Admitting: Radiation Oncology

## 2020-03-31 DIAGNOSIS — Z510 Encounter for antineoplastic radiation therapy: Secondary | ICD-10-CM | POA: Diagnosis not present

## 2020-03-31 NOTE — Progress Notes (Signed)
[place] CSW Progress Notes  Call from Glennis Brink, [place] housing case Freight forwarder.  Patient is expressing distress over not being able to afford food.  States current disbursement from [employer] is not enough to help provide food for his family of 5.  Margorie John requests that he be given resources to help if possible.  Enrolled patient in [place], left initial $50 disbursement at reception desk for him to get when he comes for his appt today.  Stressed that he is able to receive this once/week while he is in treatment, with a total of $200 possible.  Margorie John also questioned whether [place] could provide transport to upcoming appt at [place] for cyst excision.  Per RN CM Eden Lathe, this is not related to cancer and has been arranged by PCP.  Advised that PCP office should be contacted re this appt.  Edwyna Shell, LCSW Clinical Social Worker Phone:  [phone] Cell:  [phone]

## 2020-04-03 ENCOUNTER — Ambulatory Visit: Payer: Medicaid Other

## 2020-04-04 ENCOUNTER — Encounter: Payer: Self-pay | Admitting: General Practice

## 2020-04-04 ENCOUNTER — Ambulatory Visit
Admission: RE | Admit: 2020-04-04 | Discharge: 2020-04-04 | Disposition: A | Discharge status: Home / Self Care | Payer: Medicaid Other | Source: Ambulatory Visit | Attending: Radiation Oncology | Admitting: Radiation Oncology

## 2020-04-04 ENCOUNTER — Other Ambulatory Visit: Payer: Self-pay

## 2020-04-04 DIAGNOSIS — Z510 Encounter for antineoplastic radiation therapy: Secondary | ICD-10-CM | POA: Diagnosis not present

## 2020-04-04 NOTE — Progress Notes (Signed)
[place] CSW Progress Notes  Patient made an unscheduled visits to [employer], requesting additional [employer].  He received his initial disbursement on Friday July 9th.   Patient was not scheduled to receive this second disbursement until Friday July 16 - however, Admin Assist provided cards to him today and informed him that he would not be able to  request additional gift cards until Friday July 23 and Friday July 30.  Patient can receive a total of 4 [employer] for food and gas.  He has received two (July 9 and July 13).  He can receive his last two disbursements on July 23 and July 30.  He can only receive one disbursement/week, to a total of 4 disbursements from this fund.  Patient has been made aware of these guidelines.  Edwyna Shell, LCSW Clinical Social Worker Phone:  [phone]

## 2020-04-05 ENCOUNTER — Other Ambulatory Visit: Payer: Self-pay

## 2020-04-05 ENCOUNTER — Telehealth: Payer: Self-pay | Admitting: Radiation Oncology

## 2020-04-05 ENCOUNTER — Encounter: Payer: Self-pay | Admitting: Radiation Oncology

## 2020-04-05 ENCOUNTER — Ambulatory Visit
Admission: RE | Admit: 2020-04-05 | Discharge: 2020-04-05 | Disposition: A | Discharge status: Home / Self Care | Payer: Medicaid Other | Source: Ambulatory Visit | Attending: Radiation Oncology | Admitting: Radiation Oncology

## 2020-04-05 DIAGNOSIS — Z510 Encounter for antineoplastic radiation therapy: Secondary | ICD-10-CM | POA: Diagnosis not present

## 2020-04-05 NOTE — Progress Notes (Signed)
Opened in error

## 2020-04-05 NOTE — Telephone Encounter (Signed)
Phoned patient. Home number is invalid. No answer on cell. Left message on cell phone explaining that [facility] says an appointment is required with them before any bp medications can be issued. Provided him the number to the clinic of [phone]. Provided him my direct number for future questions. Will circle back reference this matter with the patient during PUT encounter Friday (7/16).

## 2020-04-06 ENCOUNTER — Telehealth: Payer: Self-pay | Admitting: Radiation Oncology

## 2020-04-06 ENCOUNTER — Ambulatory Visit: Payer: Medicaid Other

## 2020-04-06 NOTE — Telephone Encounter (Signed)
Received voicemail message from patient cancelling his radiation treatment for today. Patient verbalized in voicemail message his intent to return for treatment tomorrow. Informed Merrilee Seashore, RT on L1 of this finding.

## 2020-04-07 ENCOUNTER — Ambulatory Visit: Payer: Medicaid Other

## 2020-04-10 ENCOUNTER — Ambulatory Visit: Payer: Medicaid Other

## 2020-04-10 ENCOUNTER — Telehealth: Payer: Self-pay | Admitting: Radiation Oncology

## 2020-04-10 NOTE — Telephone Encounter (Signed)
Received voicemail message from patient at 1543 cancelling radiation therapy for today. Patient states, "I will be there tomorrow." Informed Elmyra Ricks, RT on L1 of this finding.

## 2020-04-11 ENCOUNTER — Other Ambulatory Visit: Payer: Self-pay

## 2020-04-11 ENCOUNTER — Ambulatory Visit
Admission: RE | Admit: 2020-04-11 | Discharge: 2020-04-11 | Disposition: A | Discharge status: Home / Self Care | Payer: Medicaid Other | Source: Ambulatory Visit | Attending: Radiation Oncology | Admitting: Radiation Oncology

## 2020-04-11 DIAGNOSIS — Z510 Encounter for antineoplastic radiation therapy: Secondary | ICD-10-CM | POA: Diagnosis not present

## 2020-04-12 ENCOUNTER — Telehealth: Payer: Self-pay | Admitting: Radiation Oncology

## 2020-04-12 ENCOUNTER — Ambulatory Visit: Payer: Medicaid Other

## 2020-04-12 NOTE — Progress Notes (Signed)
Received patient in the clinic following radiation treatment with the purpose of inquiring about status. Noted patient has missed several radiation treatments in recent days. Patient reports if he has no one to watch his son he has to cancel treatment. BP elevated again today. Patient confirms he received my voicemail and has scheduled an appointment with his PCP about his elevated BP. Patient questioned if [place] could facilitate the ride to his PCP. Gently reminded the patient of the funding he receives each week from [facility] and encouraged him to allocate some of those funds for transportation to PCP. Patient verbalized understanding. Printed and reviewed appointment calendar with patient. Patient verbalized understanding. Patient discharged home. No distress noted. Patient exited clinic ambulatory with a steady gait.

## 2020-04-12 NOTE — Telephone Encounter (Signed)
Received voicemail message from patient today cancelling his treatment appointment. Patient verbalized in the voicemail his intent to return tomorrow for his next treatment. Attempted to inform L1 of this finding. Unable to reach a therapist this RN cancelled the appointment.

## 2020-04-13 ENCOUNTER — Ambulatory Visit: Payer: Self-pay | Admitting: General Surgery

## 2020-04-13 ENCOUNTER — Ambulatory Visit: Payer: Medicaid Other

## 2020-04-14 ENCOUNTER — Ambulatory Visit
Admission: RE | Admit: 2020-04-14 | Discharge: 2020-04-14 | Disposition: A | Discharge status: Home / Self Care | Payer: Medicaid Other | Source: Ambulatory Visit | Attending: Radiation Oncology | Admitting: Radiation Oncology

## 2020-04-14 DIAGNOSIS — Z510 Encounter for antineoplastic radiation therapy: Secondary | ICD-10-CM | POA: Diagnosis not present

## 2020-04-17 ENCOUNTER — Ambulatory Visit: Payer: Self-pay | Admitting: Internal Medicine

## 2020-04-17 ENCOUNTER — Ambulatory Visit: Payer: Medicaid Other

## 2020-04-17 ENCOUNTER — Telehealth: Payer: Self-pay | Admitting: Radiation Oncology

## 2020-04-17 NOTE — Telephone Encounter (Signed)
Patient left voicemail message wishing to cancel radiation therapy for today. Patient states, "Nothing is wrong I just don't have someone to watch my son." Informed Merrilee Seashore, RT on L1 and Cira Rue, RN of these findings.

## 2020-04-18 ENCOUNTER — Ambulatory Visit: Payer: Medicaid Other

## 2020-04-18 ENCOUNTER — Other Ambulatory Visit: Payer: Self-pay

## 2020-04-18 ENCOUNTER — Ambulatory Visit
Admission: RE | Admit: 2020-04-18 | Discharge: 2020-04-18 | Disposition: A | Discharge status: Home / Self Care | Payer: Medicaid Other | Source: Ambulatory Visit | Attending: Radiation Oncology | Admitting: Radiation Oncology

## 2020-04-18 ENCOUNTER — Ambulatory Visit: Payer: Self-pay | Admitting: General Surgery

## 2020-04-18 ENCOUNTER — Telehealth: Payer: Self-pay | Admitting: Radiation Oncology

## 2020-04-18 DIAGNOSIS — Z510 Encounter for antineoplastic radiation therapy: Secondary | ICD-10-CM | POA: Diagnosis not present

## 2020-04-18 NOTE — Telephone Encounter (Signed)
Received call from patient requesting to coming in for treatment at 2 pm today. Informed Elmyra Ricks, RT on L1 of this request and she will call the patient.   Also, patient explained on the voicemail that Roberta at the [facility] needs a copy of his medical records. Patient provided verbal consent for these records to be sent. Will get patient to sign medical release form tomorrow when he presents for treatment then records will be faxed.   Roberta's phone [phone] ext [phone]. Fax number [phone]. Case number [id number]

## 2020-04-19 ENCOUNTER — Ambulatory Visit: Payer: Medicaid Other

## 2020-04-19 ENCOUNTER — Encounter: Payer: Self-pay | Admitting: *Deleted

## 2020-04-19 NOTE — Progress Notes (Signed)
Randy Hansen   Holiday representative contacted patient at home to offer support and assess for needs.  Patient has had to cancel appointments due to childcare.  CSW and patient discussed transportation.  Patient is currently using the [facility] transportation program, and reports no issues.  Patient is very appreciative of the transportation program.  CSW and patient also discussed childcare needs, as patients has previously reported childcare as a barrier to treatment.  Patient stated he has had childcare issues in the past.  CSW offered to reach out to Glennis Brink at the [facility] to see what/if childcare resources were available.  Patient stated he would reach out to Ms. Margorie John, and requested CSW not call at this time.  Patient also stated his sister was now available and would be able to assist with childcare needs.  Patient stated he did not see childcare being a reason for missed appointments in the future.  Patient expressed financial concerns and stated he is receiving multiple medical bills.  Patient has the orange card and is connected to resources through community health and wellness.  Patient stated he applied for Medicaid approximately 6 months ago and was denied.  CSW encouraged patient to reapply with new medical information.  Patient stated he could contact his caseworker at [place], and update CSW as needed.  CSW provided contact information and encouraged patient to cal with questions or concerns.   Randy Hansen, MSW, LCSW, OSW-C Clinical Social Worker [facility] [phone]

## 2020-04-20 ENCOUNTER — Ambulatory Visit: Payer: Self-pay | Admitting: General Surgery

## 2020-04-20 ENCOUNTER — Encounter: Payer: Self-pay | Admitting: General Practice

## 2020-04-20 ENCOUNTER — Other Ambulatory Visit: Payer: Self-pay

## 2020-04-20 ENCOUNTER — Telehealth (INDEPENDENT_AMBULATORY_CARE_PROVIDER_SITE_OTHER): Payer: Self-pay | Admitting: Internal Medicine

## 2020-04-20 ENCOUNTER — Other Ambulatory Visit: Payer: Self-pay | Admitting: Radiation Oncology

## 2020-04-20 ENCOUNTER — Ambulatory Visit
Admission: RE | Admit: 2020-04-20 | Discharge: 2020-04-20 | Disposition: A | Discharge status: Home / Self Care | Payer: Medicaid Other | Source: Ambulatory Visit | Attending: Radiation Oncology | Admitting: Radiation Oncology

## 2020-04-20 ENCOUNTER — Ambulatory Visit: Payer: Medicaid Other

## 2020-04-20 DIAGNOSIS — Z510 Encounter for antineoplastic radiation therapy: Secondary | ICD-10-CM | POA: Diagnosis not present

## 2020-04-20 MED ORDER — SILDENAFIL CITRATE 25 MG PO TABS
100.0000 mg | ORAL_TABLET | Freq: Every day | ORAL | 5 refills | Status: DC | PRN
Start: 2020-04-20 — End: 2020-05-12

## 2020-04-20 MED ORDER — TAMSULOSIN HCL 0.4 MG PO CAPS
0.4000 mg | ORAL_CAPSULE | Freq: Every day | ORAL | 5 refills | Status: DC
Start: 2020-04-20 — End: 2020-05-25

## 2020-04-20 MED FILL — TAMSULOSIN HCL 0.4 MG CAP: 0.4 | 30 days supply | Qty: 30 | Fill #0

## 2020-04-20 MED FILL — SILDENAFIL CITRATE 25 MG TA: 25 | 4 days supply | Qty: 15 | Fill #0

## 2020-04-20 NOTE — Progress Notes (Signed)
CMA [name] was able to reach patient for televisit triage. However, due to IT issues there was a significant delay between her triage and this provider calling patient back for a visit. Called cell x2, left VM x1. Called home x2 with busy signal each time. Will reschedule visit.   Phill Myron, D.O. Primary Care at [facility]  04/25/2020, 3:26 PM

## 2020-04-20 NOTE — Progress Notes (Signed)
[place] Progress Notes  Patient received fourth and final [place] distribution today - he is not eligible for any more funds from this source.  He left a VM stating that he contacted DSS today to inquire about his Medicaid eligibility - he was told that unless he was declared disabled by [employer], he would only receive Medicaid if he met any additional MEdicaid criteria.  His message expressed concern about how he was going to pay his medical bills.  He has the [employer] through [facility] and access services as he is able under this service.  In addition, per Financial Advocate A Rosalita Levan, he has already applied for the [facility] Financial assistance but did not provide the required docments - he can reapply after a 6 month wait period (late August/early September).  A [place], Estate manager/land agent, asked to give him a MEdicaid application as he can apply for Medicaid as often as desired.  Patient is working w Conservation officer, nature, T Pension scheme manager; CSW did not contact this caseworker as he informed CSW [place] yesterday that he did not want [place] contacting her.  CSW tried to call patient and discuss above information, neither number was working. Edwyna Shell, LCSW Clinical Social Worker Phone:  [phone] Cell:  [phone]

## 2020-04-21 ENCOUNTER — Telehealth: Payer: Self-pay | Admitting: General Practice

## 2020-04-21 ENCOUNTER — Ambulatory Visit
Admission: RE | Admit: 2020-04-21 | Discharge: 2020-04-21 | Disposition: A | Discharge status: Home / Self Care | Payer: Medicaid Other | Source: Ambulatory Visit | Attending: Radiation Oncology | Admitting: Radiation Oncology

## 2020-04-21 ENCOUNTER — Ambulatory Visit: Payer: Medicaid Other

## 2020-04-21 DIAGNOSIS — Z510 Encounter for antineoplastic radiation therapy: Secondary | ICD-10-CM | POA: Diagnosis not present

## 2020-04-21 NOTE — Telephone Encounter (Signed)
[place] CSW Progress Notes  Call from patient.  States he has been told he can get more [employer] cards because "I am still in treatment."  Told patient these funds are limited to maximum of $200 and he has received that total amount.  Advised that he needs tto work w his Estate manager/land agent to use his [employer].  Also advised that he can reapply for Medicaid and that he has an appt w [place] to discuss this option.  States he will not be coming to todays radiation appointment because "I am helping my brother with his car."  States he will call and cancel todays appt, CSW stressed the need to complete all treatments as scheduled and on time.  Edwyna Shell, LCSW Clinical Social Worker Phone:  [phone]

## 2020-04-21 NOTE — Telephone Encounter (Signed)
[place] CSW Progress Notes  Second call to patient to address his concerns re finances. Unable to leave VM and no answer.  He has received his last [employer].  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.  Per RN Cm Etter Sjogren at [facility], he can be referred to Legal Aid if he needs help w his Medicaid application.  They will do this and involve C Mos[place], [facility].    Edwyna Shell, LCSW Clinical Social Worker Phone:  [phone] Cell:  [phone]

## 2020-04-24 ENCOUNTER — Ambulatory Visit: Payer: Medicaid Other

## 2020-04-24 ENCOUNTER — Telehealth: Payer: Self-pay

## 2020-04-24 NOTE — Telephone Encounter (Signed)
Call placed to patient regarding his medicaid denial. He said that he was denied again. This CM explained the services that [employer] Aid [facility] [place] provides and noted that they may be able to help him with an appeal.  He was in agreement to placing the referral to Legal Aid of [place]. Referral sent via [place] email to [facility].   Update provided to [employer]

## 2020-04-25 ENCOUNTER — Ambulatory Visit: Payer: Medicaid Other

## 2020-04-25 ENCOUNTER — Ambulatory Visit
Admission: RE | Admit: 2020-04-25 | Discharge: 2020-04-25 | Disposition: A | Discharge status: Home / Self Care | Payer: Medicaid Other | Source: Ambulatory Visit | Attending: Radiation Oncology | Admitting: Radiation Oncology

## 2020-04-25 ENCOUNTER — Encounter: Payer: Self-pay | Admitting: Radiation Oncology

## 2020-04-25 ENCOUNTER — Ambulatory Visit: Payer: Self-pay

## 2020-04-25 DIAGNOSIS — Z510 Encounter for antineoplastic radiation therapy: Secondary | ICD-10-CM | POA: Diagnosis present

## 2020-04-25 DIAGNOSIS — C61 Malignant neoplasm of prostate: Secondary | ICD-10-CM | POA: Insufficient documentation

## 2020-04-25 MED ORDER — AMLODIPINE BESYLATE 10 MG PO TABS: 10.0000 mg | ORAL_TABLET | Freq: Every day | ORAL | 0 refills | Status: DC

## 2020-04-25 MED FILL — AMLODIPINE BESYLATE 10 MG T: 10 | 30 days supply | Qty: 30 | Fill #0

## 2020-04-25 NOTE — Progress Notes (Signed)
Patient signed RELEASE OF INFORMATION form on 04/19/2020 allowing radiation records to be faxed to [place] @ the disability office. Completed form placed in box in nursing to be scanned into the EMR.

## 2020-04-26 ENCOUNTER — Ambulatory Visit: Payer: Medicaid Other

## 2020-04-26 ENCOUNTER — Ambulatory Visit: Payer: Self-pay

## 2020-04-26 ENCOUNTER — Encounter: Payer: Self-pay | Admitting: Internal Medicine

## 2020-04-26 ENCOUNTER — Other Ambulatory Visit: Payer: Self-pay

## 2020-04-26 ENCOUNTER — Ambulatory Visit: Payer: Self-pay | Attending: Internal Medicine

## 2020-04-26 ENCOUNTER — Telehealth (INDEPENDENT_AMBULATORY_CARE_PROVIDER_SITE_OTHER): Payer: Medicaid Other | Admitting: Internal Medicine

## 2020-04-26 DIAGNOSIS — I10 Essential (primary) hypertension: Secondary | ICD-10-CM

## 2020-04-26 DIAGNOSIS — D1779 Benign lipomatous neoplasm of other sites: Secondary | ICD-10-CM

## 2020-04-26 DIAGNOSIS — M545 Low back pain, unspecified: Secondary | ICD-10-CM

## 2020-04-26 DIAGNOSIS — K047 Periapical abscess without sinus: Secondary | ICD-10-CM

## 2020-04-26 DIAGNOSIS — G8929 Other chronic pain: Secondary | ICD-10-CM

## 2020-04-26 MED ORDER — AMOXICILLIN-POT CLAVULANATE 875-125 MG PO TABS: 1.0000 | ORAL_TABLET | Freq: Two times a day (BID) | ORAL | 0 refills | Status: DC

## 2020-04-26 MED FILL — AMOX-CLAV 875-125 MG TABLET: 875-125 | 10 days supply | Qty: 20 | Fill #0

## 2020-04-26 NOTE — Progress Notes (Signed)
Virtual Visit via Telephone Note  I connected with Randy Hansen, on 04/26/2020 at 10:28 AM by telephone due to the COVID-19 pandemic and verified that I am speaking with the correct person using two identifiers.   Consent: I discussed the limitations, risks, security and privacy concerns of performing an evaluation and management service by telephone and the availability of in person appointments. I also discussed with the patient that there may be a patient responsible charge related to this service. The patient expressed understanding and agreed to proceed.   Location of Patient: Home   Location of Provider: Clinic    Persons participating in Telemedicine visit: Randy Hansen [facility] Dr. Juleen China   History of Present Illness: Patient has a visit for concern about low back pain, has been occurring for 8 months to 1 year. Occurs both sides, "straight across". Has pain with flexion. Was diagnosed with prostate cancer in May. Is receiving radiation 5 days per week at [facility] center. Reports he was told that cancer was limited to his prostate and that he does not have metastatic disease. No loss of bladder or bowel continence. No saddle anesthesia. Tried Flexeril trial already and that did not help.    Past Medical History:  Diagnosis Date  . Adrenal adenoma, right 2018  . Cancer ([place])    Phreesia 04/20/2020  . Essential hypertension    dx by pcp 02-22-2019 per note in epic was given medication    (02-03-2020  asked pt about medication for bp and stated he stopped taking long time age , he does not have any problems now,  his bp wsa high "only because I was incarcerated I could not exercise and gained weight")  . Hyperlipidemia    (02-03-2020  pt denies )  . Hyperplasia of prostate with lower urinary tract symptoms (LUTS)   . Lipoma    left hip  . Pre-diabetes    dx 02-22-2019 by pcp note in epic was given metformin;  per next note 07/ 2020 pt had stopped taking  metformin   (02-03-2020  pt denies having pre-diabetes and denied ever taking medication)  . Prostate cancer [facility]) urologist--- dr winter/  oncologsit--- dr Tammi Klippel   dx [phone]  Stage T1c,  Gleason 3+4   No Known Allergies  Current Outpatient Medications on File Prior to Visit  Medication Sig Dispense Refill  . amLODipine (NORVASC) 10 MG tablet Take 1 tablet (10 mg total) by mouth daily. 90 tablet 0  . sildenafil (VIAGRA) 25 MG tablet Take 4 tablets (100 mg total) by mouth daily as needed for erectile dysfunction. 15 tablet 5  . tamsulosin (FLOMAX) 0.4 MG CAPS capsule Take 1 capsule (0.4 mg total) by mouth daily after supper. 30 capsule 5  . [DISCONTINUED] metFORMIN (GLUCOPHAGE) 500 MG tablet Take 1 tablet (500 mg total) by mouth 2 (two) times daily with a meal. (Patient not taking: Reported on 04/19/2019) 180 tablet 3   No current facility-administered medications on file prior to visit.    Observations/Objective: NAD. Speaking clearly.  Work of breathing normal.  Alert and oriented. Mood appropriate.   Assessment and Plan: 1. Essential hypertension Resume Amlodipine due to report of elevated BPs. Asymptomatic. Rx was sent in yesterday.   2. Chronic bilateral low back pain without sciatica Given history of prostate cancer, will obtain imaging. Imaging results will direct next steps.  - DG Lumbar Spine Complete; Future  3. Lipoma of other specified sites - Ambulatory referral to General Surgery  4. Dental infection Unfortunately, patient has been banned from practice that accepts [employer]. Will provide dental resources list when returns in office for imaging. Augmentin Rx provided.  - amoxicillin-clavulanate (AUGMENTIN) 875-125 MG tablet; Take 1 tablet by mouth 2 (two) times daily.  Dispense: 20 tablet; Refill: 0  Follow Up Instructions: Imaging 8/5    I discussed the assessment and treatment plan with the patient. The patient was provided an opportunity to ask questions  and all were answered. The patient agreed with the plan and demonstrated an understanding of the instructions.   The patient was advised to call back or seek an in-person evaluation if the symptoms worsen or if the condition fails to improve as anticipated.     I provided 20 minutes total of non-face-to-face time during this encounter including median intraservice time, reviewing previous notes, investigations, ordering medications, medical decision making, coordinating care and patient verbalized understanding at the end of the visit.    Phill Myron, D.O. Primary Care at [facility]  04/26/2020, 10:28 AM

## 2020-04-27 ENCOUNTER — Ambulatory Visit: Payer: Medicaid Other

## 2020-04-27 ENCOUNTER — Ambulatory Visit (INDEPENDENT_AMBULATORY_CARE_PROVIDER_SITE_OTHER): Payer: Medicaid Other

## 2020-04-27 ENCOUNTER — Ambulatory Visit: Payer: Self-pay | Admitting: General Surgery

## 2020-04-27 DIAGNOSIS — M545 Low back pain, unspecified: Secondary | ICD-10-CM

## 2020-04-27 DIAGNOSIS — G8929 Other chronic pain: Secondary | ICD-10-CM

## 2020-04-27 NOTE — Progress Notes (Signed)
Patient here for lumbar xray.

## 2020-04-28 ENCOUNTER — Ambulatory Visit
Admission: RE | Admit: 2020-04-28 | Discharge: 2020-04-28 | Disposition: A | Discharge status: Home / Self Care | Payer: Medicaid Other | Source: Ambulatory Visit | Attending: Radiation Oncology | Admitting: Radiation Oncology

## 2020-04-28 ENCOUNTER — Other Ambulatory Visit: Payer: Self-pay

## 2020-04-28 ENCOUNTER — Ambulatory Visit: Payer: Medicaid Other

## 2020-04-28 DIAGNOSIS — Z510 Encounter for antineoplastic radiation therapy: Secondary | ICD-10-CM | POA: Diagnosis not present

## 2020-05-01 ENCOUNTER — Ambulatory Visit: Payer: Medicaid Other

## 2020-05-02 ENCOUNTER — Ambulatory Visit: Payer: Self-pay | Admitting: General Surgery

## 2020-05-02 ENCOUNTER — Ambulatory Visit
Admission: RE | Admit: 2020-05-02 | Discharge: 2020-05-02 | Disposition: A | Discharge status: Home / Self Care | Payer: Medicaid Other | Source: Ambulatory Visit | Attending: Radiation Oncology | Admitting: Radiation Oncology

## 2020-05-02 ENCOUNTER — Ambulatory Visit: Payer: Medicaid Other

## 2020-05-02 ENCOUNTER — Other Ambulatory Visit: Payer: Self-pay

## 2020-05-02 ENCOUNTER — Encounter: Payer: Self-pay | Admitting: Licensed Clinical Social Worker

## 2020-05-02 DIAGNOSIS — Z510 Encounter for antineoplastic radiation therapy: Secondary | ICD-10-CM | POA: Diagnosis not present

## 2020-05-02 NOTE — Progress Notes (Signed)
[place] CSW Progress Note  Patient in clinic for radiation tx and requested to see Education officer, museum. Requesting more [employer]. He has already received last disbursement and was notified of this by Pleas Patricia (other CSW's). He will check with financial navigators if he has any cards available still through [employer]. CSW offered bag of food, but patient declined as he is looking for meat, not canned goods.  CSW notified CSWs Webb Silversmith & Vernie Shanks of the above.    Edwinna Areola Benford Asch , LCSW

## 2020-05-03 ENCOUNTER — Telehealth: Payer: Self-pay | Admitting: Radiation Oncology

## 2020-05-03 ENCOUNTER — Ambulatory Visit: Payer: Medicaid Other

## 2020-05-03 NOTE — Telephone Encounter (Signed)
Received voicemail message from patient explaining he plans to be present for radiation treatment today. Also, he explains he was able to pick up and begin taking his bp medications.

## 2020-05-04 ENCOUNTER — Ambulatory Visit: Payer: Medicaid Other

## 2020-05-05 ENCOUNTER — Telehealth: Payer: Self-pay | Admitting: Internal Medicine

## 2020-05-05 ENCOUNTER — Ambulatory Visit: Payer: Medicaid Other

## 2020-05-05 ENCOUNTER — Encounter: Payer: Self-pay | Admitting: General Practice

## 2020-05-05 ENCOUNTER — Encounter: Payer: Self-pay | Admitting: Radiation Oncology

## 2020-05-05 MED FILL — TAMSULOSIN HCL 0.4 MG CAP: 0.4 | 30 days supply | Qty: 30 | Fill #0

## 2020-05-05 MED FILL — AMOX-CLAV 875-125 MG TABLET: 875-125 | 10 days supply | Qty: 20 | Fill #0

## 2020-05-05 MED FILL — SILDENAFIL CITRATE 100 MG T: 100 | 30 days supply | Qty: 10 | Fill #1

## 2020-05-05 NOTE — Progress Notes (Signed)
Patient called and requested a 2nd gas card for the week stating that he wanted to use it to buy his son a birthday cake.   I explained to the patient that we are only allowed to distribute 1 card per week.

## 2020-05-05 NOTE — Telephone Encounter (Signed)
Patent stopped by the office and dropped off 4 papers for the financial counselor. Placed in counselors box.

## 2020-05-05 NOTE — Progress Notes (Signed)
[place] CSW Progress Notes  Call from patient, wants another $50 gift card from [facility].  "I thought that since I am still in treatment, I could continue to get those cards."  Explained that patient has received maximum amount of funds available.  Wife got on phone, stated that "this weekend is my son's 20th birthday, we were wanting to get him a birthday cake and some things for his birthday, we were hoping we could get some more money."  CSW reiterated that patient has received the maximum amount available from [facility] at this time, is not eligible for another distribution.  Practice administrator notified of the request.   Edwyna Shell, LCSW Clinical Social Worker Phone:  [phone]

## 2020-05-08 ENCOUNTER — Ambulatory Visit: Payer: Medicaid Other

## 2020-05-09 ENCOUNTER — Ambulatory Visit
Admission: RE | Admit: 2020-05-09 | Discharge: 2020-05-09 | Disposition: A | Discharge status: Home / Self Care | Payer: Medicaid Other | Source: Ambulatory Visit | Attending: Radiation Oncology | Admitting: Radiation Oncology

## 2020-05-09 ENCOUNTER — Other Ambulatory Visit: Payer: Self-pay

## 2020-05-09 ENCOUNTER — Telehealth: Payer: Self-pay | Admitting: Physician Assistant

## 2020-05-09 ENCOUNTER — Telehealth: Payer: Self-pay | Admitting: Internal Medicine

## 2020-05-09 ENCOUNTER — Ambulatory Visit: Payer: Medicaid Other

## 2020-05-09 DIAGNOSIS — Z510 Encounter for antineoplastic radiation therapy: Secondary | ICD-10-CM | POA: Diagnosis not present

## 2020-05-09 NOTE — Telephone Encounter (Signed)
Called to discuss the homebound Covid-19 vaccination initiative with the patient and/or caregiver.   Message left to call back.  Jeffry Vogelsang PA-C  [facility]     

## 2020-05-09 NOTE — Telephone Encounter (Signed)
Pt was sent a letter from financial dept. Inform them, that the application they submitted was incomplete, since they were missing some documentation at the time of the appointment, Pt need to reschedule and resubmit all new papers and application for CAFA and [place], P.S. old documents has been sent back by mail to the Pt and Pt. need to make a new appt. 

## 2020-05-10 ENCOUNTER — Ambulatory Visit: Payer: Medicaid Other

## 2020-05-11 ENCOUNTER — Ambulatory Visit: Payer: Medicaid Other

## 2020-05-12 ENCOUNTER — Ambulatory Visit: Payer: Medicaid Other

## 2020-05-12 ENCOUNTER — Other Ambulatory Visit: Payer: Self-pay | Admitting: Radiation Oncology

## 2020-05-12 ENCOUNTER — Ambulatory Visit
Admission: RE | Admit: 2020-05-12 | Discharge: 2020-05-12 | Disposition: A | Discharge status: Home / Self Care | Payer: Medicaid Other | Source: Ambulatory Visit | Attending: Radiation Oncology | Admitting: Radiation Oncology

## 2020-05-12 ENCOUNTER — Other Ambulatory Visit: Payer: Self-pay

## 2020-05-12 DIAGNOSIS — Z510 Encounter for antineoplastic radiation therapy: Secondary | ICD-10-CM | POA: Diagnosis not present

## 2020-05-12 MED ORDER — SILDENAFIL CITRATE 25 MG PO TABS: 100.0000 mg | ORAL_TABLET | Freq: Every day | ORAL | 5 refills | Status: DC | PRN

## 2020-05-15 ENCOUNTER — Ambulatory Visit: Payer: Medicaid Other

## 2020-05-16 ENCOUNTER — Ambulatory Visit: Payer: Medicaid Other

## 2020-05-16 ENCOUNTER — Other Ambulatory Visit: Payer: Self-pay

## 2020-05-16 ENCOUNTER — Ambulatory Visit
Admission: RE | Admit: 2020-05-16 | Discharge: 2020-05-16 | Disposition: A | Discharge status: Home / Self Care | Payer: Medicaid Other | Source: Ambulatory Visit | Attending: Radiation Oncology | Admitting: Radiation Oncology

## 2020-05-16 ENCOUNTER — Ambulatory Visit: Payer: Self-pay | Admitting: General Surgery

## 2020-05-16 DIAGNOSIS — Z510 Encounter for antineoplastic radiation therapy: Secondary | ICD-10-CM | POA: Diagnosis not present

## 2020-05-17 ENCOUNTER — Ambulatory Visit: Payer: Medicaid Other

## 2020-05-18 ENCOUNTER — Ambulatory Visit: Payer: Medicaid Other

## 2020-05-19 ENCOUNTER — Ambulatory Visit
Admission: RE | Admit: 2020-05-19 | Discharge: 2020-05-19 | Disposition: A | Discharge status: Home / Self Care | Payer: Medicaid Other | Source: Ambulatory Visit | Attending: Radiation Oncology | Admitting: Radiation Oncology

## 2020-05-19 ENCOUNTER — Ambulatory Visit: Payer: Medicaid Other

## 2020-05-19 DIAGNOSIS — Z510 Encounter for antineoplastic radiation therapy: Secondary | ICD-10-CM | POA: Diagnosis not present

## 2020-05-22 ENCOUNTER — Ambulatory Visit: Payer: Medicaid Other

## 2020-05-23 ENCOUNTER — Ambulatory Visit: Payer: Medicaid Other

## 2020-05-23 ENCOUNTER — Telehealth: Payer: Self-pay | Admitting: Internal Medicine

## 2020-05-23 ENCOUNTER — Ambulatory Visit: Payer: Self-pay | Admitting: General Surgery

## 2020-05-23 NOTE — Telephone Encounter (Signed)
Pt was sent a letter from financial dept. Inform them, that the application they submitted was incomplete, since they were missing some documentation at the time of the appointment, Pt need to reschedule and resubmit all new papers and application for CAFA and [place], P.S. old documents has been sent back by mail to the Pt and Pt. need to make a new appt. 

## 2020-05-24 ENCOUNTER — Ambulatory Visit
Admission: RE | Admit: 2020-05-24 | Discharge: 2020-05-24 | Disposition: A | Discharge status: Home / Self Care | Payer: Medicaid Other | Source: Ambulatory Visit | Attending: Radiation Oncology | Admitting: Radiation Oncology

## 2020-05-24 ENCOUNTER — Ambulatory Visit: Payer: Medicaid Other

## 2020-05-24 DIAGNOSIS — Z510 Encounter for antineoplastic radiation therapy: Secondary | ICD-10-CM | POA: Insufficient documentation

## 2020-05-24 DIAGNOSIS — C61 Malignant neoplasm of prostate: Secondary | ICD-10-CM | POA: Insufficient documentation

## 2020-05-25 ENCOUNTER — Other Ambulatory Visit: Payer: Self-pay

## 2020-05-25 ENCOUNTER — Ambulatory Visit: Payer: Medicaid Other

## 2020-05-25 ENCOUNTER — Telehealth (INDEPENDENT_AMBULATORY_CARE_PROVIDER_SITE_OTHER): Payer: No Payment, Other | Admitting: Psychiatry

## 2020-05-25 ENCOUNTER — Encounter (HOSPITAL_COMMUNITY): Payer: Self-pay | Admitting: Psychiatry

## 2020-05-25 DIAGNOSIS — F4310 Post-traumatic stress disorder, unspecified: Secondary | ICD-10-CM | POA: Insufficient documentation

## 2020-05-25 DIAGNOSIS — F1994 Other psychoactive substance use, unspecified with psychoactive substance-induced mood disorder: Secondary | ICD-10-CM

## 2020-05-25 MED ORDER — PRAZOSIN HCL 1 MG PO CAPS: 1.0000 mg | ORAL_CAPSULE | Freq: Every day | ORAL | 2 refills | Status: DC

## 2020-05-25 MED ORDER — QUETIAPINE FUMARATE 100 MG PO TABS: 100.0000 mg | ORAL_TABLET | Freq: Every day | ORAL | 2 refills | Status: DC

## 2020-05-25 MED FILL — QUETIAPINE FUMARATE 100 MG: 100 | 30 days supply | Qty: 30 | Fill #0

## 2020-05-25 MED FILL — PRAZOSIN 1 MG CAPSULE: 1 | 30 days supply | Qty: 30 | Fill #0
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
## 2020-05-26 ENCOUNTER — Ambulatory Visit: Payer: Medicaid Other

## 2020-05-29 ENCOUNTER — Ambulatory Visit: Payer: Medicaid Other

## 2020-05-30 ENCOUNTER — Ambulatory Visit: Payer: Medicaid Other

## 2020-05-31 ENCOUNTER — Ambulatory Visit: Payer: Medicaid Other

## 2020-05-31 ENCOUNTER — Telehealth: Payer: Self-pay | Admitting: Radiation Oncology

## 2020-05-31 ENCOUNTER — Encounter: Payer: Self-pay | Admitting: Radiation Oncology

## 2020-05-31 NOTE — Telephone Encounter (Signed)
Patient requested a letter detailing his diagnosis and treatment dates. Letter written detailing information requested. Letter mailed to patient's home.

## 2020-06-01 ENCOUNTER — Ambulatory Visit: Payer: Medicaid Other

## 2020-06-02 ENCOUNTER — Other Ambulatory Visit: Payer: Self-pay

## 2020-06-02 ENCOUNTER — Ambulatory Visit: Payer: Medicaid Other

## 2020-06-02 ENCOUNTER — Ambulatory Visit: Admission: RE | Admit: 2020-06-02 | Payer: Medicaid Other | Source: Ambulatory Visit

## 2020-06-05 ENCOUNTER — Ambulatory Visit: Payer: Medicaid Other

## 2020-06-06 ENCOUNTER — Ambulatory Visit
Admission: RE | Admit: 2020-06-06 | Discharge: 2020-06-06 | Disposition: A | Discharge status: Home / Self Care | Payer: Medicaid Other | Source: Ambulatory Visit | Attending: Radiation Oncology | Admitting: Radiation Oncology

## 2020-06-06 ENCOUNTER — Other Ambulatory Visit: Payer: Self-pay | Admitting: Family Medicine

## 2020-06-06 ENCOUNTER — Ambulatory Visit: Payer: Self-pay

## 2020-06-06 DIAGNOSIS — C61 Malignant neoplasm of prostate: Secondary | ICD-10-CM

## 2020-06-06 MED ORDER — AMLODIPINE BESYLATE 10 MG PO TABS: 10.0000 mg | ORAL_TABLET | Freq: Every day | ORAL | 0 refills | Status: DC

## 2020-06-06 MED FILL — AMLODIPINE BESYLATE 10 MG T: 10 | 30 days supply | Qty: 30 | Fill #0

## 2020-06-06 NOTE — Progress Notes (Signed)
Amlodipine refilled per his request. Unable to pick medication up in august

## 2020-06-07 ENCOUNTER — Ambulatory Visit
Admission: RE | Admit: 2020-06-07 | Discharge: 2020-06-07 | Disposition: A | Discharge status: Home / Self Care | Payer: Medicaid Other | Source: Ambulatory Visit | Attending: Radiation Oncology | Admitting: Radiation Oncology

## 2020-06-07 ENCOUNTER — Encounter: Payer: Self-pay | Admitting: Radiation Oncology

## 2020-06-07 ENCOUNTER — Ambulatory Visit: Payer: Medicaid Other

## 2020-06-07 DIAGNOSIS — Z510 Encounter for antineoplastic radiation therapy: Secondary | ICD-10-CM | POA: Diagnosis not present

## 2020-06-07 NOTE — Progress Notes (Signed)
Pt came in for treatment Sept 1st and was informed that he only had $99.70 left in his [place] account, patient was given a $25 Visa card and was informed that he could not be given anymore because he was only supposed to have 5 weeks of treatment and it was going on 10 weeks and he had been given 10 Visa cards.  Patient came for treatment 09/15 and asked for another [place] card.  I informed him I could not give him another card and that the remaining $74.70 would have to be applied toward medications or another bill.

## 2020-06-07 NOTE — Progress Notes (Signed)
Received patient in the clinic following radiation treatment. Patient requesting a work note to excuse him from work yesterday and today due to urinary frequency. Provided patient with a work note for today's treatment but did not provide one for yesterday since he did not come in for radiation treatment.

## 2020-06-08 ENCOUNTER — Encounter: Payer: Self-pay | Admitting: Medical Oncology

## 2020-06-08 ENCOUNTER — Ambulatory Visit: Payer: Medicaid Other

## 2020-06-08 ENCOUNTER — Encounter: Payer: Self-pay | Admitting: Radiation Oncology

## 2020-06-08 ENCOUNTER — Ambulatory Visit
Admission: RE | Admit: 2020-06-08 | Discharge: 2020-06-08 | Disposition: A | Discharge status: Home / Self Care | Payer: Medicaid Other | Source: Ambulatory Visit | Attending: Radiation Oncology | Admitting: Radiation Oncology

## 2020-06-08 ENCOUNTER — Other Ambulatory Visit: Payer: Self-pay

## 2020-06-08 ENCOUNTER — Encounter: Payer: Self-pay | Admitting: Urology

## 2020-06-08 DIAGNOSIS — Z510 Encounter for antineoplastic radiation therapy: Secondary | ICD-10-CM | POA: Diagnosis not present

## 2020-06-09 ENCOUNTER — Ambulatory Visit: Payer: Medicaid Other

## 2020-06-13 ENCOUNTER — Ambulatory Visit: Payer: Self-pay

## 2020-06-21 ENCOUNTER — Ambulatory Visit: Payer: Self-pay

## 2020-06-21 ENCOUNTER — Telehealth: Payer: Self-pay

## 2020-06-21 NOTE — Telephone Encounter (Signed)
As per [facility] of [place], the case is active

## 2020-06-22 ENCOUNTER — Ambulatory Visit: Payer: Self-pay | Admitting: Urology

## 2020-06-27 ENCOUNTER — Telehealth: Payer: Self-pay

## 2020-06-27 NOTE — Telephone Encounter (Signed)
Called patient in regards to appointment with Freeman Caldron PA on 06/28/20 at 10:30am. Could not get through on [phone]. Phone beeped and did not ring. Made 3 attempts to reach patient. TM

## 2020-06-28 ENCOUNTER — Ambulatory Visit
Admission: RE | Admit: 2020-06-28 | Discharge: 2020-06-28 | Disposition: A | Discharge status: Home / Self Care | Payer: MEDICAID | Source: Ambulatory Visit | Attending: Urology | Admitting: Urology

## 2020-06-28 ENCOUNTER — Other Ambulatory Visit: Payer: Self-pay

## 2020-06-28 DIAGNOSIS — C61 Malignant neoplasm of prostate: Secondary | ICD-10-CM

## 2020-06-28 NOTE — Progress Notes (Signed)
  Radiation Oncology         [phone]) [phone] ________________________________  Name: Randy Hansen MRN: [id number]  Date: 06/08/2020  DOB: 1961-02-18  End of Treatment Note  Diagnosis:   59 y.o. gentleman with Stage T1c adenocarcinoma of the prostate with Gleason score of 3+4, and PSA of 7.15.     Indication for treatment:  Curative, Definitive Radiotherapy       Radiation treatment dates:   03/09/20 - 06/08/20  Site/dose:   The prostate was treated to 70 Gy in 28 fractions of 2.5 Gy  Beams/energy:   The patient was treated with IMRT using volumetric arc therapy delivering 6 MV X-rays to clockwise and counterclockwise circumferential arcs with a 90 degree collimator offset to avoid dose scalloping.  Image guidance was performed with daily cone beam CT prior to each fraction to align to gold markers in the prostate and assure proper bladder and rectal fill volumes.  Immobilization was achieved with BodyFix custom mold.  Narrative: The patient tolerated radiation treatment relatively well with some minor urinary irritation and modest fatigue. He did experience increased frequency, urgency, weak stream, straining to empty his bladder and nocturia 4-6 times per night. He specifically denied dysuria, gross hematuria or incontinence. He did develop some loose stool/diarrhea that was manageable and did not require medication.  Plan: The patient has completed radiation treatment. He will return to radiation oncology clinic for routine followup in one month. I advised him to call or return sooner if he has any questions or concerns related to his recovery or treatment. ________________________________  Sheral Apley. Tammi Klippel, M.D.

## 2020-06-28 NOTE — Progress Notes (Signed)
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    Interval Since Last Radiation:  3 weeks  03/09/20 - 06/08/20:   The prostate was treated to 70 Gy in 28 fractions of 2.5 Gy  Narrative:  I spoke with the patient to conduct his routine scheduled 1 month follow up visit via telephone to spare the patient unnecessary potential exposure in the healthcare setting during the current COVID-19 pandemic.  The patient was notified in advance and gave permission to proceed with this visit format.  He tolerated radiation treatment relatively well with some minor urinary irritation and modest fatigue. He did experience increased frequency, urgency, weak stream, straining to empty his bladder and nocturia 4-6 times per night. He specifically denied dysuria, gross hematuria or incontinence. He did develop some loose stool/diarrhea that was manageable and did not require medication.                              On review of systems, the patient states that he is doing well in general.  He reports gradual improvement in his LUTS but does continue with increased frequency and nocturia which he also associates with heavy fluid intake, particularly sodas and beer.  He specifically denies dysuria, gross hematuria, weak stream, straining to void, incomplete bladder emptying or incontinence.  His energy level is improving and he reports a healthy appetite and is maintaining his weight.  He denies abdominal pain, nausea, vomiting, diarrhea or constipation.  Overall, he is quite pleased with his progress to date.  ALLERGIES:  has No Known Allergies.  Meds: Current Outpatient Medications  Medication Sig Dispense Refill    . amLODipine (NORVASC) 10 MG tablet Take 1 tablet (10 mg total) by mouth daily. 90 tablet 0  . amoxicillin-clavulanate (AUGMENTIN) 875-125 MG tablet Take 1 tablet by mouth 2 (two) times daily. 20 tablet 0  . prazosin (MINIPRESS) 1 MG capsule Take 1 capsule (1 mg total) by mouth at bedtime. 30 capsule 2  . QUEtiapine (SEROQUEL) 100 MG tablet Take 1 tablet (100 mg total) by mouth at bedtime. 30 tablet 2  . sildenafil (VIAGRA) 25 MG tablet Take 4 tablets (100 mg total) by mouth daily as needed for erectile dysfunction. 15 tablet 5   No current facility-administered medications for this encounter.    Physical Findings:  vitals were not taken for this visit.   /Unable to assess due to telephone follow-up visit format.  Lab Findings: Lab Results  Component Value Date   WBC 5.3 02/22/2019   HGB 14.9 02/22/2019   HCT 44.8 02/22/2019   MCV 92 02/22/2019   PLT 248 02/22/2019     Radiographic Findings: No results found.  Impression/Plan: 1. 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   He will continue to follow up with urology for ongoing PSA determinations and has an appointment scheduled with Dr. Lovena Neighbours, though he cannot recall the specific date. He understands what to expect with regards to PSA monitoring going forward. I will look forward to following his response to treatment via correspondence with urology, and would be happy to  continue to participate in his care if clinically indicated. I talked to the patient about what to expect in the future, including his risk for erectile dysfunction and rectal bleeding. I encouraged him to call or return to the office if he has any questions regarding his previous radiation or possible radiation side effects. He was comfortable with this plan and will follow up as needed.    Nicholos Johns, PA-C

## 2020-06-30 ENCOUNTER — Ambulatory Visit: Payer: Self-pay

## 2020-07-05 ENCOUNTER — Telehealth: Payer: Self-pay | Admitting: Radiation Oncology

## 2020-07-05 ENCOUNTER — Telehealth: Payer: Self-pay | Admitting: *Deleted

## 2020-07-05 NOTE — Telephone Encounter (Signed)
Received voicemail message from patient requesting return call. No further details listed. Phoned patient to inquire. No answer. No option to leave a message.

## 2020-07-05 NOTE — Telephone Encounter (Signed)
RETURNED PATIENT'S PHONE CALL, LVM FOR A RETURN CALL 

## 2020-07-06 ENCOUNTER — Telehealth: Payer: Self-pay | Admitting: *Deleted

## 2020-07-06 ENCOUNTER — Encounter: Payer: Self-pay | Admitting: *Deleted

## 2020-07-06 ENCOUNTER — Ambulatory Visit: Payer: Self-pay

## 2020-07-06 NOTE — Telephone Encounter (Signed)
Returned patient's phone call, spoke with patient 

## 2020-07-12 ENCOUNTER — Telehealth: Payer: Self-pay | Admitting: Internal Medicine

## 2020-07-12 NOTE — Telephone Encounter (Signed)
Copied from [place] [id number]. Topic: General - Inquiry >> Jul 12, 2020 12:52 PM Gillis Ends D wrote: Reason for CRM: Patient would like for Clifton James to give him a call at [phone]. Please advise

## 2020-07-13 NOTE — Telephone Encounter (Signed)
I call the Pt an explain him that he has Medicaid that he does not need to apply for any [facility] program since he has insurance and can't qualified for them

## 2020-07-19 ENCOUNTER — Ambulatory Visit: Payer: Self-pay

## 2020-08-08 ENCOUNTER — Telehealth: Payer: Self-pay | Admitting: Internal Medicine

## 2020-08-16 ENCOUNTER — Other Ambulatory Visit: Payer: Self-pay

## 2020-08-16 ENCOUNTER — Emergency Department (HOSPITAL_COMMUNITY)
Admission: EM | Admit: 2020-08-16 | Discharge: 2020-08-17 | Disposition: A | Discharge status: Home / Self Care | Payer: Medicaid Other | Attending: Emergency Medicine | Admitting: Emergency Medicine

## 2020-08-16 ENCOUNTER — Encounter (HOSPITAL_COMMUNITY): Payer: Self-pay

## 2020-08-16 DIAGNOSIS — M545 Low back pain, unspecified: Secondary | ICD-10-CM | POA: Insufficient documentation

## 2020-08-16 DIAGNOSIS — M542 Cervicalgia: Secondary | ICD-10-CM | POA: Insufficient documentation

## 2020-08-16 DIAGNOSIS — T148XXA Other injury of unspecified body region, initial encounter: Secondary | ICD-10-CM

## 2020-08-16 DIAGNOSIS — Z79899 Other long term (current) drug therapy: Secondary | ICD-10-CM | POA: Insufficient documentation

## 2020-08-16 DIAGNOSIS — R21 Rash and other nonspecific skin eruption: Secondary | ICD-10-CM

## 2020-08-16 DIAGNOSIS — F17210 Nicotine dependence, cigarettes, uncomplicated: Secondary | ICD-10-CM | POA: Diagnosis not present

## 2020-08-16 DIAGNOSIS — Z7984 Long term (current) use of oral hypoglycemic drugs: Secondary | ICD-10-CM | POA: Insufficient documentation

## 2020-08-16 DIAGNOSIS — R7303 Prediabetes: Secondary | ICD-10-CM | POA: Insufficient documentation

## 2020-08-16 DIAGNOSIS — Y92410 Unspecified street and highway as the place of occurrence of the external cause: Secondary | ICD-10-CM | POA: Insufficient documentation

## 2020-08-16 DIAGNOSIS — I10 Essential (primary) hypertension: Secondary | ICD-10-CM | POA: Insufficient documentation
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
## 2020-08-17 MED ORDER — IBUPROFEN 400 MG PO TABS
600.0000 mg | ORAL_TABLET | Freq: Once | ORAL | Status: AC
  Administered 2020-08-17: 600 mg via ORAL
  Filled 2020-08-17: qty 1
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
## 2020-08-17 NOTE — Discharge Instructions (Addendum)
You may use over-the-counter Motrin (Ibuprofen), Acetaminophen (Tylenol), topical muscle creams such as SalonPas, [employer], Bengay, etc. Please stretch, apply heat, and have massage therapy for additional assistance.  For your rash, you can use cortisone cream as directed on the package. See your PCP if it does not resolve in 2 weeks.

## 2020-08-24 ENCOUNTER — Telehealth (INDEPENDENT_AMBULATORY_CARE_PROVIDER_SITE_OTHER): Payer: No Payment, Other | Admitting: Psychiatry

## 2020-08-24 ENCOUNTER — Encounter (HOSPITAL_COMMUNITY): Payer: Self-pay | Admitting: Psychiatry

## 2020-08-24 ENCOUNTER — Other Ambulatory Visit: Payer: Self-pay

## 2020-08-24 DIAGNOSIS — F1994 Other psychoactive substance use, unspecified with psychoactive substance-induced mood disorder: Secondary | ICD-10-CM | POA: Diagnosis not present

## 2020-08-24 DIAGNOSIS — F4310 Post-traumatic stress disorder, unspecified: Secondary | ICD-10-CM | POA: Diagnosis not present

## 2020-08-24 MED ORDER — QUETIAPINE FUMARATE 100 MG PO TABS: 100.0000 mg | ORAL_TABLET | Freq: Every day | ORAL | 2 refills | Status: DC

## 2020-08-24 MED ORDER — PRAZOSIN HCL 1 MG PO CAPS: 1.0000 mg | ORAL_CAPSULE | Freq: Every day | ORAL | 2 refills | Status: DC

## 2020-08-24 MED FILL — QUETIAPINE FUMARATE 100 MG: 100 | 30 days supply | Qty: 30 | Fill #0

## 2020-08-24 MED FILL — PRAZOSIN 1 MG CAPSULE: 1 | 30 days supply | Qty: 30 | Fill #0

## 2020-08-24 NOTE — Progress Notes (Signed)
[place] MD/PA/NP OP Progress Note Virtual Visit via Telephone Note  I connected with [name] on 08/24/20 at 11:00 AM EST by telephone and verified that I am speaking with the correct person using two identifiers.  Location: Patient: home Provider: Clinic   I discussed the limitations, risks, security and privacy concerns of performing an evaluation and management service by telephone and the availability of in person appointments. I also discussed with the patient that there may be a patient responsible charge related to this service. The patient expressed understanding and agreed to proceed.   I provided 30 minutes of non-face-to-face time during this encounter.    08/24/2020 11:57 AM [name]  MRN:  [id number]  Chief Complaint: "I've been grouchy in the morning."  HPI: 59 year old male seen today for a follow-up psychiatric evaluation.  He has a psychiatric history of depression, substance induced mood disorder, and PTSD. He is prescribed Seroquel 100 mg at bedtime and prazosin 1 mg at bedtime.  However, the patient reports that he has been unable to pick up his medications because he did not have any transportation was not approved for Medicaid at that time.  He reports that he is now approved for Medicaid so he plans to pick up his prescription soon.  Patient was unable to access video on his phone today and his assessment was completed over the phone.  Today he was well engaged in conversation and cooperative.  Today he describes his mood as being depressed.  He endorsed depressed mood, irritability, poor sleep, feelings of worthlessness, and poor concentration.  He denies any SI/HI.  Patient endorses having mood swings, increased irritability, distractibility, spending money impulsively, racing thoughts, as well as visual and auditory hallucinations.  He states that he often sees the prison guards from his previous incarceration, sees old friends who are no longer living,  hears cell bars slamming closed, and hears people screaming to get out of here or let me out of here.  He also endorses having flashbacks and reliving being locked up.  Patient also reports that he is still having nightmares that he is having to fight someone or someone is trying to threaten him.  He reports that sometimes he does feel paranoid like someone is watching him or trying to harm him.  Patient denies any symptoms of anxiety.  He reports that he only occasionally worries about his finances and ability to pay for his basic needs or about how things will work out for him in the future.  Patient reports that he no longer uses marijuana, cocaine, and alcohol daily.  He reports that  the last time that he consumed alcohol was last night he reports having three beers.  He also reports that the last time that he used any illicit substances was last month.  However he later notes during the conversation that he usually only has hallucinations and paranoia when using marijuana and cocaine.    Patient reports that he attempted to contact [employer] but reports that they want money from him.  He reports that he is trying to to be approved for social security benefits for disability.  He notes that he has been working on the weekends cutting grass.  Patient is still agreeable to start Seroquel 100 mg to help manage his mood, VAH, and sleep. He is also agreeable to start Prazosin 1 mg nightly to help manage PTSD symptoms.  Patient reports that he has not followed up with outpatient therapy  for counseling.  However he reports that he may like to seek counseling.  No other concerns noted at this time.   Visit Diagnosis:    ICD-10-CM   1. PTSD (post-traumatic stress disorder)  F43.10 prazosin (MINIPRESS) 1 MG capsule  2. Substance induced mood disorder (HCC)  F19.94 QUEtiapine (SEROQUEL) 100 MG tablet    Past Psychiatric History: depression and PTSD  Past Medical History:  Past Medical History:   Diagnosis Date  . Adrenal adenoma, right 2018  . Cancer ([place])    Phreesia 04/20/2020  . Essential hypertension    dx by pcp 02-22-2019 per note in epic was given medication    (02-03-2020  asked pt about medication for bp and stated he stopped taking long time age , he does not have any problems now,  his bp wsa high "only because I was incarcerated I could not exercise and gained weight")  . Hyperlipidemia    (02-03-2020  pt denies )  . Hyperplasia of prostate with lower urinary tract symptoms (LUTS)   . Lipoma    left hip  . Pre-diabetes    dx 02-22-2019 by pcp note in epic was given metformin;  per next note 07/ 2020 pt had stopped taking metformin   (02-03-2020  pt denies having pre-diabetes and denied ever taking medication)  . Prostate cancer [facility]) urologist--- dr winter/  oncologsit--- dr Tammi Klippel   dx [phone]  Stage T1c,  Gleason 3+4    Past Surgical History:  Procedure Laterality Date  . GOLD SEED IMPLANT N/A 02/11/2020   Procedure: GOLD SEED IMPLANT;  Surgeon: Ceasar Mons, MD;  Location: [facility];  Service: Urology;  Laterality: N/A;  . NO PAST SURGERIES    . PROSTATE BIOPSY    . SPACE OAR INSTILLATION N/A 02/11/2020   Procedure: SPACE OAR INSTILLATION;  Surgeon: Ceasar Mons, MD;  Location: [facility];  Service: Urology;  Laterality: N/A;  . TRANSRECTAL ULTRASOUND N/A 02/11/2020   Procedure: TRANSRECTAL ULTRASOUND;  Surgeon: Ceasar Mons, MD;  Location[facility];  Service: Urology;  Laterality: N/A;  ONLY NEED 30 MIN FOR ALL PROCEDURES    Family Psychiatric History:  Sibling substance use  Family History:  Family History  Problem Relation Age of Onset  . Hypertension Mother   . Breast cancer Mother   . Hypertension Father   . Hypertension Maternal Grandmother   . Prostate cancer Neg Hx   . Colon cancer Neg Hx   . Pancreatic cancer Neg Hx     Social History:  Social History    Socioeconomic History  . Marital status: Single    Spouse name: Not on file  . Number of children: 1  . Years of education: Not on file  . Highest education level: Not on file  Occupational History  . Not on file  Tobacco Use  . Smoking status: Current Some Day Smoker    Packs/day: 0.50    Years: 40.00    Pack years: 20.00    Types: Cigarettes  . Smokeless tobacco: Never Used  . Tobacco comment: 02-03-2020 per pt 1/2 pp7d  Vaping Use  . Vaping Use: Never used  Substance and Sexual Activity  . Alcohol use: Yes    Alcohol/week: 7.0 - 14.0 standard drinks    Types: 7 - 14 Cans of beer per week    Comment: 1-2 beer daily  . Drug use: Never  . Sexual activity: Yes  Other Topics Concern  .  Not on file  Social History Narrative  . Not on file   Social Determinants of Health   Financial Resource Strain:   . Difficulty of Paying Living Expenses: Not on file  Food Insecurity:   . Worried About Charity fundraiser in the Last Year: Not on file  . Ran Out of Food in the Last Year: Not on file  Transportation Needs:   . Lack of Transportation (Medical): Not on file  . Lack of Transportation (Non-Medical): Not on file  Physical Activity:   . Days of Exercise per Week: Not on file  . Minutes of Exercise per Session: Not on file  Stress:   . Feeling of Stress : Not on file  Social Connections:   . Frequency of Communication with Friends and Family: Not on file  . Frequency of Social Gatherings with Friends and Family: Not on file  . Attends Religious Services: Not on file  . Active Member of Clubs or Organizations: Not on file  . Attends Archivist Meetings: Not on file  . Marital Status: Not on file    Allergies: No Known Allergies  Metabolic Disorder Labs: Lab Results  Component Value Date   HGBA1C 6.3 (H) 06/07/2019   No results found for: PROLACTIN Lab Results  Component Value Date   CHOL 189 04/19/2019   TRIG 309 (H) 04/19/2019   HDL 46 04/19/2019    CHOLHDL 4.1 04/19/2019   LDLCALC 81 04/19/2019   LDLCALC 87 02/28/2017   Lab Results  Component Value Date   TSH 1.220 02/22/2019    Therapeutic Level Labs: No results found for: LITHIUM No results found for: VALPROATE No components found for:  CBMZ  Current Medications: Current Outpatient Medications  Medication Sig Dispense Refill  . amLODipine (NORVASC) 10 MG tablet Take 1 tablet (10 mg total) by mouth daily. 90 tablet 0  . amoxicillin-clavulanate (AUGMENTIN) 875-125 MG tablet Take 1 tablet by mouth 2 (two) times daily. 20 tablet 0  . prazosin (MINIPRESS) 1 MG capsule Take 1 capsule (1 mg total) by mouth at bedtime. 30 capsule 2  . QUEtiapine (SEROQUEL) 100 MG tablet Take 1 tablet (100 mg total) by mouth at bedtime. 30 tablet 2  . sildenafil (VIAGRA) 25 MG tablet Take 4 tablets (100 mg total) by mouth daily as needed for erectile dysfunction. 15 tablet 5   No current facility-administered medications for this visit.     Musculoskeletal: Strength & Muscle Tone: Unable to assess due to telephone visit Gait & Station: Unable to assess due to telephone visit Patient leans: N/A  Psychiatric Specialty Exam: Review of Systems  There were no vitals taken for this visit.There is no height or weight on file to calculate BMI.  General Appearance: Unable to assess due to telephone visit  Eye Contact:  Unable to assess due to telephone visit  Speech:  Clear and Coherent and Normal Rate  Volume:  Normal  Mood:  Anxious and Depressed  Affect:  Appropriate and Congruent  Thought Process:  Coherent, Goal Directed and Linear  Orientation:  Full (Time, Place, and Person)  Thought Content: WDL and Logical   Suicidal Thoughts:  No  Homicidal Thoughts:  No  Memory:  Immediate;   Good Recent;   Good Remote;   Good  Judgement:  Fair  Insight:  Fair  Psychomotor Activity:  Normal  Concentration:  Concentration: Good and Attention Span: Good  Recall:  Good  Fund of Knowledge: Good   Language: Good  Akathisia:  No  Handed:  Right  AIMS (if indicated): not done  Assets:  Communication Skills Desire for Improvement Financial Resources/Insurance Housing Social Support  ADL's:  Intact  Cognition: WNL  Sleep:  Fair   Screenings: GAD-7     Office Visit from 06/07/2019 in [facility] at [facility] Visit from 11/12/2018 in [place] at [facility]  Total GAD-7 Score 9 13    PHQ2-9     Office Visit from 06/07/2019 in [place] at [facility] Visit from 11/12/2018 in [place] at [facility] Visit from 08/13/2017 in [place] Office Visit from 02/11/2017 in [place] Office Visit from 12/04/2016 in [place]  PHQ-2 Total Score 2 1 0 0 1  PHQ-9 Total Score 10 9 -- -- --       Assessment and Plan: Patient reports that he continues to have symptoms of psychosis, depression, and PTSD.  Patient reports that he was unable to start his medications after his last visit due to lack of transportation to the pharmacy.  He is still agreeable to start Seroquel 100 mg to help manage his mood, VAH, and sleep. He is also agreeable to start Prazosin 1 mg nightly to help manage PTSD symptoms.   1. PTSD (post-traumatic stress disorder)  Start - prazosin (MINIPRESS) 1 MG capsule; Take 1 capsule (1 mg total) by mouth at bedtime.  Dispense: 30 capsule; Refill: 2 - Ambulatory referral to Social Work  2. Substance induced mood disorder (HCC)  Start - QUEtiapine (SEROQUEL) 100 MG tablet; Take 1 tablet (100 mg total) by mouth at bedtime.  Dispense: 30 tablet; Refill: 2 - Ambulatory referral to Social Work   Follow-up in 3 months. Follow-up with therapy as scheduled.  Salley Slaughter, NP 08/24/2020, 11:57 AM

## 2020-10-24 ENCOUNTER — Ambulatory Visit: Payer: Medicaid Other | Admitting: Internal Medicine

## 2020-10-26 ENCOUNTER — Telehealth: Payer: Medicaid Other | Admitting: Internal Medicine

## 2020-10-26 ENCOUNTER — Encounter: Payer: Self-pay | Admitting: Internal Medicine

## 2020-10-26 ENCOUNTER — Other Ambulatory Visit: Payer: Self-pay | Admitting: Internal Medicine

## 2020-10-26 ENCOUNTER — Telehealth (INDEPENDENT_AMBULATORY_CARE_PROVIDER_SITE_OTHER): Payer: Medicaid Other | Admitting: Internal Medicine

## 2020-10-26 VITALS — Ht 71.0 in | Wt 260.0 lb

## 2020-10-26 DIAGNOSIS — I10 Essential (primary) hypertension: Secondary | ICD-10-CM | POA: Diagnosis not present

## 2020-10-26 DIAGNOSIS — K047 Periapical abscess without sinus: Secondary | ICD-10-CM | POA: Diagnosis not present

## 2020-10-26 DIAGNOSIS — H539 Unspecified visual disturbance: Secondary | ICD-10-CM

## 2020-10-26 DIAGNOSIS — R0981 Nasal congestion: Secondary | ICD-10-CM

## 2020-10-26 DIAGNOSIS — N529 Male erectile dysfunction, unspecified: Secondary | ICD-10-CM

## 2020-10-26 DIAGNOSIS — M21612 Bunion of left foot: Secondary | ICD-10-CM

## 2020-10-26 MED ORDER — SILDENAFIL CITRATE 25 MG PO TABS: 100.0000 mg | ORAL_TABLET | Freq: Every day | ORAL | 5 refills | Status: DC | PRN

## 2020-10-26 MED ORDER — FLUTICASONE PROPIONATE 50 MCG/ACT NA SUSP: 2.0000 | Freq: Every day | NASAL | 6 refills | Status: DC

## 2020-10-26 MED ORDER — AMLODIPINE BESYLATE 10 MG PO TABS: 10.0000 mg | ORAL_TABLET | Freq: Every day | ORAL | 0 refills | Status: DC

## 2020-10-26 MED ORDER — AMOXICILLIN-POT CLAVULANATE 875-125 MG PO TABS: 1.0000 | ORAL_TABLET | Freq: Two times a day (BID) | ORAL | 0 refills | Status: DC

## 2020-10-26 MED FILL — AMOX-CLAV 875-125 MG TABLET: 875-125 | 10 days supply | Qty: 20 | Fill #0

## 2020-10-26 MED FILL — FLUTICASONE PROP 50 MCG SPR: 50 | 30 days supply | Qty: 16 | Fill #0

## 2020-10-26 MED FILL — AMLODIPINE BESYLATE 10 MG T: 10 | 90 days supply | Qty: 90 | Fill #0

## 2020-10-26 NOTE — Progress Notes (Signed)
Virtual Visit via Telephone Note  I connected with Randy Hansen, on 10/26/2020 at 1:44 PM by telephone due to the COVID-19 pandemic and verified that I am speaking with the correct person using two identifiers.   Consent: I discussed the limitations, risks, security and privacy concerns of performing an evaluation and management service by telephone and the availability of in person appointments. I also discussed with the patient that there may be a patient responsible charge related to this service. The patient expressed understanding and agreed to proceed.   Location of Patient: Home   Location of Provider: Clinic    Persons participating in Telemedicine visit: Rush Salce Dr. Juleen China    History of Present Illness: Patient has a visit for acute concerns. He just received Medicaid and would like to ask for some medications and referrals.    Requests antibiotic for tooth infection. This has been recurrent. He has a visit scheduled with a dentist upcoming.   He asks for refill for his BP medications. Not monitoring BP. No chest pain, vision floaters, severe headaches.   He asks for medication to help with chronic nasal congestion. He has a lot of sinus pressure and some voice hoarseness. This has been present for months.   Also asks for referral to podiatry for bunion.    Past Medical History:  Diagnosis Date  . Adrenal adenoma, right 2018  . Cancer ([place])    Phreesia 04/20/2020  . Essential hypertension    dx by pcp 02-22-2019 per note in epic was given medication    (02-03-2020  asked pt about medication for bp and stated he stopped taking long time age , he does not have any problems now,  his bp wsa high "only because I was incarcerated I could not exercise and gained weight")  . Hyperlipidemia    (02-03-2020  pt denies )  . Hyperplasia of prostate with lower urinary tract symptoms (LUTS)   . Lipoma    left hip  . Pre-diabetes    dx 02-22-2019 by  pcp note in epic was given metformin;  per next note 07/ 2020 pt had stopped taking metformin   (02-03-2020  pt denies having pre-diabetes and denied ever taking medication)  . Prostate cancer [facility]) urologist--- dr winter/  oncologsit--- dr Tammi Klippel   dx [phone]  Stage T1c,  Gleason 3+4   No Known Allergies  Current Outpatient Medications on File Prior to Visit  Medication Sig Dispense Refill  . amLODipine (NORVASC) 10 MG tablet Take 1 tablet (10 mg total) by mouth daily. 90 tablet 0  . prazosin (MINIPRESS) 1 MG capsule Take 1 capsule (1 mg total) by mouth at bedtime. 30 capsule 2  . QUEtiapine (SEROQUEL) 100 MG tablet Take 1 tablet (100 mg total) by mouth at bedtime. 30 tablet 2  . sildenafil (VIAGRA) 25 MG tablet Take 4 tablets (100 mg total) by mouth daily as needed for erectile dysfunction. 15 tablet 5  . amoxicillin-clavulanate (AUGMENTIN) 875-125 MG tablet Take 1 tablet by mouth 2 (two) times daily. (Patient not taking: Reported on 10/26/2020) 20 tablet 0  . [DISCONTINUED] metFORMIN (GLUCOPHAGE) 500 MG tablet Take 1 tablet (500 mg total) by mouth 2 (two) times daily with a meal. (Patient not taking: Reported on 04/19/2019) 180 tablet 3   No current facility-administered medications on file prior to visit.    Observations/Objective: NAD. Speaking clearly.  Work of breathing normal.  Alert and oriented. Mood appropriate.   Assessment and Plan: 1.  Dental infection Discussed importance of following up with dentist.  - amoxicillin-clavulanate (AUGMENTIN) 875-125 MG tablet; Take 1 tablet by mouth 2 (two) times daily.  Dispense: 20 tablet; Refill: 0  2. Essential hypertension Asymptomatic. Continue medication. Will monitor at upcoming in person visits.  - amLODipine (NORVASC) 10 MG tablet; Take 1 tablet (10 mg total) by mouth daily.  Dispense: 90 tablet; Refill: 0  3. Vision changes Needs vision exam and likely glasses.  - Ambulatory referral to Optometry  4. Bunion, left foot -  Ambulatory referral to Podiatry  5. Nasal congestion - fluticasone (FLONASE) 50 MCG/ACT nasal spray; Place 2 sprays into both nostrils daily.  Dispense: 16 g; Refill: 6  6. Erectile dysfunction, unspecified erectile dysfunction type - sildenafil (VIAGRA) 25 MG tablet; Take 4 tablets (100 mg total) by mouth daily as needed for erectile dysfunction.  Dispense: 15 tablet; Refill: 5   Follow Up Instructions: PRN and for routine medical care    I discussed the assessment and treatment plan with the patient. The patient was provided an opportunity to ask questions and all were answered. The patient agreed with the plan and demonstrated an understanding of the instructions.   The patient was advised to call back or seek an in-person evaluation if the symptoms worsen or if the condition fails to improve as anticipated.     I provided 15 minutes total of non-face-to-face time during this encounter including median intraservice time, reviewing previous notes, investigations, ordering medications, medical decision making, coordinating care and patient verbalized understanding at the end of the visit.    Phill Myron, D.O. Primary Care at [facility]  10/26/2020, 1:44 PM

## 2020-10-26 NOTE — Progress Notes (Signed)
Pt just recently received Medicaid so would like referrals to some specialists (eye doc, podiatrist and pulmonologist-reports increase SHOB, cough, hoarseness, scratchy throat/nasal congestion x 2 months- current long-term cigarette use)

## 2020-11-02 ENCOUNTER — Ambulatory Visit: Payer: Medicaid Other | Admitting: Podiatry

## 2020-11-03 ENCOUNTER — Telehealth: Payer: Self-pay | Admitting: Radiation Oncology

## 2020-11-03 ENCOUNTER — Telehealth: Payer: Self-pay | Admitting: *Deleted

## 2020-11-03 MED FILL — AMLODIPINE BESYLATE 10 MG T: 10 | 90 days supply | Qty: 90 | Fill #0

## 2020-11-03 MED FILL — FLUTICASONE PROP 50 MCG SPR: 50 | 30 days supply | Qty: 16 | Fill #0

## 2020-11-03 MED FILL — AMOX-CLAV 875-125 MG TABLET: 875-125 | 10 days supply | Qty: 20 | Fill #0

## 2020-11-03 NOTE — Telephone Encounter (Signed)
Received voicemail message from patient that he lost all letter printed by this office and he needs them to "try and get disability." Reprinted all three letters. Romie Jumper committed to call patient and determine if he would like the letters mailed to his home or if he would like to pick them up. Patient has completed radiation therapy and been released.

## 2020-11-03 NOTE — Telephone Encounter (Signed)
Patient called and left voicemail about wanting to schedule a lung cancer screening. Called patient back. Let him know that I would send the referral to the nurse Langley Gauss at [facility] to set up his shared decision making visit for the lung cancer screening. Informed patient that she will call him back with the appointment. Patient also asked about scheduling a colonoscopy. Patient stated he has Medicaid. Let him know that he will need to talk with his PCP and his PCP will refer him. Patient verbalized understanding.  Referral sent by in basket to Doroteo Glassman for the lung cancer screening.

## 2020-11-09 ENCOUNTER — Ambulatory Visit: Payer: Self-pay | Admitting: General Surgery

## 2020-11-14 ENCOUNTER — Ambulatory Visit: Payer: Self-pay | Admitting: General Surgery

## 2020-11-16 ENCOUNTER — Ambulatory Visit (INDEPENDENT_AMBULATORY_CARE_PROVIDER_SITE_OTHER): Payer: Medicaid Other

## 2020-11-16 ENCOUNTER — Telehealth: Payer: Self-pay | Admitting: Podiatry

## 2020-11-16 ENCOUNTER — Ambulatory Visit: Payer: Self-pay | Admitting: General Surgery

## 2020-11-16 ENCOUNTER — Encounter: Payer: Self-pay | Admitting: Podiatry

## 2020-11-16 ENCOUNTER — Ambulatory Visit (INDEPENDENT_AMBULATORY_CARE_PROVIDER_SITE_OTHER): Payer: Medicaid Other | Admitting: Podiatry

## 2020-11-16 ENCOUNTER — Other Ambulatory Visit: Payer: Self-pay

## 2020-11-16 DIAGNOSIS — M7751 Other enthesopathy of right foot: Secondary | ICD-10-CM

## 2020-11-16 DIAGNOSIS — M2042 Other hammer toe(s) (acquired), left foot: Secondary | ICD-10-CM | POA: Diagnosis not present

## 2020-11-16 DIAGNOSIS — M7752 Other enthesopathy of left foot: Secondary | ICD-10-CM

## 2020-11-16 DIAGNOSIS — M21619 Bunion of unspecified foot: Secondary | ICD-10-CM

## 2020-11-16 DIAGNOSIS — M21612 Bunion of left foot: Secondary | ICD-10-CM | POA: Diagnosis not present

## 2020-11-16 DIAGNOSIS — M21611 Bunion of right foot: Secondary | ICD-10-CM | POA: Diagnosis not present

## 2020-11-16 DIAGNOSIS — M779 Enthesopathy, unspecified: Secondary | ICD-10-CM

## 2020-11-16 MED ORDER — TRIAMCINOLONE ACETONIDE 10 MG/ML IJ SUSP
10.0000 mg | Freq: Once | INTRAMUSCULAR | Status: AC
Start: 2020-11-16 — End: 2020-11-16
  Administered 2020-11-16: 10 mg

## 2020-11-16 MED ORDER — TRIAMCINOLONE ACETONIDE 10 MG/ML IJ SUSP
10.0000 mg | Freq: Once | INTRAMUSCULAR | Status: AC
  Administered 2020-11-16: 10 mg

## 2020-11-16 NOTE — Telephone Encounter (Signed)
Pt called asking about getting some orthopedic shoes and I explained that we have diabetic shoes. I asked pt if he was diabetic and he said not really so I told him insurance would not cover them and he hung up before I could even finish the sentence.

## 2020-11-19 NOTE — Progress Notes (Signed)
Subjective:   Patient ID: Randy Hansen, male   DOB: 60 y.o.   MRN: [id number]   HPI Patient presents with a lot of pain around the big toe joint of both feet stating its been bothering him for a while along with lesions on both feet that can become tender. He does not have a good health history and states that the pain has gotten gradually worse around the joints over the last year   Review of Systems  All other systems reviewed and are negative.       Objective:  Physical Exam Vitals and nursing note reviewed.  Constitutional:      Appearance: He is well-developed and well-nourished.  Cardiovascular:     Pulses: Intact distal pulses.  Pulmonary:     Effort: Pulmonary effort is normal.  Musculoskeletal:        General: Normal range of motion.  Skin:    General: Skin is warm.  Neurological:     Mental Status: He is alert.     Vascular status was found to be intact with neurological mildly diminished both sharp dull and vibratory. Patient has a large prominence around the first metatarsal head bilateral with mild range of motion loss bilateral and patient is noted to have good digital perfusion and well-oriented with keratotic lesions plantar aspect both feet quite thick when pressed.      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 Today I did sterile prep I injected around the first MPJ bilateral 3 mg dexamethasone Kenalog 5 mg Xylocaine advised on anti-inflammatories physical therapy and I instructed on shoe gear modifications. Patient will be seen back to recheck do not recommend surgery currently but it may be necessary at 1 point in future  X-rays indicate moderate elevation of the first intermetatarsal angle with elevation of the first metatarsal segment bilateral with small spur formation and mild reduction of  the first MPJ joint space

## 2020-11-22 ENCOUNTER — Other Ambulatory Visit: Payer: Self-pay

## 2020-11-22 ENCOUNTER — Telehealth (INDEPENDENT_AMBULATORY_CARE_PROVIDER_SITE_OTHER): Payer: Medicaid Other | Admitting: Psychiatry

## 2020-11-22 ENCOUNTER — Other Ambulatory Visit: Payer: Self-pay | Admitting: Psychiatry

## 2020-11-22 ENCOUNTER — Encounter (HOSPITAL_COMMUNITY): Payer: Self-pay | Admitting: Psychiatry

## 2020-11-22 DIAGNOSIS — F4310 Post-traumatic stress disorder, unspecified: Secondary | ICD-10-CM

## 2020-11-22 DIAGNOSIS — F1994 Other psychoactive substance use, unspecified with psychoactive substance-induced mood disorder: Secondary | ICD-10-CM

## 2020-11-22 MED ORDER — QUETIAPINE FUMARATE 100 MG PO TABS: 100.0000 mg | ORAL_TABLET | Freq: Every day | ORAL | 2 refills | Status: DC

## 2020-11-22 MED ORDER — PRAZOSIN HCL 1 MG PO CAPS: 1.0000 mg | ORAL_CAPSULE | Freq: Every day | ORAL | 2 refills | Status: DC

## 2020-11-22 MED FILL — PRAZOSIN 1 MG CAPSULE: 1 | 30 days supply | Qty: 30 | Fill #0

## 2020-11-22 MED FILL — QUETIAPINE FUMARATE 100 MG: 100 | 30 days supply | Qty: 30 | Fill #0

## 2020-11-22 NOTE — Progress Notes (Signed)
[place] MD/PA/NP OP Progress Note Virtual Visit via Telephone Note  I connected with Randy Hansen on 11/22/20 at  4:00 PM EST by telephone and verified that I am speaking with the correct person using two identifiers.  Location: Patient: home Provider: Clinic   I discussed the limitations, risks, security and privacy concerns of performing an evaluation and management service by telephone and the availability of in person appointments. I also discussed with the patient that there may be a patient responsible charge related to this service. The patient expressed understanding and agreed to proceed.   I provided 30 minutes of non-face-to-face time during this encounter.    11/22/2020 2:08 PM Randy Hansen  MRN:  [id number]  Chief Complaint: "I wasn't able to pick up my medications."  HPI: 60 year old male seen today for a follow-up psychiatric evaluation.  He has a psychiatric history of depression, substance induced mood disorder, and PTSD. He is prescribed Seroquel 100 mg at bedtime and prazosin 1 mg at bedtime. Patient informed provider that was unable to pick up his medications because he was unable to afford it.  Today patient was unable to login virtually so his assessment was done on the phone. During exam he was pleasant, cooperative, and engaged in conversation. He notes that since his last visit he has been more depressed and anxious. Provider conducted a GAD-7 and patient scored a 17.  He notes that he constantly worries about his finances and his health.  He notes that he has prostate cancer and is unemployed at this time.  He informed provider that he is $4000 past due in his rent and asked provided if she knew of any resources.  Provider informed patient that she would talk to the care management team regarding him.    Today Probation officer conducted a PHQ-9 and patient scored a 14. Patient notes that since he was unable to pick up his medications he continues to self medicate on alcohol,  marijuana, and cocaine. He notes that he cannot shake his substance use and notes that he desires help. Provider informed patient of [facility] to help manage his substance use. Provider also informed patient that if he needed to detox of alcohol he can be seen in the emergency room for further evaluation and assistance. Provider referred patient to outpatient counseling for therapy. Today he notes that he continues to have [facility]. He notes that he sees and hears his deceased loved ones. Today he denies SI/HI/ or paranoia. He does endorse fluctuations in mood, increased irritability, distractibility, spending money impulsively, racing thoughts, as well as visual and auditory hallucinations.  Patient notes that he continues to have nightmares surrounding being in prison. He notes that he often sees the prison guards from his previous incarceration, sees old friends who are no longer living, hears cell bars slamming closed, and hears people screaming to get out of here or let me out of here.  He also endorses having flashbacks and reliving being locked up.    Patient is agreeable to start restart Seroquel 100 mg to help manage his mood, VAH, and sleep. He is also agreeable to restart Prazosin 1 mg nightly to help manage PTSD symptoms. He will follow up with outpatient counseling for therapy. No other concerns noted at this time.   Visit Diagnosis:    ICD-10-CM   1. Substance induced mood disorder (HCC)  F19.94 QUEtiapine (SEROQUEL) 100 MG tablet  2. PTSD (post-traumatic stress disorder)  F43.10 prazosin (MINIPRESS) 1 MG capsule  Past Psychiatric History: depression and PTSD  Past Medical History:  Past Medical History:  Diagnosis Date  . Adrenal adenoma, right 2018  . Cancer ([place])    [employer] 04/20/2020  . Essential hypertension    dx by pcp 02-22-2019 per note in epic was given medication    (02-03-2020  asked pt about medication for bp and stated he stopped taking long time age , he does not have  any problems now,  his bp wsa high "only because I was incarcerated I could not exercise and gained weight")  . Hyperlipidemia    (02-03-2020  pt denies )  . Hyperplasia of prostate with lower urinary tract symptoms (LUTS)   . Lipoma    left hip  . Pre-diabetes    dx 02-22-2019 by pcp note in epic was given metformin;  per next note 07/ 2020 pt had stopped taking metformin   (02-03-2020  pt denies having pre-diabetes and denied ever taking medication)  . Prostate cancer [facility]) urologist--- dr winter/  oncologsit--- dr Tammi Klippel   dx [phone]  Stage T1c,  Gleason 3+4    Past Surgical History:  Procedure Laterality Date  . GOLD SEED IMPLANT N/A 02/11/2020   Procedure: GOLD SEED IMPLANT;  Surgeon: Ceasar Mons, MD;  Location: [facility];  Service: Urology;  Laterality: N/A;  . NO PAST SURGERIES    . PROSTATE BIOPSY    . SPACE OAR INSTILLATION N/A 02/11/2020   Procedure: SPACE OAR INSTILLATION;  Surgeon: Ceasar Mons, MD;  Location: [facility];  Service: Urology;  Laterality: N/A;  . TRANSRECTAL ULTRASOUND N/A 02/11/2020   Procedure: TRANSRECTAL ULTRASOUND;  Surgeon: Ceasar Mons, MD;  Location: [facility];  Service: Urology;  Laterality: N/A;  ONLY NEED 30 MIN FOR ALL PROCEDURES    Family Psychiatric History:  Sibling substance use  Family History:  Family History  Problem Relation Age of Onset  . Hypertension Mother   . Breast cancer Mother   . Hypertension Father   . Hypertension Maternal Grandmother   . Prostate cancer Neg Hx   . Colon cancer Neg Hx   . Pancreatic cancer Neg Hx     Social History:  Social History   Socioeconomic History  . Marital status: Single    Spouse name: Not on file  . Number of children: 1  . Years of education: Not on file  . Highest education level: Not on file  Occupational History  . Not on file  Tobacco Use  . Smoking status: Current Some Day Smoker     Packs/day: 0.50    Years: 40.00    Pack years: 20.00    Types: Cigarettes  . Smokeless tobacco: Never Used  . Tobacco comment: 02-03-2020 per pt 1/2 pp7d  Vaping Use  . Vaping Use: Never used  Substance and Sexual Activity  . Alcohol use: Yes    Alcohol/week: 7.0 - 14.0 standard drinks    Types: 7 - 14 Cans of beer per week    Comment: 1-2 beer daily  . Drug use: Never  . Sexual activity: Yes  Other Topics Concern  . Not on file  Social History Narrative  . Not on file   Social Determinants of Health   Financial Resource Strain: Not on file  Food Insecurity: Not on file  Transportation Needs: Not on file  Physical Activity: Not on file  Stress: Not on file  Social Connections: Not on file    Allergies:  No Known Allergies  Metabolic Disorder Labs: Lab Results  Component Value Date   HGBA1C 6.3 (H) 06/07/2019   No results found for: PROLACTIN Lab Results  Component Value Date   CHOL 189 04/19/2019   TRIG 309 (H) 04/19/2019   HDL 46 04/19/2019   CHOLHDL 4.1 04/19/2019   LDLCALC 81 04/19/2019   LDLCALC 87 02/28/2017   Lab Results  Component Value Date   TSH 1.220 02/22/2019    Therapeutic Level Labs: No results found for: LITHIUM No results found for: VALPROATE No components found for:  CBMZ  Current Medications: Current Outpatient Medications  Medication Sig Dispense Refill  . amLODipine (NORVASC) 10 MG tablet Take 1 tablet (10 mg total) by mouth daily. 90 tablet 0  . amoxicillin-clavulanate (AUGMENTIN) 875-125 MG tablet Take 1 tablet by mouth 2 (two) times daily. 20 tablet 0  . fluticasone (FLONASE) 50 MCG/ACT nasal spray Place 2 sprays into both nostrils daily. 16 g 6  . prazosin (MINIPRESS) 1 MG capsule Take 1 capsule (1 mg total) by mouth at bedtime. 30 capsule 2  . QUEtiapine (SEROQUEL) 100 MG tablet Take 1 tablet (100 mg total) by mouth at bedtime. 30 tablet 2  . sildenafil (VIAGRA) 25 MG tablet Take 4 tablets (100 mg total) by mouth daily as needed  for erectile dysfunction. 15 tablet 5   No current facility-administered medications for this visit.     Musculoskeletal: Strength & Muscle Tone: Unable to assess due to telephone visit Gait & Station: Unable to assess due to telephone visit Patient leans: N/A  Psychiatric Specialty Exam: Review of Systems  There were no vitals taken for this visit.There is no height or weight on file to calculate BMI.  General Appearance: Unable to assess due to telephone visit  Eye Contact:  Unable to assess due to telephone visit  Speech:  Clear and Coherent and Normal Rate  Volume:  Normal  Mood:  Anxious and Depressed  Affect:  Appropriate and Congruent  Thought Process:  Coherent, Goal Directed and Linear  Orientation:  Full (Time, Place, and Person)  Thought Content: WDL and Logical   Suicidal Thoughts:  No  Homicidal Thoughts:  No  Memory:  Immediate;   Good Recent;   Good Remote;   Good  Judgement:  Fair  Insight:  Fair  Psychomotor Activity:  Normal  Concentration:  Concentration: Good and Attention Span: Good  Recall:  Good  Fund of Knowledge: Good  Language: Good  Akathisia:  No  Handed:  Right  AIMS (if indicated): not done  Assets:  Communication Skills Desire for Improvement Financial Resources/Insurance Housing Social Support  ADL's:  Intact  Cognition: WNL  Sleep:  Fair   Screenings: GAD-7   Flowsheet Row Video Visit from 11/22/2020 in [place] from 10/26/2020 in [place] at [place] Visit from 06/07/2019 in Primary Care at [place] Visit from 11/12/2018 in Primary Care at [facility]  Total GAD-7 Score 17 0 9 13    PHQ2-9   Flowsheet Row Video Visit from 11/22/2020 in [place] from 10/26/2020 in [place] at [facility] Visit from 06/07/2019 in Primary Care at [place] from 11/12/2018 in [place] at [facility] Visit from 08/13/2017 in [facility]  PHQ-2 Total Score 2 0 2 1 0  PHQ-9 Total Score 14 0 10 9 --    Flowsheet Row Video Visit from  11/22/2020 in [place] No Risk       Assessment and Plan: Patient reports that he continues to have symptoms of psychosis, depression, and PTSD.  Patient reports that he was unable to start his medications after his last visit due finances.  He is agreeable to start Seroquel 100 mg to help manage his mood, VAH, and sleep. He is also agreeable to start Prazosin 1 mg nightly to help manage PTSD symptoms.   1. PTSD (post-traumatic stress disorder)  Start - prazosin (MINIPRESS) 1 MG capsule; Take 1 capsule (1 mg total) by mouth at bedtime.  Dispense: 30 capsule; Refill: 2 - Ambulatory referral to Social Work  2. Substance induced mood disorder (HCC)  Start - QUEtiapine (SEROQUEL) 100 MG tablet; Take 1 tablet (100 mg total) by mouth at bedtime.  Dispense: 30 tablet; Refill: 2 - Ambulatory referral to Social Work   Follow-up in 3 months. Follow-up with therapy as scheduled.  Salley Slaughter, NP 11/22/2020, 2:08 PM

## 2020-11-23 ENCOUNTER — Ambulatory Visit: Payer: Self-pay | Admitting: General Surgery

## 2020-12-05 ENCOUNTER — Ambulatory Visit (HOSPITAL_COMMUNITY): Payer: Medicaid Other | Admitting: Behavioral Health

## 2020-12-18 ENCOUNTER — Telehealth: Payer: Self-pay | Admitting: *Deleted

## 2020-12-18 NOTE — Telephone Encounter (Signed)
Patient called about the lung cancer screening on 11/03/2020. Referral sent to [facility]. Received message today that patient is not eligible for the lung cancer screening due to he had a prostate cancer diagnosis approximately 6 months ago. Per  Randy Form, NP he will not qualify for lung screening until he has been cancer free for 5 years. Attempted to call patient to discuss. Patient's voicemail box is full and unable to leave a message.

## 2020-12-21 ENCOUNTER — Ambulatory Visit: Payer: Self-pay | Admitting: General Surgery

## 2020-12-26 ENCOUNTER — Ambulatory Visit: Payer: Self-pay | Admitting: General Surgery

## 2020-12-28 ENCOUNTER — Ambulatory Visit: Payer: Self-pay | Admitting: General Surgery

## 2021-01-11 ENCOUNTER — Telehealth: Payer: Self-pay | Admitting: General Surgery

## 2021-01-11 ENCOUNTER — Other Ambulatory Visit: Payer: Self-pay

## 2021-01-11 ENCOUNTER — Other Ambulatory Visit: Payer: Self-pay | Admitting: Psychiatry

## 2021-01-11 ENCOUNTER — Ambulatory Visit: Payer: Self-pay | Admitting: General Surgery

## 2021-01-11 MED FILL — Quetiapine Fumarate Tab 100 MG: ORAL | 30 days supply | Qty: 30 | Fill #0 | Status: CN

## 2021-01-11 MED FILL — Quetiapine Fumarate Tab 100 MG: ORAL | 30 days supply | Qty: 30 | Fill #0 | Status: AC

## 2021-01-11 NOTE — Telephone Encounter (Signed)
Pt called after missing another appt w/Dr. Celine Ahr today requesting to reschedule.  He was advised that a conversation had been had between Dr. Celine Ahr & the schedulers in the office that he is not to be scheduled w/her again, as he has either missed or last min rescheduled the last 7 appts; therefore, taking advantage of her time in clinic that other pt's could utilize.  The pt was advised of the above direction & would still like to spk w/Dr. Celine Ahr directly & explain the diffulcty surrounding transportation &/or setting up transportation for appt's outside of [place].  He is aware that Dr. Celine Ahr is seeing pt's in clinic until 5 pm today, so any outreaches made would most likely will not be made today.  Randy Hansen acknowledged understanding & thanked.

## 2021-01-12 ENCOUNTER — Other Ambulatory Visit: Payer: Self-pay

## 2021-01-16 NOTE — Telephone Encounter (Signed)
I spoke with Mr. Randy Hansen this afternoon.  He expressed his apologies for the multiple missed appointments.  He does have a challenging social situation and I relayed to him my understanding of that.  He endorsed that he was able to get an appointment with [facility], now that he has Medicaid, and was very grateful for the information that Marzetta Board provided last week during her discussion with him.

## 2021-02-14 ENCOUNTER — Ambulatory Visit (INDEPENDENT_AMBULATORY_CARE_PROVIDER_SITE_OTHER): Payer: Medicaid Other

## 2021-02-14 ENCOUNTER — Encounter (HOSPITAL_COMMUNITY): Payer: Self-pay | Admitting: Emergency Medicine

## 2021-02-14 ENCOUNTER — Other Ambulatory Visit: Payer: Self-pay

## 2021-02-14 ENCOUNTER — Ambulatory Visit (HOSPITAL_COMMUNITY)
Admission: EM | Admit: 2021-02-14 | Discharge: 2021-02-14 | Disposition: A | Discharge status: Home / Self Care | Payer: Medicaid Other | Attending: Physician Assistant | Admitting: Physician Assistant

## 2021-02-14 DIAGNOSIS — L309 Dermatitis, unspecified: Secondary | ICD-10-CM | POA: Diagnosis not present

## 2021-02-14 DIAGNOSIS — L84 Corns and callosities: Secondary | ICD-10-CM

## 2021-02-14 DIAGNOSIS — Z8546 Personal history of malignant neoplasm of prostate: Secondary | ICD-10-CM

## 2021-02-14 DIAGNOSIS — M545 Low back pain, unspecified: Secondary | ICD-10-CM

## 2021-02-14 LAB — POCT URINALYSIS DIPSTICK, ED / UC
Bilirubin Urine: NEGATIVE
Glucose, UA: NEGATIVE mg/dL
Hgb urine dipstick: NEGATIVE
Ketones, ur: NEGATIVE mg/dL
Leukocytes,Ua: NEGATIVE
Nitrite: NEGATIVE
Protein, ur: NEGATIVE mg/dL
Specific Gravity, Urine: 1.025 (ref 1.005–1.030)
Urobilinogen, UA: 0.2 mg/dL (ref 0.0–1.0)
pH: 5 (ref 5.0–8.0)

## 2021-02-14 MED ORDER — TIZANIDINE HCL 4 MG PO TABS
4.0000 mg | ORAL_TABLET | Freq: Three times a day (TID) | ORAL | 0 refills | Status: DC
  Filled 2021-02-14: qty 30, 10d supply, fill #0

## 2021-02-14 MED ORDER — SALICYLIC ACID 17 % EX SOLN
Freq: Every day | CUTANEOUS | 0 refills | Status: AC
  Filled 2021-02-14: qty 14, fill #0

## 2021-02-14 MED ORDER — TRIAMCINOLONE ACETONIDE 0.1 % EX CREA
1.0000 "application " | TOPICAL_CREAM | Freq: Two times a day (BID) | CUTANEOUS | 0 refills | Status: DC
  Filled 2021-02-14: qty 30, 15d supply, fill #0

## 2021-02-14 NOTE — ED Provider Notes (Signed)
[facility]    CSN: [id number] Arrival date & time: 02/14/21  1713      History   Chief Complaint Chief Complaint  Patient presents with  . Back Pain    HPI Randy Hansen is a 60 y.o. male.   Patient presents today with a several month history of low back pain.  Denies known injury or increase in activity prior to symptom onset.  Reports pain is rated 8 on a 0-10 pain scale, localized to lumbar back without radiation, described as aching and periodic sharp pains, worse with bending forward or certain activities, no alleviating factors identified.  He has tried over-the-counter analgesics without pain relief.  Denies previous spinal surgery or injury.  Denies any bowel or bladder incontinence, saddle anesthesia, lower extremity weakness.  He denies any urinary symptoms but is interested in having his urine checked to ensure kidneys are not contributing to symptoms.  He does have a history of prostate cancer and underwent radiation several months ago; reports prostate cancer was stage I.  In addition, patient reports a several month history of pruritic rash on his left thigh.  Denies any change in personal hygiene products including soaps or detergents.  Denies changes to medications prior to symptom onset.  Denies history of dermatological condition has not seen dermatologist in the past.  He has tried lanolin and hydrocortisone cream without improvement of symptoms.  Denies additional areas of rash.  He is requesting medication to treat this.  In addition, patient reports a several month history of callus on his left second toe.  He has tried removing callus with razor but has been unsuccessful.  He has not tried any topical medications.  Reports associated pain and irritation.  He does have history of prediabetes but denies history of diabetes.  He has not seen podiatry and is open to referral.     Past Medical History:  Diagnosis Date  . Adrenal adenoma, right 2018  .  Cancer ([place])    [employer] 04/20/2020  . Essential hypertension    dx by pcp 02-22-2019 per note in epic was given medication    (02-03-2020  asked pt about medication for bp and stated he stopped taking long time age , he does not have any problems now,  his bp wsa high "only because I was incarcerated I could not exercise and gained weight")  . Hyperlipidemia    (02-03-2020  pt denies )  . Hyperplasia of prostate with lower urinary tract symptoms (LUTS)   . Lipoma    left hip  . Pre-diabetes    dx 02-22-2019 by pcp note in epic was given metformin;  per next note 07/ 2020 pt had stopped taking metformin   (02-03-2020  pt denies having pre-diabetes and denied ever taking medication)  . Prostate cancer [facility]) urologist--- dr winter/  oncologsit--- dr Tammi Klippel   dx [phone]  Stage T1c,  Gleason 3+4    Patient Active Problem List   Diagnosis Date Noted  . Essential hypertension 10/26/2020  . Substance induced mood disorder ([place]) 05/25/2020  . PTSD (post-traumatic stress disorder) 05/25/2020  . Malignant neoplasm of prostate ([place]) 12/14/2019  . Mass of soft tissue of hip 07/29/2019  . Renal mass 11/16/2018    Past Surgical History:  Procedure Laterality Date  . GOLD SEED IMPLANT N/A 02/11/2020   Procedure: GOLD SEED IMPLANT;  Surgeon: Ceasar Mons, MD;  Location: [facility];  Service: Urology;  Laterality: N/A;  . NO PAST  SURGERIES    . PROSTATE BIOPSY    . SPACE OAR INSTILLATION N/A 02/11/2020   Procedure: SPACE OAR INSTILLATION;  Surgeon: Ceasar Mons, MD;  Location: [facility];  Service: Urology;  Laterality: N/A;  . TRANSRECTAL ULTRASOUND N/A 02/11/2020   Procedure: TRANSRECTAL ULTRASOUND;  Surgeon: Ceasar Mons, MD;  Location: [facility];  Service: Urology;  Laterality: N/A;  ONLY NEED 30 MIN FOR ALL PROCEDURES       Home Medications    Prior to Admission medications   Medication Sig Start  Date End Date Taking? Authorizing Provider  salicylic acid-lactic acid 17 % external solution Apply topically daily. 02/14/21  Yes Pedram Goodchild K, PA-C  tiZANidine (ZANAFLEX) 4 MG capsule Take 1 capsule (4 mg total) by mouth 3 (three) times daily. 02/14/21  Yes Perl Folmar K, PA-C  triamcinolone cream (KENALOG) 0.1 % Apply 1 application topically 2 (two) times daily. 02/14/21  Yes Christeena Krogh K, PA-C  amLODipine (NORVASC) 10 MG tablet Take 1 tablet (10 mg total) by mouth daily. 10/26/20   Nicolette Bang, DO  amLODipine (NORVASC) 10 MG tablet TAKE 1 TABLET (10 MG TOTAL) BY MOUTH DAILY. 10/26/20 10/26/21  Nicolette Bang, DO  amoxicillin-clavulanate (AUGMENTIN) 875-125 MG tablet Take 1 tablet by mouth 2 (two) times daily. 10/26/20   Nicolette Bang, DO  amoxicillin-clavulanate (AUGMENTIN) 875-125 MG tablet TAKE 1 TABLET BY MOUTH 2 (TWO) TIMES DAILY. 10/26/20 10/26/21  Nicolette Bang, DO  fluticasone (FLONASE) 50 MCG/ACT nasal spray Place 2 sprays into both nostrils daily. 10/26/20   Nicolette Bang, DO  fluticasone (FLONASE) 50 MCG/ACT nasal spray PLACE 2 SPRAYS INTO BOTH NOSTRILS DAILY. 10/26/20 10/26/21  Nicolette Bang, DO  prazosin (MINIPRESS) 1 MG capsule Take 1 capsule (1 mg total) by mouth at bedtime. 11/22/20   Salley Slaughter, NP  prazosin (MINIPRESS) 1 MG capsule TAKE 1 CAPSULE (1 MG TOTAL) BY MOUTH AT BEDTIME. 11/22/20 11/22/21  Eulis Canner E, NP  QUEtiapine (SEROQUEL) 100 MG tablet Take 1 tablet (100 mg total) by mouth at bedtime. 11/22/20   Salley Slaughter, NP  QUEtiapine (SEROQUEL) 100 MG tablet TAKE 1 TABLET (100 MG TOTAL) BY MOUTH AT BEDTIME. 11/22/20 11/22/21  Eulis Canner E, NP  sildenafil (VIAGRA) 25 MG tablet Take 4 tablets (100 mg total) by mouth daily as needed for erectile dysfunction. 10/26/20   Nicolette Bang, DO  sildenafil (VIAGRA) 25 MG tablet TAKE 4 TABLETS (100 MG TOTAL) BY MOUTH DAILY AS NEEDED FOR ERECTILE  DYSFUNCTION. 10/26/20 10/26/21  Nicolette Bang, DO  metFORMIN (GLUCOPHAGE) 500 MG tablet Take 1 tablet (500 mg total) by mouth 2 (two) times daily with a meal. Patient not taking: Reported on 04/19/2019 02/25/19 06/25/19  Scot Jun, FNP    Family History Family History  Problem Relation Age of Onset  . Hypertension Mother   . Breast cancer Mother   . Hypertension Father   . Hypertension Maternal Grandmother   . Prostate cancer Neg Hx   . Colon cancer Neg Hx   . Pancreatic cancer Neg Hx     Social History Social History   Tobacco Use  . Smoking status: Current Some Day Smoker    Packs/day: 0.50    Years: 40.00    Pack years: 20.00    Types: Cigarettes  . Smokeless tobacco: Never Used  . Tobacco comment: 02-03-2020 per pt 1/2 pp7d  Vaping Use  . Vaping Use: Never used  Substance Use Topics  . Alcohol use: Yes    Alcohol/week: 7.0 - 14.0 standard drinks    Types: 7 - 14 Cans of beer per week    Comment: 1-2 beer daily  . Drug use: Never     Allergies   Patient has no known allergies.   Review of Systems Review of Systems  Constitutional: Positive for activity change. Negative for appetite change, fatigue and fever.  Respiratory: Negative for cough and shortness of breath.   Cardiovascular: Negative for chest pain.  Gastrointestinal: Negative for abdominal pain, diarrhea, nausea and vomiting.  Genitourinary: Negative for dysuria, frequency and urgency.  Musculoskeletal: Positive for back pain. Negative for arthralgias and myalgias.  Skin: Positive for rash.  Neurological: Negative for dizziness, light-headedness, numbness and headaches.     Physical Exam Triage Vital Signs ED Triage Vitals  Enc Vitals Group     BP 02/14/21 1745 (!) 118/100     Pulse Rate 02/14/21 1745 82     Resp 02/14/21 1745 18     Temp 02/14/21 1745 98.9 F (37.2 C)     Temp Source 02/14/21 1745 Oral     SpO2 02/14/21 1745 99 %     Weight --      Height --      Head  Circumference --      Peak Flow --      Pain Score 02/14/21 1743 8     Pain Loc --      Pain Edu? --      Excl. in [place]? --    No data found.  Updated Vital Signs BP (!) 118/100   Pulse 82   Temp 98.9 F (37.2 C) (Oral)   Resp 18   SpO2 99%   Visual Acuity Right Eye Distance:   Left Eye Distance:   Bilateral Distance:    Right Eye Near:   Left Eye Near:    Bilateral Near:     Physical Exam Vitals reviewed.  Constitutional:      General: He is awake.     Appearance: Normal appearance. He is normal weight. He is not ill-appearing.     Comments: Very pleasant male appears stated age in no acute distress  HENT:     Head: Normocephalic and atraumatic.     Mouth/Throat:     Pharynx: No oropharyngeal exudate or posterior oropharyngeal erythema.  Cardiovascular:     Rate and Rhythm: Normal rate and regular rhythm.     Heart sounds: No murmur heard.   Pulmonary:     Effort: Pulmonary effort is normal.     Breath sounds: Normal breath sounds. No stridor. No wheezing, rhonchi or rales.     Comments: Clear to auscultation bilaterally Abdominal:     General: Bowel sounds are normal.     Palpations: Abdomen is soft.     Tenderness: There is no abdominal tenderness. There is no right CVA tenderness, left CVA tenderness, guarding or rebound.     Comments: Benign abdominal exam  Musculoskeletal:     Cervical back: Normal. No tenderness or bony tenderness.     Thoracic back: No tenderness or bony tenderness.     Lumbar back: Tenderness and bony tenderness present. Negative right straight leg raise test and negative left straight leg raise test.     Comments: Back: Decreased range of motion with forward flexion and rotation.  Pain with percussion of lumbar vertebrae.  Tenderness palpation of bilateral lumbar paraspinal muscles.  Feet:  Left foot:     Skin integrity: Callus present.  Skin:    Findings: Rash present. Rash is macular and papular.          Comments: 4 cm x 3  cm area of maculopapular rash noted anterior left thigh with evidence of excoriation.  No bleeding or drainage noted.  No streaking or evidence of lymphangitis.  Neurological:     Mental Status: He is alert.  Psychiatric:        Behavior: Behavior is cooperative.      UC Treatments / Results  Labs (all labs ordered are listed, but only abnormal results are displayed) Labs Reviewed  POCT URINALYSIS DIPSTICK, ED / UC    EKG   Radiology DG Lumbar Spine Complete  Result Date: 02/14/2021 CLINICAL DATA:  Low back pain, intense with exertion, began 1 prior EXAM: LUMBAR SPINE - COMPLETE 4+ VIEW COMPARISON:  04/27/2020 FINDINGS: Six non-rib-bearing lumbar levels. No acute vertebral body fracture or height loss. No suspicious osseous lesions. No significant spondylolisthesis or discernible spondylolysis. Mild multilevel intervertebral disc height loss with discogenic endplate changes which are similar to comparison exam, most pronounced L4-5, L6-S1 and lower thoracic levels as included. Facet degenerative changes most significant towards the lower lumbar levels as well. Included bones of the pelvis are intact and congruent. No suspicious lytic or blastic lesions. Soft tissues are free of acute abnormality. IMPRESSION: No acute osseous abnormality. Multilevel degenerative changes, as above. Electronically Signed   By: Lovena Le M.D.   On: 02/14/2021 19:16    Procedures Procedures (including critical care time)  Medications Ordered in UC Medications - No data to display  Initial Impression / Assessment and Plan / UC Course  I have reviewed the triage vital signs and the nursing notes.  Pertinent labs & imaging results that were available during my care of the patient were reviewed by me and considered in my medical decision making (see chart for details).     UA showed no evidence of infection.  X-ray obtained given bony tenderness on exam and history of malignancy show degenerative  changes without acute findings.  Patient was started on muscle relaxer (tizanidine) with instruction not to drive or drink alcohol with this medication as drowsiness is a common side effect.  He can alternate Tylenol and ibuprofen for symptom relief.  Discussed that if symptoms persist would need to consider referral to physical therapy and/or MRI which need to be arranged through PCP.  Recommend he follow-up with PCP within 2 weeks if symptoms persist.  Strict return precautions given to which patient expressed understanding.  Patient was prescribed triamcinolone to manage dermatitis.  Recommended he use hypoallergenic soaps and detergents.  Strict return precautions given to which patient expressed understanding.  Patient prescribed salicylic acid to manage callus.  Recommended he follow-up with podiatry and was given their contact information should symptoms persist.  Strict return precautions given to which patient expressed understanding.  Final Clinical Impressions(s) / UC Diagnoses   Final diagnoses:  Acute bilateral low back pain without sciatica  History of prostate cancer  Dermatitis  Callus of foot     Discharge Instructions     Your urine was normal.  The x-ray did show some arthritis changes in your lower back but otherwise was normal.  I have called in a muscle relaxer that you can take up to 3 times a day.  You should not drive or drink alcohol with taking this as drowsiness is a common side effect.  Alternate Tylenol and ibuprofen for additional pain relief.  Follow-up with your PCP in 2 weeks if symptoms persist to consider referral to physical therapy or more advanced imaging.  Apply cream to rash twice daily.  Use hypoallergenic soaps and detergents.  If this persist please follow-up with PCP.  Use salicylic acid for callus.  If symptoms do not resolve within a few weeks please contact podiatrist to schedule appointment.    ED Prescriptions    Medication Sig Dispense  Auth. Provider   triamcinolone cream (KENALOG) 0.1 % Apply 1 application topically 2 (two) times daily. 30 g Ewin Rehberg K, PA-C   salicylic acid-lactic acid 17 % external solution Apply topically daily. 14 mL Mumtaz Lovins K, PA-C   tiZANidine (ZANAFLEX) 4 MG capsule Take 1 capsule (4 mg total) by mouth 3 (three) times daily. 30 capsule Brookelle Pellicane K, PA-C     PDMP not reviewed this encounter.   Terrilee Croak, PA-C 02/14/21 1926

## 2021-02-14 NOTE — Discharge Instructions (Addendum)
Your urine was normal.  The x-ray did show some arthritis changes in your lower back but otherwise was normal.  I have called in a muscle relaxer that you can take up to 3 times a day.  You should not drive or drink alcohol with taking this as drowsiness is a common side effect.  Alternate Tylenol and ibuprofen for additional pain relief.  Follow-up with your PCP in 2 weeks if symptoms persist to consider referral to physical therapy or more advanced imaging.  Apply cream to rash twice daily.  Use hypoallergenic soaps and detergents.  If this persist please follow-up with PCP.  Use salicylic acid for callus.  If symptoms do not resolve within a few weeks please contact podiatrist to schedule appointment.

## 2021-02-14 NOTE — ED Triage Notes (Signed)
Pt is present with lower back pain. Pt states that the pain is intense with exertion. Pt denies any urinary sx Pt states that his back pain started one months ago  Pt denies any injury. Pt also states that he has a rash on the left thigh that popped up one month ago

## 2021-02-15 ENCOUNTER — Other Ambulatory Visit: Payer: Self-pay

## 2021-02-15 ENCOUNTER — Telehealth (HOSPITAL_COMMUNITY): Payer: Medicaid Other | Admitting: Psychiatry

## 2021-02-22 ENCOUNTER — Other Ambulatory Visit: Payer: Self-pay

## 2021-03-23 ENCOUNTER — Other Ambulatory Visit: Payer: Self-pay

## 2021-03-28 ENCOUNTER — Telehealth: Payer: Self-pay | Admitting: Internal Medicine

## 2021-03-28 NOTE — Telephone Encounter (Signed)
Patient called in asking for more antibiotics for his dental issue and asking for a refill on his viagra to be sent to [place].

## 2021-03-29 NOTE — Telephone Encounter (Signed)
Pt will need to keep scheduled 7/12 appt for antibiotics

## 2021-04-02 NOTE — Progress Notes (Signed)
Patient ID: Randy Hansen, male    DOB: [birth year]  MRN: [id number]  CC: Back Pain Follow-Up   Subjective: Randy Hansen is a 60 y.o. male who presents for back pain follow-up.   His concerns today include:   BACK PAIN: Visit 02/14/2021 at [facility] per PA note: UA showed no evidence of infection.  X-ray obtained given bony tenderness on exam and history of malignancy show degenerative changes without acute findings.  Patient was started on muscle relaxer (tizanidine) with instruction not to drive or drink alcohol with this medication as drowsiness is a common side effect.  He can alternate Tylenol and ibuprofen for symptom relief.  Discussed that if symptoms persist would need to consider referral to physical therapy and/or MRI which need to be arranged through PCP.  Recommend he follow-up with PCP within 2 weeks if symptoms persist.  Strict return precautions given to which patient expressed understanding.   Patient was prescribed triamcinolone to manage dermatitis.  Recommended he use hypoallergenic soaps and detergents.  Strict return precautions given to which patient expressed understanding.   Patient prescribed salicylic acid to manage callus.  Recommended he follow-up with podiatry and was given their contact information should symptoms persist.  Strict return precautions given to which patient expressed understanding.  04/03/2021: Back pain the same. Requesting refills on muscle relaxers.   2. HYPERTENSION FOLLOW-UP: 10/26/2020 per DO note: Asymptomatic. Continue medication. Will monitor at upcoming in person visits.   04/03/2021: Doing well on current regimen. No side effects. No issues/concerns. Denies chest pain and shortness of breath.   3. DENTAL PAIN: 10/26/2020 per DO note: Discussed importance of following up with dentist.  - amoxicillin-clavulanate (AUGMENTIN)  04/03/2021: Has dentist appointment scheduled 05/02/2021. Requesting refill of  antibiotic to bridge to his dentist appointment.  4. FOOT PAIN: Bilateral feet pain. Ongoing and worsening. Seeing foot doctor the last time being 3 months ago. Was told he had bunions and given steroid injections which he feels did not help. Would like referral back to Podiatry.   Patient Active Problem List   Diagnosis Date Noted   Essential hypertension 10/26/2020   Substance induced mood disorder ([place]) 05/25/2020   PTSD (post-traumatic stress disorder) 05/25/2020   Malignant neoplasm of prostate ([place]) 12/14/2019   Mass of soft tissue of hip 07/29/2019   Renal mass 11/16/2018     Current Outpatient Medications on File Prior to Visit  Medication Sig Dispense Refill   fluticasone (FLONASE) 50 MCG/ACT nasal spray PLACE 2 SPRAYS INTO BOTH NOSTRILS DAILY. 16 g 6   prazosin (MINIPRESS) 1 MG capsule TAKE 1 CAPSULE (1 MG TOTAL) BY MOUTH AT BEDTIME. 30 capsule 2   salicylic acid-lactic acid 17 % external solution Apply topically daily. 14 mL 0   sildenafil (VIAGRA) 25 MG tablet TAKE 4 TABLETS (100 MG TOTAL) BY MOUTH DAILY AS NEEDED FOR ERECTILE DYSFUNCTION. 15 tablet 5   triamcinolone cream (KENALOG) 0.1 % Apply 1 application topically 2 (two) times daily. 30 g 0   [DISCONTINUED] metFORMIN (GLUCOPHAGE) 500 MG tablet Take 1 tablet (500 mg total) by mouth 2 (two) times daily with a meal. (Patient not taking: Reported on 04/19/2019) 180 tablet 3   No current facility-administered medications on file prior to visit.    No Known Allergies  Social History   Socioeconomic History   Marital status: Single    Spouse name: Not on file   Number of children: 1   Years of education: Not  on file   Highest education level: Not on file  Occupational History   Not on file  Tobacco Use   Smoking status: Some Days    Packs/day: 0.50    Years: 40.00    Pack years: 20.00    Types: Cigarettes   Smokeless tobacco: Never   Tobacco comments:    02-03-2020 per pt 1/2 pp7d  Vaping Use   Vaping Use:  Never used  Substance and Sexual Activity   Alcohol use: Yes    Alcohol/week: 7.0 - 14.0 standard drinks    Types: 7 - 14 Cans of beer per week    Comment: 1-2 beer daily   Drug use: Never   Sexual activity: Yes  Other Topics Concern   Not on file  Social History Narrative   Not on file   Social Determinants of Health   Financial Resource Strain: Not on file  Food Insecurity: Not on file  Transportation Needs: Not on file  Physical Activity: Not on file  Stress: Not on file  Social Connections: Not on file  Intimate Partner Violence: Not on file    Family History  Problem Relation Age of Onset   Hypertension Mother    Breast cancer Mother    Hypertension Father    Hypertension Maternal Grandmother    Prostate cancer Neg Hx    Colon cancer Neg Hx    Pancreatic cancer Neg Hx     Past Surgical History:  Procedure Laterality Date   GOLD SEED IMPLANT N/A 02/11/2020   Procedure: GOLD SEED IMPLANT;  Surgeon: Ceasar Mons, MD;  [facility];  Service: Urology;  Laterality: N/A;   NO PAST SURGERIES     PROSTATE BIOPSY     SPACE OAR INSTILLATION N/A 02/11/2020   Procedure: SPACE OAR INSTILLATION;  Surgeon: Ceasar Mons, MD;  Location: [facility];  Service: Urology;  Laterality: N/A;   TRANSRECTAL ULTRASOUND N/A 02/11/2020   Procedure: TRANSRECTAL ULTRASOUND;  Surgeon: Ceasar Mons, MD;  Location: [facility];  Service: Urology;  Laterality: N/A;  ONLY NEED 30 MIN FOR ALL PROCEDURES    ROS: Review of Systems Negative except as stated above  PHYSICAL EXAM: BP 120/86   Pulse 82   Temp 97.9 F (36.6 C)   Resp 18   Ht 5' 10.47" (1.79 m)   Wt 258 lb 9.6 oz (117.3 kg)   SpO2 96%   BMI 36.61 kg/m   Physical Exam HENT:     Head: Normocephalic and atraumatic.  Eyes:     Extraocular Movements: Extraocular movements intact.     Conjunctiva/sclera: Conjunctivae normal.     Pupils:  Pupils are equal, round, and reactive to light.  Cardiovascular:     Rate and Rhythm: Normal rate and regular rhythm.     Pulses: Normal pulses.     Heart sounds: Normal heart sounds.  Pulmonary:     Effort: Pulmonary effort is normal.     Breath sounds: Normal breath sounds.  Musculoskeletal:     Cervical back: Normal range of motion and neck supple.  Neurological:     General: No focal deficit present.     Mental Status: He is alert and oriented to person, place, and time.  Psychiatric:        Mood and Affect: Mood normal.        Behavior: Behavior normal.   ASSESSMENT AND PLAN: 1. Chronic bilateral low back pain without sciatica: - Diagnostic  lumbar spine x-ray on 02/14/2021 resulted: No acute osseous abnormality. Multilevel degenerative changes. - MRI lumbar spine for further evaluation.  - Continue Tizanidine as prescribed. May cause drowsiness. Counseled patient to not consume if operating heavy machinery or driving. Counseled patient to not consume with alcohol or illicit substances. Patient verbalized understanding.  - Follow-up with primary provider as scheduled.  - MR Lumbar Spine Wo Contrast; Future - tiZANidine (ZANAFLEX) 4 MG tablet; Take 1 tablet (4 mg total) by mouth 3 (three) times daily.  Dispense: 30 tablet; Refill: 1  2. Essential hypertension: - Blood pressure at goal during today's office visit.  - Continue Amlodipine as prescribed.  - Counseled on blood pressure goal of less than 130/80, low-sodium, DASH diet, medication compliance, 150 minutes of moderate intensity exercise per week as tolerated. Discussed medication compliance, adverse effects. - BMP to evaluate kidney function and electrolyte balance. - Follow-up with primary provider in 3 months or sooner if needed.  - Basic Metabolic Panel - amLODipine (NORVASC) 10 MG tablet; Take 1 tablet (10 mg total) by mouth daily.  Dispense: 90 tablet; Refill: 0  3. Dental infection: - Amoxicillin-Clavulanate as  prescribed.  - Keep appointment with Dentistry scheduled 05/02/2021. - amoxicillin-clavulanate (AUGMENTIN) 875-125 MG tablet; Take 1 tablet by mouth 2 (two) times daily for 10 days.  Dispense: 20 tablet; Refill: 0  4. Chronic pain of both feet: - Per patient request referral to Podiatry for further evaluation and management. - Ambulatory referral to Podiatry  Patient was given the opportunity to ask questions.  Patient verbalized understanding of the plan and was able to repeat key elements of the plan. Patient was given clear instructions to go to Emergency Department or return to medical center if symptoms don't improve, worsen, or new problems develop.The patient verbalized understanding.   Orders Placed This Encounter  Procedures   MR Lumbar Spine Wo Contrast   Basic Metabolic Panel   Ambulatory referral to Podiatry     Requested Prescriptions   Signed Prescriptions Disp Refills   amoxicillin-clavulanate (AUGMENTIN) 875-125 MG tablet 20 tablet 0    Sig: Take 1 tablet by mouth 2 (two) times daily for 10 days.   tiZANidine (ZANAFLEX) 4 MG tablet 30 tablet 1    Sig: Take 1 tablet (4 mg total) by mouth 3 (three) times daily.   amLODipine (NORVASC) 10 MG tablet 90 tablet 0    Sig: Take 1 tablet (10 mg total) by mouth daily.    Return for Physical per patient preference.  Camillia Herter, NP

## 2021-04-03 ENCOUNTER — Other Ambulatory Visit: Payer: Self-pay

## 2021-04-03 ENCOUNTER — Ambulatory Visit (INDEPENDENT_AMBULATORY_CARE_PROVIDER_SITE_OTHER): Payer: Medicaid Other | Admitting: Family

## 2021-04-03 VITALS — BP 120/86 | HR 82 | Temp 97.9°F | Resp 18 | Ht 70.47 in | Wt 258.6 lb

## 2021-04-03 DIAGNOSIS — M79671 Pain in right foot: Secondary | ICD-10-CM

## 2021-04-03 DIAGNOSIS — M545 Low back pain, unspecified: Secondary | ICD-10-CM

## 2021-04-03 DIAGNOSIS — G8929 Other chronic pain: Secondary | ICD-10-CM

## 2021-04-03 DIAGNOSIS — K047 Periapical abscess without sinus: Secondary | ICD-10-CM

## 2021-04-03 DIAGNOSIS — N529 Male erectile dysfunction, unspecified: Secondary | ICD-10-CM

## 2021-04-03 DIAGNOSIS — I10 Essential (primary) hypertension: Secondary | ICD-10-CM

## 2021-04-03 DIAGNOSIS — M79672 Pain in left foot: Secondary | ICD-10-CM

## 2021-04-03 MED ORDER — AMOXICILLIN-POT CLAVULANATE 875-125 MG PO TABS
1.0000 | ORAL_TABLET | Freq: Two times a day (BID) | ORAL | 0 refills | Status: AC
  Filled 2021-04-03: qty 20, 10d supply, fill #0

## 2021-04-03 MED ORDER — TIZANIDINE HCL 4 MG PO TABS
4.0000 mg | ORAL_TABLET | Freq: Three times a day (TID) | ORAL | 1 refills | Status: AC
  Filled 2021-04-03: qty 30, 10d supply, fill #0
  Filled 2021-05-10: qty 30, 10d supply, fill #1

## 2021-04-03 NOTE — Progress Notes (Signed)
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  Needs refill on amlodipine and amoxicillin due to infection in mouth  Denstist appt 8/10

## 2021-04-04 ENCOUNTER — Telehealth (HOSPITAL_COMMUNITY): Payer: Self-pay

## 2021-04-04 ENCOUNTER — Other Ambulatory Visit: Payer: Self-pay

## 2021-04-04 ENCOUNTER — Other Ambulatory Visit (HOSPITAL_COMMUNITY): Payer: Self-pay | Admitting: Psychiatry

## 2021-04-04 LAB — BASIC METABOLIC PANEL
BUN/Creatinine Ratio: 17 (ref 9–20)
BUN: 16 mg/dL (ref 6–24)
CO2: 24 mmol/L (ref 20–29)
Calcium: 9.6 mg/dL (ref 8.7–10.2)
Chloride: 103 mmol/L (ref 96–106)
Creatinine, Ser: 0.96 mg/dL (ref 0.76–1.27)
Glucose: 94 mg/dL (ref 65–99)
Potassium: 4 mmol/L (ref 3.5–5.2)
Sodium: 141 mmol/L (ref 134–144)
eGFR: 92 mL/min/{1.73_m2} (ref >59)

## 2021-04-04 MED ORDER — QUETIAPINE FUMARATE 100 MG PO TABS
ORAL_TABLET | Freq: Every day | ORAL | 2 refills | Status: DC
  Filled 2021-04-04 – 2021-12-11 (×11): qty 30, 30d supply, fill #0

## 2021-04-04 MED FILL — Prazosin HCl Cap 1 MG: ORAL | 30 days supply | Qty: 30 | Fill #0 | Status: AC

## 2021-04-04 NOTE — Progress Notes (Signed)
Kidney function normal

## 2021-04-04 NOTE — Telephone Encounter (Addendum)
Patient called the [facility] office requesting a refill on his Quetiapine 100mg . He wants it sent to [facility]. Please review and advise. Thank you  Notified patient

## 2021-04-04 NOTE — Telephone Encounter (Signed)
Medication refilled and sent to preferred pharmacy

## 2021-04-05 ENCOUNTER — Other Ambulatory Visit: Payer: Self-pay

## 2021-04-05 MED ORDER — AMLODIPINE BESYLATE 10 MG PO TABS
10.0000 mg | ORAL_TABLET | Freq: Every day | ORAL | 0 refills | Status: DC
  Filled 2021-04-05: qty 90, 90d supply, fill #0

## 2021-04-09 ENCOUNTER — Other Ambulatory Visit: Payer: Self-pay

## 2021-04-11 ENCOUNTER — Other Ambulatory Visit: Payer: Self-pay

## 2021-04-12 ENCOUNTER — Other Ambulatory Visit: Payer: Self-pay

## 2021-04-23 ENCOUNTER — Other Ambulatory Visit: Payer: Self-pay

## 2021-04-23 NOTE — Progress Notes (Signed)
Patient did not show for appointment.   

## 2021-04-24 ENCOUNTER — Encounter: Payer: Medicaid Other | Admitting: Family

## 2021-04-24 ENCOUNTER — Ambulatory Visit: Payer: No Typology Code available for payment source | Admitting: Podiatry

## 2021-04-24 DIAGNOSIS — Z131 Encounter for screening for diabetes mellitus: Secondary | ICD-10-CM

## 2021-04-24 DIAGNOSIS — Z1211 Encounter for screening for malignant neoplasm of colon: Secondary | ICD-10-CM

## 2021-04-24 DIAGNOSIS — Z0000 Encounter for general adult medical examination without abnormal findings: Secondary | ICD-10-CM

## 2021-04-24 DIAGNOSIS — Z13220 Encounter for screening for lipoid disorders: Secondary | ICD-10-CM

## 2021-04-24 DIAGNOSIS — Z1329 Encounter for screening for other suspected endocrine disorder: Secondary | ICD-10-CM

## 2021-04-24 DIAGNOSIS — Z13228 Encounter for screening for other metabolic disorders: Secondary | ICD-10-CM

## 2021-04-24 DIAGNOSIS — Z130 Encounter for screening for diseases of the blood and blood-forming organs and certain disorders involving the immune mechanism: Secondary | ICD-10-CM

## 2021-04-25 ENCOUNTER — Telehealth (HOSPITAL_COMMUNITY): Payer: Self-pay | Admitting: *Deleted

## 2021-04-25 ENCOUNTER — Other Ambulatory Visit: Payer: Self-pay

## 2021-04-25 NOTE — Telephone Encounter (Signed)
Prior authorization obtained for patients Seroquel. Notified his pharmacy and him it had been done so he can pick up his meds. PA [id number] and it is valid till 04/20/22.

## 2021-04-25 NOTE — Telephone Encounter (Signed)
Call from patient stating he needs his Seroquel . [facility] should be able to pick up. He states he knows he has medicine there but they say they cant give it to him. He doesn't have a future appt here and hasnt been seen since March. Transferred his call to the front desk to make a future appt, will contact [facility] to follow up on the medicine and call him back with the information.

## 2021-05-02 ENCOUNTER — Other Ambulatory Visit: Payer: Self-pay

## 2021-05-08 ENCOUNTER — Other Ambulatory Visit: Payer: Self-pay

## 2021-05-10 ENCOUNTER — Other Ambulatory Visit: Payer: Self-pay

## 2021-05-10 MED FILL — Prazosin HCl Cap 1 MG: ORAL | 30 days supply | Qty: 30 | Fill #1 | Status: CN

## 2021-05-11 ENCOUNTER — Other Ambulatory Visit: Payer: Self-pay

## 2021-05-17 ENCOUNTER — Other Ambulatory Visit: Payer: Self-pay

## 2021-05-31 ENCOUNTER — Other Ambulatory Visit: Payer: Self-pay

## 2021-05-31 ENCOUNTER — Telehealth: Payer: Self-pay | Admitting: *Deleted

## 2021-05-31 MED FILL — Sildenafil Citrate Tab 25 MG: ORAL | 30 days supply | Qty: 5 | Fill #0 | Status: CN

## 2021-05-31 NOTE — Telephone Encounter (Signed)
RETURNED PATIENT'S PHONE CALL, LVM FOR A RETURN CALL 

## 2021-06-05 ENCOUNTER — Telehealth (HOSPITAL_COMMUNITY): Payer: Medicaid Other | Admitting: Psychiatry

## 2021-06-07 ENCOUNTER — Other Ambulatory Visit: Payer: Self-pay

## 2021-06-11 ENCOUNTER — Other Ambulatory Visit: Payer: Self-pay

## 2021-06-12 ENCOUNTER — Other Ambulatory Visit: Payer: Self-pay

## 2021-06-19 ENCOUNTER — Telehealth (HOSPITAL_COMMUNITY): Payer: Medicaid Other | Admitting: Psychiatry

## 2021-07-02 ENCOUNTER — Encounter: Payer: Self-pay | Admitting: General Surgery

## 2021-07-13 ENCOUNTER — Telehealth: Payer: Self-pay | Admitting: *Deleted

## 2021-07-13 NOTE — Telephone Encounter (Signed)
RETURNED PATIENT'S PHONE CALL, SPOKE WITH PATIENT. ?

## 2021-09-10 NOTE — Progress Notes (Signed)
Patient ID: Randy Hansen, male    DOB: 1960/10/21  MRN: [id number]  CC: Chest Congestion   Subjective: Randy Hansen is a 60 y.o. male who presents for chest congestion.   His concerns today include:  Reports chest congestion for 3 days. Productive cough of brown sputum, no blood. Sinus pressure. Reports recent shortness of breath with activity. Denies chest pain and additional red flag symptoms. Taking Theraflu over-the-counter without relief. History of smoking since 60 years-old, plans to return for lung cancer screening with annual physical exam. Has not taken blood pressure medication today as of present. History of prostate cancer and requesting new Urology referral that accepts his health insurance. Requesting refill of Viagra, no issues/concerns.   Patient Active Problem List   Diagnosis Date Noted   Essential hypertension 10/26/2020   Substance induced mood disorder ([place]) 05/25/2020   PTSD (post-traumatic stress disorder) 05/25/2020   Malignant neoplasm of prostate ([place]) 12/14/2019   Mass of soft tissue of hip 07/29/2019   Renal mass 11/16/2018     Current Outpatient Medications on File Prior to Visit  Medication Sig Dispense Refill   amLODipine (NORVASC) 10 MG tablet Take 1 tablet (10 mg total) by mouth daily. 90 tablet 0   fluticasone (FLONASE) 50 MCG/ACT nasal spray PLACE 2 SPRAYS INTO BOTH NOSTRILS DAILY. 16 g 6   prazosin (MINIPRESS) 1 MG capsule TAKE 1 CAPSULE (1 MG TOTAL) BY MOUTH AT BEDTIME. 30 capsule 2   QUEtiapine (SEROQUEL) 100 MG tablet TAKE 1 TABLET (100 MG TOTAL) BY MOUTH AT BEDTIME. 30 tablet 2   salicylic acid-lactic acid 17 % external solution Apply topically daily. 14 mL 0   tiZANidine (ZANAFLEX) 4 MG tablet Take 1 tablet (4 mg total) by mouth 3 (three) times daily. 30 tablet 1   triamcinolone cream (KENALOG) 0.1 % Apply 1 application topically 2 (two) times daily. 30 g 0   [DISCONTINUED] metFORMIN (GLUCOPHAGE) 500 MG tablet Take 1 tablet (500 mg  total) by mouth 2 (two) times daily with a meal. (Patient not taking: Reported on 04/19/2019) 180 tablet 3   No current facility-administered medications on file prior to visit.    No Known Allergies  Social History   Socioeconomic History   Marital status: Single    Spouse name: Not on file   Number of children: 1   Years of education: Not on file   Highest education level: Not on file  Occupational History   Not on file  Tobacco Use   Smoking status: Some Days    Packs/day: 0.50    Years: 40.00    Pack years: 20.00    Types: Cigarettes   Smokeless tobacco: Never   Tobacco comments:    02-03-2020 per pt 1/2 pp7d  Vaping Use   Vaping Use: Never used  Substance and Sexual Activity   Alcohol use: Yes    Alcohol/week: 7.0 - 14.0 standard drinks    Types: 7 - 14 Cans of beer per week    Comment: 1-2 beer daily   Drug use: Never   Sexual activity: Yes  Other Topics Concern   Not on file  Social History Narrative   Not on file   Social Determinants of Health   Financial Resource Strain: Not on file  Food Insecurity: Not on file  Transportation Needs: Not on file  Physical Activity: Not on file  Stress: Not on file  Social Connections: Not on file  Intimate Partner Violence: Not on file  Family History  Problem Relation Age of Onset   Hypertension Mother    Breast cancer Mother    Hypertension Father    Hypertension Maternal Grandmother    Prostate cancer Neg Hx    Colon cancer Neg Hx    Pancreatic cancer Neg Hx     Past Surgical History:  Procedure Laterality Date   GOLD SEED IMPLANT N/A 02/11/2020   Procedure: GOLD SEED IMPLANT;  Surgeon: Ceasar Mons, MD;  [facility];  Service: Urology;  Laterality: N/A;   NO PAST SURGERIES     PROSTATE BIOPSY     SPACE OAR INSTILLATION N/A 02/11/2020   Procedure: SPACE OAR INSTILLATION;  Surgeon: Ceasar Mons, MD;  Location: [facility];  Service:  Urology;  Laterality: N/A;   TRANSRECTAL ULTRASOUND N/A 02/11/2020   Procedure: TRANSRECTAL ULTRASOUND;  Surgeon: Ceasar Mons, MD;  Location: [facility];  Service: Urology;  Laterality: N/A;  ONLY NEED 30 MIN FOR ALL PROCEDURES    ROS: Review of Systems Negative except as stated above  PHYSICAL EXAM: BP (!) 155/94 (BP Location: Left Arm, Patient Position: Sitting, Cuff Size: Normal)    Pulse 84    Temp 98.8 F (37.1 C)    Resp 18    Ht 5' 10.47" (1.79 m)    Wt 253 lb 12.8 oz (115.1 kg)    SpO2 91%    BMI 35.93 kg/m   Physical Exam HENT:     Head: Normocephalic and atraumatic.  Eyes:     Extraocular Movements: Extraocular movements intact.     Conjunctiva/sclera: Conjunctivae normal.     Pupils: Pupils are equal, round, and reactive to light.  Cardiovascular:     Rate and Rhythm: Normal rate and regular rhythm.  Pulmonary:     Effort: Pulmonary effort is normal.     Breath sounds: Normal breath sounds.  Musculoskeletal:     Cervical back: Normal range of motion and neck supple.  Neurological:     General: No focal deficit present.     Mental Status: He is alert and oriented to person, place, and time.  Psychiatric:        Mood and Affect: Mood normal.        Behavior: Behavior normal.   ASSESSMENT AND PLAN: 1. Acute non-recurrent frontal sinusitis: - Amoxicillin-Clavulanate and Prednisone as prescribed. - Respiratory panel to screen for possible Covid, Influenza, and RSV.  - Follow-up with primary provider as scheduled.  - amoxicillin-clavulanate (AUGMENTIN) 875-125 MG tablet; Take 1 tablet by mouth 2 (two) times daily.  Dispense: 20 tablet; Refill: 0 - predniSONE (DELTASONE) 10 MG tablet; Take 6 tablets (60 mg total) by mouth daily with breakfast for 1 day, THEN 5 tablets (50 mg total) daily with breakfast for 1 day, THEN 4 tablets (40 mg total) daily with breakfast for 1 day, THEN 3 tablets (30 mg total) daily with breakfast for 1 day, THEN 2  tablets (20 mg total) daily with breakfast for 1 day, THEN 1 tablet (10 mg total) daily with breakfast for 1 day.  Dispense: 21 tablet; Refill: 0 - COVID-19, Flu A+B and RSV  2. Dyspnea on exertion: - Patient today in office without cardiopulmonary distress.  - Diagnostic chest x-ray for further evaluation.  - BMP to evaluate kidney function and electrolyte balance. - CBC to screen for anemia. - Follow-up with primary provider as scheduled. - DG Chest 2 View; Future - Basic Metabolic Panel - CBC  3. Erectile dysfunction, unspecified erectile dysfunction type: - Continue Sildenafil as prescribed. - Follow-up with primary provider as scheduled. - sildenafil (VIAGRA) 25 MG tablet; Take 1 tablet (25 mg total) 1/2 hour to 1 hour prior to intercourse as needed. Limit use to 1/2 tablet or 1 tablet per 24 hours.  Dispense: 30 tablet; Refill: 1  4. History of prostate cancer: - Per patient request updated referral to Urology for further evaluation and management.  - Ambulatory referral to Urology   Patient was given the opportunity to ask questions.  Patient verbalized understanding of the plan and was able to repeat key elements of the plan. Patient was given clear instructions to go to Emergency Department or return to medical center if symptoms don't improve, worsen, or new problems develop.The patient verbalized understanding.   Orders Placed This Encounter  Procedures   COVID-19, Flu A+B and RSV   DG Chest 2 View   Basic Metabolic Panel   CBC   Ambulatory referral to Urology     Requested Prescriptions   Signed Prescriptions Disp Refills   amoxicillin-clavulanate (AUGMENTIN) 875-125 MG tablet 20 tablet 0    Sig: Take 1 tablet by mouth 2 (two) times daily.   sildenafil (VIAGRA) 25 MG tablet 30 tablet 1    Sig: Take 1 tablet (25 mg total) 1/2 hour to 1 hour prior to intercourse as needed. Limit use to 1/2 tablet or 1 tablet per 24 hours.   predniSONE (DELTASONE) 10 MG tablet 21  tablet 0    Sig: Take 6 tablets (60 mg total) by mouth daily with breakfast for 1 day, THEN 5 tablets (50 mg total) daily with breakfast for 1 day, THEN 4 tablets (40 mg total) daily with breakfast for 1 day, THEN 3 tablets (30 mg total) daily with breakfast for 1 day, THEN 2 tablets (20 mg total) daily with breakfast for 1 day, THEN 1 tablet (10 mg total) daily with breakfast for 1 day.    Return for Physical per patient preference with Dorna Mai, MD .  Camillia Herter, NP

## 2021-09-12 ENCOUNTER — Ambulatory Visit (INDEPENDENT_AMBULATORY_CARE_PROVIDER_SITE_OTHER): Payer: Medicaid Other | Admitting: Family

## 2021-09-12 ENCOUNTER — Other Ambulatory Visit: Payer: Self-pay

## 2021-09-12 VITALS — BP 155/94 | HR 84 | Temp 98.8°F | Resp 18 | Ht 70.47 in | Wt 253.8 lb

## 2021-09-12 DIAGNOSIS — N529 Male erectile dysfunction, unspecified: Secondary | ICD-10-CM

## 2021-09-12 DIAGNOSIS — J0110 Acute frontal sinusitis, unspecified: Secondary | ICD-10-CM

## 2021-09-12 DIAGNOSIS — Z8546 Personal history of malignant neoplasm of prostate: Secondary | ICD-10-CM | POA: Diagnosis not present

## 2021-09-12 DIAGNOSIS — R0609 Other forms of dyspnea: Secondary | ICD-10-CM

## 2021-09-12 MED ORDER — SILDENAFIL CITRATE 25 MG PO TABS
ORAL_TABLET | ORAL | 1 refills | Status: DC
  Filled 2021-09-12: qty 10, 30d supply, fill #0
  Filled 2021-09-20: qty 30, 30d supply, fill #0

## 2021-09-12 MED ORDER — AMOXICILLIN-POT CLAVULANATE 875-125 MG PO TABS
1.0000 | ORAL_TABLET | Freq: Two times a day (BID) | ORAL | 0 refills | Status: DC
  Filled 2021-09-12 – 2021-09-20 (×2): qty 20, 10d supply, fill #0

## 2021-09-12 MED ORDER — PREDNISONE 10 MG PO TABS
ORAL_TABLET | ORAL | 0 refills | Status: AC
  Filled 2021-09-12 – 2021-09-20 (×2): qty 21, 6d supply, fill #0

## 2021-09-12 NOTE — Progress Notes (Signed)
Pt presents for chest congestion and sinus pressure started about 3 days ago  Needs refill on Viagra  Would like referral to new urologist

## 2021-09-13 LAB — COVID-19, FLU A+B AND RSV
Influenza A, NAA: NOT DETECTED
Influenza B, NAA: NOT DETECTED
RSV, NAA: NOT DETECTED
SARS-CoV-2, NAA: NOT DETECTED

## 2021-09-13 LAB — BASIC METABOLIC PANEL
BUN/Creatinine Ratio: 16 (ref 9–20)
BUN: 16 mg/dL (ref 6–24)
CO2: 24 mmol/L (ref 20–29)
Calcium: 9.8 mg/dL (ref 8.7–10.2)
Chloride: 100 mmol/L (ref 96–106)
Creatinine, Ser: 1.03 mg/dL (ref 0.76–1.27)
Glucose: 89 mg/dL (ref 70–99)
Potassium: 4.1 mmol/L (ref 3.5–5.2)
Sodium: 140 mmol/L (ref 134–144)
eGFR: 84 mL/min/{1.73_m2} (ref >59)

## 2021-09-13 LAB — CBC
Hematocrit: 43.8 % (ref 37.5–51.0)
Hemoglobin: 15 g/dL (ref 13.0–17.7)
MCH: 31.3 pg (ref 26.6–33.0)
MCHC: 34.2 g/dL (ref 31.5–35.7)
MCV: 91 fL (ref 79–97)
Platelets: 284 10*3/uL (ref 150–450)
RBC: 4.8 x10E6/uL (ref 4.14–5.80)
RDW: 12.9 % (ref 11.6–15.4)
WBC: 5.3 10*3/uL (ref 3.4–10.8)

## 2021-09-13 NOTE — Progress Notes (Signed)
Please call patient with update.   Kidney function normal.  No anemia.

## 2021-09-13 NOTE — Progress Notes (Signed)
Please call patient with update.   Covid, Flu, RSV negative.

## 2021-09-19 ENCOUNTER — Other Ambulatory Visit: Payer: Self-pay

## 2021-09-20 ENCOUNTER — Other Ambulatory Visit: Payer: Self-pay

## 2021-09-27 ENCOUNTER — Telehealth: Payer: Self-pay | Admitting: Family Medicine

## 2021-09-27 NOTE — Telephone Encounter (Signed)
Rx #: [id number]  amLODipine (NORVASC) 10 MG tablet [[id number]]  ENDED   [place]

## 2021-09-28 ENCOUNTER — Other Ambulatory Visit: Payer: Self-pay | Admitting: *Deleted

## 2021-09-28 ENCOUNTER — Other Ambulatory Visit: Payer: Self-pay

## 2021-09-28 DIAGNOSIS — I10 Essential (primary) hypertension: Secondary | ICD-10-CM

## 2021-09-28 MED ORDER — AMLODIPINE BESYLATE 10 MG PO TABS
10.0000 mg | ORAL_TABLET | Freq: Every day | ORAL | 0 refills | Status: DC
  Filled 2021-09-28: qty 30, 30d supply, fill #0

## 2021-09-28 NOTE — Telephone Encounter (Signed)
Patient given 30 days until appt on 10/15/2021 Blood pressure need to be check

## 2021-10-04 ENCOUNTER — Ambulatory Visit: Payer: Medicaid Other | Admitting: Family Medicine

## 2021-10-05 ENCOUNTER — Ambulatory Visit: Payer: Medicaid Other | Admitting: Nurse Practitioner

## 2021-10-08 ENCOUNTER — Other Ambulatory Visit: Payer: Self-pay

## 2021-10-08 ENCOUNTER — Telehealth: Payer: Self-pay | Admitting: Family Medicine

## 2021-10-08 ENCOUNTER — Other Ambulatory Visit: Payer: Self-pay | Admitting: *Deleted

## 2021-10-08 DIAGNOSIS — I10 Essential (primary) hypertension: Secondary | ICD-10-CM

## 2021-10-08 MED ORDER — AMLODIPINE BESYLATE 10 MG PO TABS
10.0000 mg | ORAL_TABLET | Freq: Every day | ORAL | 0 refills | Status: DC
  Filled 2021-10-15: qty 30, 30d supply, fill #0

## 2021-10-08 NOTE — Telephone Encounter (Signed)
Rx #: [id number]  amLODipine (NORVASC) 10 MG tablet [[id number]]   Rx #: [id number]  sildenafil (VIAGRA) 25 MG tablet [facility] [address], [place], [place] [address]  Phone:  [phone]  Fax:  [phone]  DEA #:  [id number]

## 2021-10-08 NOTE — Telephone Encounter (Signed)
Patient given enough pills until his appt on 10/18/2021

## 2021-10-11 ENCOUNTER — Other Ambulatory Visit: Payer: Self-pay

## 2021-10-15 ENCOUNTER — Other Ambulatory Visit: Payer: Self-pay

## 2021-10-18 ENCOUNTER — Ambulatory Visit (INDEPENDENT_AMBULATORY_CARE_PROVIDER_SITE_OTHER): Payer: Medicaid Other | Admitting: Family Medicine

## 2021-10-18 ENCOUNTER — Other Ambulatory Visit: Payer: Self-pay

## 2021-10-18 VITALS — BP 147/97 | HR 90 | Temp 98.0°F | Resp 18 | Wt 265.4 lb

## 2021-10-18 DIAGNOSIS — N529 Male erectile dysfunction, unspecified: Secondary | ICD-10-CM | POA: Diagnosis not present

## 2021-10-18 DIAGNOSIS — M545 Low back pain, unspecified: Secondary | ICD-10-CM | POA: Diagnosis not present

## 2021-10-18 DIAGNOSIS — L309 Dermatitis, unspecified: Secondary | ICD-10-CM | POA: Diagnosis not present

## 2021-10-18 MED ORDER — TRIAMCINOLONE ACETONIDE 40 MG/ML IJ SUSP
40.0000 mg | Freq: Once | INTRAMUSCULAR | Status: AC
  Administered 2021-10-18: 40 mg via INTRAMUSCULAR

## 2021-10-18 MED ORDER — TRIAMCINOLONE ACETONIDE 0.1 % EX CREA
1.0000 "application " | TOPICAL_CREAM | Freq: Two times a day (BID) | CUTANEOUS | 1 refills | Status: AC
  Filled 2021-10-18: qty 30, 15d supply, fill #0
  Filled 2021-11-05: qty 60, 30d supply, fill #0
  Filled 2022-08-12: qty 60, 30d supply, fill #1

## 2021-10-18 MED ORDER — CYCLOBENZAPRINE HCL 10 MG PO TABS
10.0000 mg | ORAL_TABLET | Freq: Three times a day (TID) | ORAL | 2 refills | Status: DC | PRN
  Filled 2021-10-18 – 2021-11-05 (×2): qty 60, 20d supply, fill #0

## 2021-10-18 MED ORDER — SILDENAFIL CITRATE 25 MG PO TABS: ORAL_TABLET | ORAL | 1 refills | Status: DC

## 2021-10-18 NOTE — Progress Notes (Signed)
Patient is here for CPE. Patient c/o lower back pain 10/10. Patient has been taking medication with no relief. Patient has rash on left leg and would like some cream to help with that Patient would like refill on medications Patient c/o SOB sometimes when he moving around

## 2021-10-19 ENCOUNTER — Other Ambulatory Visit: Payer: Self-pay | Admitting: *Deleted

## 2021-10-19 MED ORDER — TADALAFIL 20 MG PO TABS: ORAL_TABLET | ORAL | 0 refills | Status: AC

## 2021-10-22 ENCOUNTER — Encounter: Payer: Self-pay | Admitting: Family Medicine

## 2021-10-22 NOTE — Progress Notes (Signed)
Established Patient Office Visit  Subjective:  Patient ID: Randy Hansen, male    DOB: [birth year]  Age: 61 y.o. MRN: [id number]  CC:  Chief Complaint  Patient presents with   Annual Exam    HPI [name] presents for complaint of lower back pain and rash. He denies known recent trauma or injury. He reports spasms accompany the back pain. He denies known exposure or contacts that would have caused the rash.  Past Medical History:  Diagnosis Date   Adrenal adenoma, right 2018   Cancer [facility])    [employer] 04/20/2020   Essential hypertension    dx by pcp 02-22-2019 per note in epic was given medication    (02-03-2020  asked pt about medication for bp and stated he stopped taking long time age , he does not have any problems now,  his bp wsa high "only because I was incarcerated I could not exercise and gained weight")   Hyperlipidemia    (02-03-2020  pt denies )   Hyperplasia of prostate with lower urinary tract symptoms (LUTS)    Lipoma    left hip   Pre-diabetes    dx 02-22-2019 by pcp note in epic was given metformin;  per next note 07/ 2020 pt had stopped taking metformin   (02-03-2020  pt denies having pre-diabetes and denied ever taking medication)   Prostate cancer [facility]) urologist--- dr winter/  oncologsit--- dr manning   dx [phone]  Stage T1c,  Gleason 3+4    Past Surgical History:  Procedure Laterality Date   GOLD SEED IMPLANT N/A 02/11/2020   Procedure: GOLD SEED IMPLANT;  Surgeon: Ceasar Mons, MD;  Location: [facility];  Service: Urology;  Laterality: N/A;   NO PAST SURGERIES     PROSTATE BIOPSY     SPACE OAR INSTILLATION N/A 02/11/2020   Procedure: SPACE OAR INSTILLATION;  Surgeon: Ceasar Mons, MD;  Location: [facility];  Service: Urology;  Laterality: N/A;   TRANSRECTAL ULTRASOUND N/A 02/11/2020   Procedure: TRANSRECTAL ULTRASOUND;  Surgeon: Ceasar Mons, MD;  Location: [facility];  Service: Urology;  Laterality: N/A;  ONLY NEED 15 MIN FOR ALL PROCEDURES    Family History  Problem Relation Age of Onset   Hypertension Mother    Breast cancer Mother    Hypertension Father    Hypertension Maternal Grandmother    Prostate cancer Neg Hx    Colon cancer Neg Hx    Pancreatic cancer Neg Hx     Social History   Socioeconomic History   Marital status: Single    Spouse name: Not on file   Number of children: 1   Years of education: Not on file   Highest education level: Not on file  Occupational History   Not on file  Tobacco Use   Smoking status: Some Days    Packs/day: 0.50    Years: 40.00    Pack years: 20.00    Types: Cigarettes   Smokeless tobacco: Never   Tobacco comments:    02-03-2020 per pt 1/2 pp7d  Vaping Use   Vaping Use: Never used  Substance and Sexual Activity   Alcohol use: Yes    Alcohol/week: 7.0 - 14.0 standard drinks    Types: 7 - 14 Cans of beer per week    Comment: 1-2 beer daily   Drug use: Never   Sexual activity: Yes  Other Topics Concern   Not on file  Social History Narrative   Not on file   Social Determinants of Health   Financial Resource Strain: Not on file  Food Insecurity: Not on file  Transportation Needs: Not on file  Physical Activity: Not on file  Stress: Not on file  Social Connections: Not on file  Intimate Partner Violence: Not on file    ROS Review of Systems  Constitutional:  Negative for chills and fever.  Musculoskeletal:  Positive for back pain.  Skin:  Positive for rash.  All other systems reviewed and are negative.  Objective:   Today's Vitals: BP (!) 147/97    Pulse 90    Temp 98 F (36.7 C) (Oral)    Resp 18    Wt 265 lb 6.4 oz (120.4 kg)    SpO2 96%    BMI 37.57 kg/m   Physical Exam Vitals and nursing note reviewed.  Constitutional:      General: He is not in acute distress. Cardiovascular:     Rate and Rhythm: Normal rate and regular rhythm.  Pulmonary:      Effort: Pulmonary effort is normal.     Breath sounds: Normal breath sounds.  Abdominal:     Palpations: Abdomen is soft.     Tenderness: There is no abdominal tenderness.  Musculoskeletal:     Lumbar back: Spasms and tenderness present. Decreased range of motion.  Skin:    Findings: Rash present.  Neurological:     General: No focal deficit present.     Mental Status: He is alert and oriented to person, place, and time.    Assessment & Plan:   1. Midline low back pain, unspecified chronicity, unspecified whether sciatica present Kenalog IM injection given. Tylenol/nsaids/topical preps prn.   - triamcinolone cream (KENALOG) 0.1 %; Apply 1 application topically 2 (two) times daily.  Dispense: 80 g; Refill: 1 - triamcinolone acetonide (KENALOG-40) injection 40 mg  2. Eczema, unspecified type Triamcinolone cream prescribed. Skin care discussed.   3. Erectile dysfunction, unspecified erectile dysfunction type Viagra prescribed    Outpatient Encounter Medications as of 10/18/2021  Medication Sig   amLODipine (NORVASC) 10 MG tablet Take 1 tablet (10 mg total) by mouth daily.   cyclobenzaprine (FLEXERIL) 10 MG tablet Take 1 tablet (10 mg total) by mouth 3 (three) times daily as needed for muscle spasms.   fluticasone (FLONASE) 50 MCG/ACT nasal spray PLACE 2 SPRAYS INTO BOTH NOSTRILS DAILY.   fluticasone (FLONASE) 50 MCG/ACT nasal spray Place into the nose.   prazosin (MINIPRESS) 1 MG capsule TAKE 1 CAPSULE (1 MG TOTAL) BY MOUTH AT BEDTIME.   QUEtiapine (SEROQUEL) 100 MG tablet TAKE 1 TABLET (100 MG TOTAL) BY MOUTH AT BEDTIME.   Salicylic Acid 17 % KIT Apply topically.   salicylic acid-lactic acid 17 % external solution Apply topically daily.   tiZANidine (ZANAFLEX) 4 MG tablet Take 1 tablet (4 mg total) by mouth 3 (three) times daily.   triamcinolone cream (KENALOG) 0.1 % Apply 1 application topically 2 (two) times daily.   [DISCONTINUED] sildenafil (VIAGRA) 25 MG tablet Take 1  tablet (25 mg total) 1/2 hour to 1 hour prior to intercourse as needed. Limit use to 1/2 tablet or 1 tablet per 24 hours.   [DISCONTINUED] triamcinolone cream (KENALOG) 0.1 % Apply 1 application topically 2 (two) times daily.   amoxicillin-clavulanate (AUGMENTIN) 875-125 MG tablet Take 1 tablet by mouth 2 (two) times daily. (Patient not taking: Reported on 10/18/2021)   [DISCONTINUED] metFORMIN (GLUCOPHAGE) 500 MG tablet Take 1  tablet (500 mg total) by mouth 2 (two) times daily with a meal. (Patient not taking: Reported on 04/19/2019)   [DISCONTINUED] sildenafil (VIAGRA) 25 MG tablet Take 1 tablet (25 mg total) 1/2 hour to 1 hour prior to intercourse as needed. Limit use to 1/2 tablet or 1 tablet per 24 hours.   [EXPIRED] triamcinolone acetonide (KENALOG-40) injection 40 mg    No facility-administered encounter medications on file as of 10/18/2021.    Follow-up: No follow-ups on file.   Becky Sax, MD

## 2021-10-25 ENCOUNTER — Other Ambulatory Visit: Payer: Self-pay

## 2021-11-02 IMAGING — DX DG LUMBAR SPINE COMPLETE 4+V
5 series · 5 of 5 positions shown · non-contrast
Comparison: 04/27/2020

CLINICAL DATA: Low back pain, intense with exertion, began 1 prior

EXAM:
LUMBAR SPINE - COMPLETE 4+ VIEW

[l-spine ap]
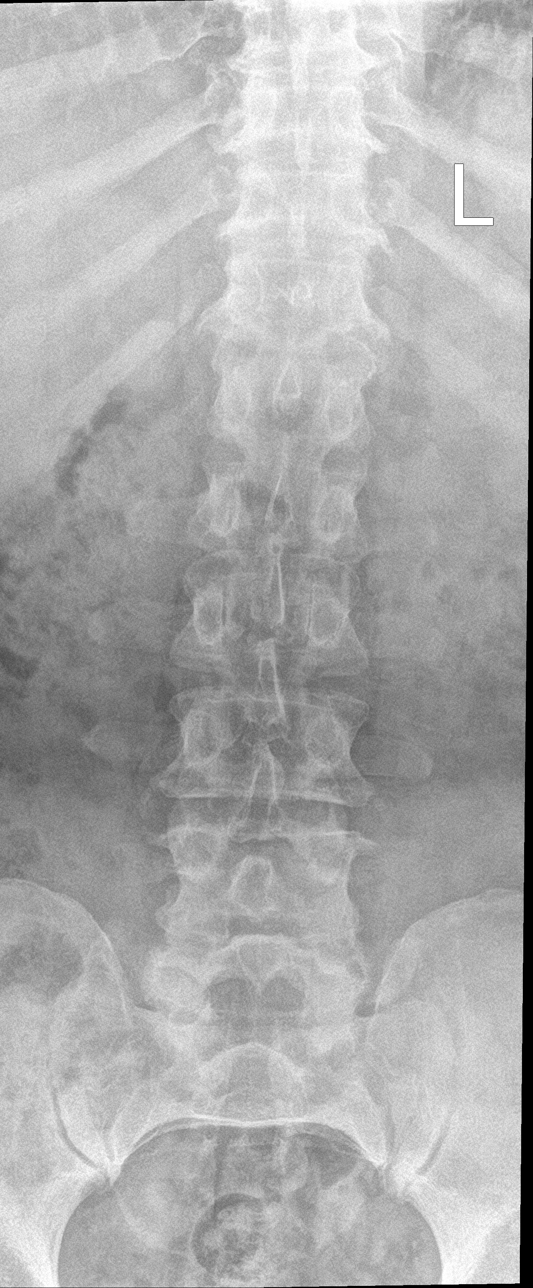

[l-spine obl (1 of 2)]
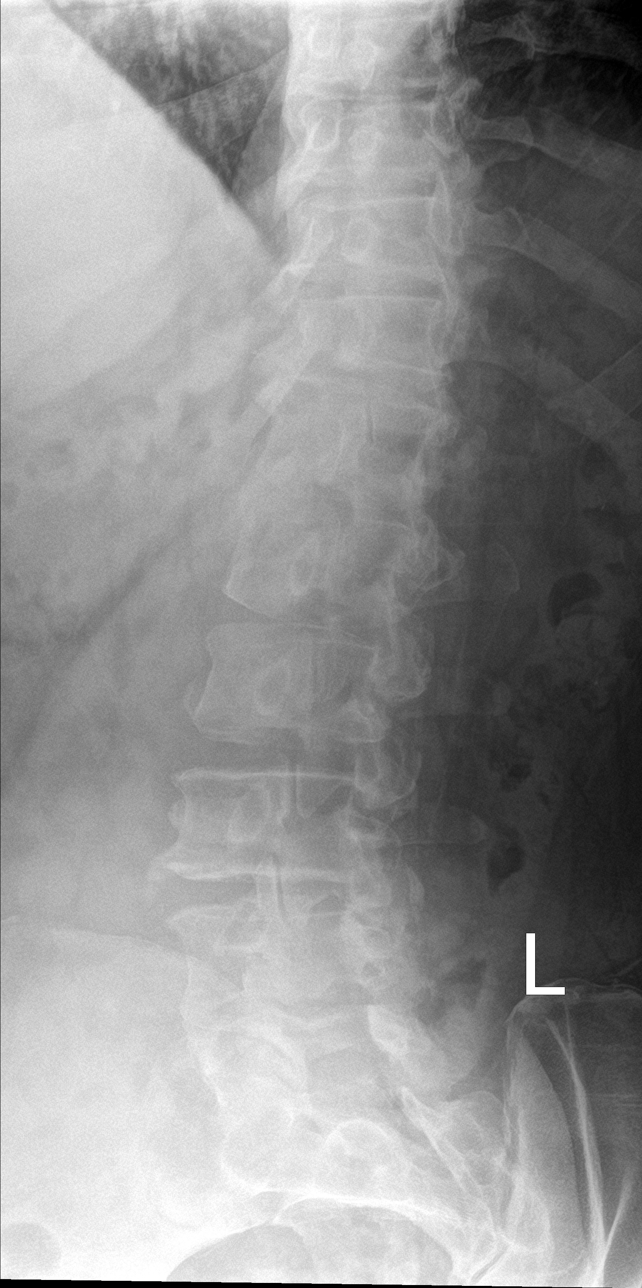

[l-spine obl (2 of 2)]
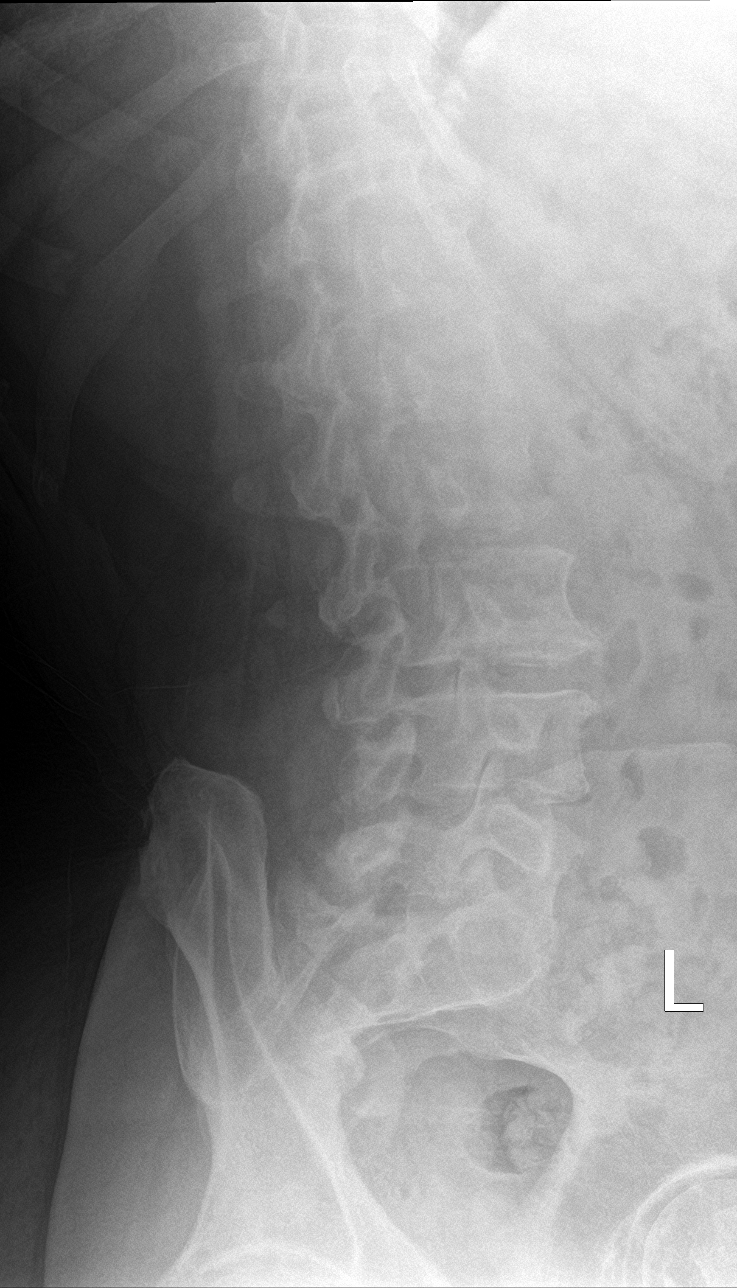

[l-spine lat]
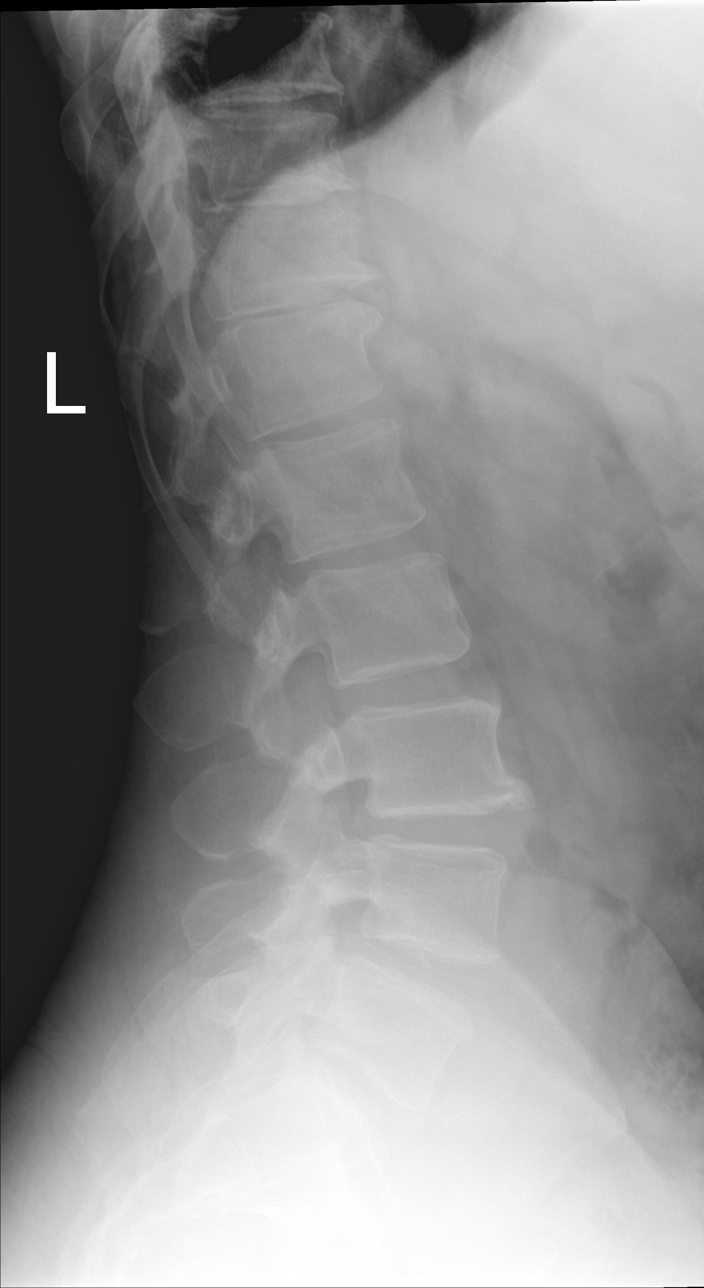

[l-spine spot]
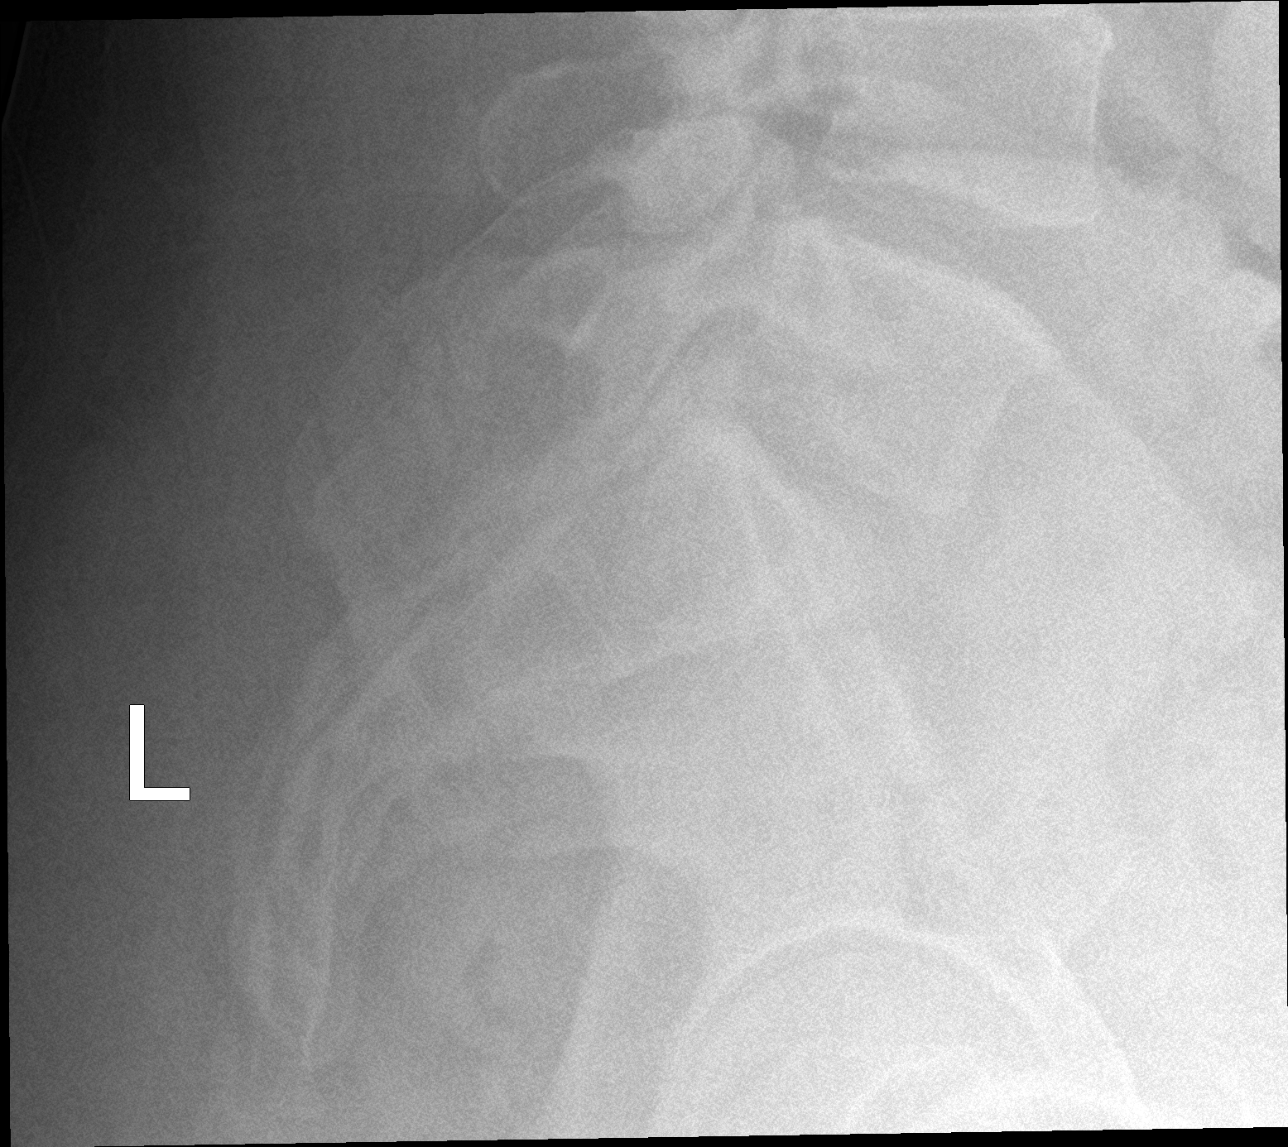

[5 of 5 positions shown; findings below may reference images not displayed]

FINDINGS: Six non-rib-bearing lumbar levels. No acute vertebral body fracture
or height loss. No suspicious osseous lesions. No significant
spondylolisthesis or discernible spondylolysis. Mild multilevel
intervertebral disc height loss with discogenic endplate changes
which are similar to comparison exam, most pronounced L4-5, L6-S1
and lower thoracic levels as included. Facet degenerative changes
most significant towards the lower lumbar levels as well. Included
bones of the pelvis are intact and congruent. No suspicious lytic or
blastic lesions. Soft tissues are free of acute abnormality.
IMPRESSION: No acute osseous abnormality.

Multilevel degenerative changes, as above.

## 2021-11-05 ENCOUNTER — Other Ambulatory Visit: Payer: Self-pay

## 2021-11-06 ENCOUNTER — Other Ambulatory Visit: Payer: Self-pay

## 2021-11-08 ENCOUNTER — Other Ambulatory Visit (HOSPITAL_BASED_OUTPATIENT_CLINIC_OR_DEPARTMENT_OTHER): Payer: Self-pay

## 2021-11-08 MED ORDER — SILDENAFIL CITRATE 100 MG PO TABS
ORAL_TABLET | ORAL | 0 refills | Status: DC
  Filled 2022-10-14: qty 10, 30d supply, fill #0

## 2021-11-14 ENCOUNTER — Other Ambulatory Visit: Payer: Self-pay

## 2021-11-30 ENCOUNTER — Other Ambulatory Visit: Payer: Self-pay | Admitting: *Deleted

## 2021-11-30 ENCOUNTER — Telehealth: Payer: Self-pay | Admitting: Family Medicine

## 2021-11-30 ENCOUNTER — Other Ambulatory Visit: Payer: Self-pay

## 2021-11-30 DIAGNOSIS — I10 Essential (primary) hypertension: Secondary | ICD-10-CM

## 2021-11-30 MED ORDER — AMLODIPINE BESYLATE 10 MG PO TABS
10.0000 mg | ORAL_TABLET | Freq: Every day | ORAL | 0 refills | Status: DC
  Filled 2021-11-30: qty 30, 30d supply, fill #0

## 2021-11-30 NOTE — Telephone Encounter (Signed)
Pt called requesting refills on his Flexeril and Norvasc and send to [facility]. ?

## 2021-12-03 ENCOUNTER — Other Ambulatory Visit: Payer: Self-pay | Admitting: Family Medicine

## 2021-12-03 ENCOUNTER — Other Ambulatory Visit: Payer: Self-pay

## 2021-12-03 DIAGNOSIS — I10 Essential (primary) hypertension: Secondary | ICD-10-CM

## 2021-12-03 MED ORDER — AMLODIPINE BESYLATE 10 MG PO TABS
10.0000 mg | ORAL_TABLET | Freq: Every day | ORAL | 0 refills | Status: DC
  Filled 2021-12-03 – 2021-12-11 (×2): qty 90, 90d supply, fill #0

## 2021-12-03 MED ORDER — CYCLOBENZAPRINE HCL 10 MG PO TABS
10.0000 mg | ORAL_TABLET | Freq: Three times a day (TID) | ORAL | 2 refills | Status: DC | PRN
  Filled 2021-12-03 – 2021-12-11 (×2): qty 60, 20d supply, fill #0
  Filled 2022-06-10 (×2): qty 60, 20d supply, fill #1

## 2021-12-07 ENCOUNTER — Other Ambulatory Visit: Payer: Self-pay

## 2021-12-10 ENCOUNTER — Other Ambulatory Visit: Payer: Self-pay

## 2021-12-10 ENCOUNTER — Telehealth: Payer: Self-pay | Admitting: Family Medicine

## 2021-12-10 NOTE — Telephone Encounter (Signed)
Rx #: [id number]  ?amLODipine (NORVASC) 10 MG tablet [[id number]]  ? ?Rx #: [id number]  ?amoxicillin-clavulanate (AUGMENTIN) 875-125 MG tablet [[id number]]  ? ?Rx #: [id number]  ?cyclobenzaprine (FLEXERIL) 10 MG tablet [[id number]]  ? ?Rx #: [id number]  ?QUEtiapine (SEROQUEL) 100 MG tablet [[id number]]  ? ?Rx #: [id number]  ?sildenafil (VIAGRA) 100 MG tablet [[id number]] ? ? ?Pharmacy ? ?[facility] [address], [place], [place] [address]  ?Phone:  [phone]  Fax:  [phone]  ?DEA #:  [id number]  ? ?

## 2021-12-11 ENCOUNTER — Other Ambulatory Visit: Payer: Self-pay

## 2021-12-11 ENCOUNTER — Other Ambulatory Visit: Payer: Self-pay | Admitting: Family Medicine

## 2021-12-12 ENCOUNTER — Other Ambulatory Visit: Payer: Self-pay

## 2021-12-12 ENCOUNTER — Encounter: Payer: Self-pay | Admitting: Family Medicine

## 2021-12-19 ENCOUNTER — Other Ambulatory Visit: Payer: Self-pay

## 2022-05-03 ENCOUNTER — Other Ambulatory Visit: Payer: Self-pay | Admitting: Family Medicine

## 2022-05-03 ENCOUNTER — Other Ambulatory Visit: Payer: Self-pay

## 2022-05-03 DIAGNOSIS — I10 Essential (primary) hypertension: Secondary | ICD-10-CM

## 2022-05-03 NOTE — Telephone Encounter (Signed)
Requested medication (s) are due for refill today: yes  Requested medication (s) are on the active medication list: yes  Last refill:  12/03/21 #90/0  Future visit scheduled: no  Notes to clinic:  pt had OV in 10/2021. Please advise if ok for refill.      Requested Prescriptions  Pending Prescriptions Disp Refills   amLODipine (NORVASC) 10 MG tablet 90 tablet 0    Sig: Take 1 tablet (10 mg total) by mouth daily.     Cardiovascular: Calcium Channel Blockers 2 Failed - 05/03/2022  4:29 PM      Failed - Last BP in normal range    BP Readings from Last 1 Encounters:  10/18/21 (!) 147/97         Failed - Valid encounter within last 6 months    Recent Outpatient Visits           6 months ago Midline low back pain, unspecified chronicity, unspecified whether sciatica present   Primary Care at [facility], MD   7 months ago Acute non-recurrent frontal sinusitis   Primary Care at [facility], [place], NP   1 year ago Chronic bilateral low back pain without sciatica   Primary Care at [facility], Amy J, NP   1 year ago Essential hypertension   Primary Care at [facility], [place] Be[place], MD   2 years ago Essential hypertension   Primary Care at [facility], Bayard Be[place], MD              Passed - Last Heart Rate in normal range    Pulse Readings from Last 1 Encounters:  10/18/21 90

## 2022-05-03 NOTE — Telephone Encounter (Signed)
Medication Refill - Medication: amLODipine (NORVASC) 10 MG tablet  Has the patient contacted their pharmacy? No. No,more refills.   (Agent: If no, request that the patient contact the pharmacy for the refill. If patient does not wish to contact the pharmacy document the reason why and proceed with request.)   Preferred Pharmacy (with phone number or street name):  [facility]. [address] [place] [address]  Phone: [phone] Fax: [phone]  Hours: M-F 7:30a-6:00p   Has the patient been seen for an appointment in the last year OR does the patient have an upcoming appointment? Yes.    Agent: Please be advised that RX refills may take up to 3 business days. We ask that you follow-up with your pharmacy.

## 2022-05-03 NOTE — Telephone Encounter (Signed)
Requested medications are due for refill today.  unsure  Requested medications are on the active medications list.  yes  Last refill. 12/03/2021 #90 0 refills  Future visit scheduled.   no  Notes to clinic.  Rx written to expire 03/11/2022 Rx is expired. PT needs an appointment.    Requested Prescriptions  Pending Prescriptions Disp Refills   amLODipine (NORVASC) 10 MG tablet 90 tablet 0    Sig: Take 1 tablet (10 mg total) by mouth daily.     Cardiovascular: Calcium Channel Blockers 2 Failed - 05/03/2022  4:01 PM      Failed - Last BP in normal range    BP Readings from Last 1 Encounters:  10/18/21 (!) 147/97         Failed - Valid encounter within last 6 months    Recent Outpatient Visits           6 months ago Midline low back pain, unspecified chronicity, unspecified whether sciatica present   Primary Care at [facility], MD   7 months ago Acute non-recurrent frontal sinusitis   Primary Care at [facility] [place], NP   1 year ago Chronic bilateral low back pain without sciatica   Primary Care at [facility], Amy J, NP   1 year ago Essential hypertension   Primary Care at [facility], Bayard Beaver, MD   2 years ago Essential hypertension   Primary Care at [facility], Bayard Beaver, MD              Passed - Last Heart Rate in normal range    Pulse Readings from Last 1 Encounters:  10/18/21 90

## 2022-05-08 ENCOUNTER — Other Ambulatory Visit: Payer: Self-pay

## 2022-05-08 MED ORDER — AMLODIPINE BESYLATE 10 MG PO TABS
10.0000 mg | ORAL_TABLET | Freq: Every day | ORAL | 0 refills | Status: DC
  Filled 2022-05-08 – 2022-06-10 (×3): qty 90, 90d supply, fill #0

## 2022-05-09 ENCOUNTER — Other Ambulatory Visit: Payer: Self-pay

## 2022-05-15 ENCOUNTER — Other Ambulatory Visit: Payer: Self-pay

## 2022-05-28 ENCOUNTER — Other Ambulatory Visit: Payer: Self-pay

## 2022-06-03 ENCOUNTER — Other Ambulatory Visit: Payer: Self-pay

## 2022-06-04 ENCOUNTER — Other Ambulatory Visit: Payer: Self-pay

## 2022-06-10 ENCOUNTER — Other Ambulatory Visit: Payer: Self-pay

## 2022-06-10 ENCOUNTER — Other Ambulatory Visit (HOSPITAL_COMMUNITY): Payer: Self-pay | Admitting: Psychiatry

## 2022-08-08 ENCOUNTER — Other Ambulatory Visit (HOSPITAL_BASED_OUTPATIENT_CLINIC_OR_DEPARTMENT_OTHER): Payer: Self-pay

## 2022-08-08 MED ORDER — SILDENAFIL CITRATE 100 MG PO TABS
ORAL_TABLET | ORAL | 6 refills | Status: DC
  Filled 2022-08-08: qty 10, 10d supply, fill #0
  Filled 2022-08-12: qty 10, 30d supply, fill #0
  Filled 2022-09-05: qty 10, 10d supply, fill #0
  Filled 2023-01-01: qty 10, 30d supply, fill #1

## 2022-08-12 ENCOUNTER — Ambulatory Visit: Payer: Self-pay

## 2022-08-12 ENCOUNTER — Other Ambulatory Visit: Payer: Self-pay

## 2022-08-12 ENCOUNTER — Other Ambulatory Visit (HOSPITAL_BASED_OUTPATIENT_CLINIC_OR_DEPARTMENT_OTHER): Payer: Self-pay

## 2022-08-12 NOTE — Telephone Encounter (Signed)
  Chief Complaint: mouth lesion  Symptoms: tongue lesion from cut on tongue from broken bottle  Frequency: couple of months ago  Pertinent Negatives: NA Disposition: '[]'$ ED /'[]'$ Urgent Care (no appt availability in office) / '[x]'$ Appointment(In office/virtual)/ '[]'$  [facility]/ '[]'$ Home Care/ '[]'$ Refused Recommended Disposition /'[]'$ [facility]/ '[]'$  Follow-up with PCP Additional Notes: pt states he cut his tongue on broke bottle when he didn't realize the tip was broke. Has small cut no bigger than blackhead on tongue but is painful at times. Pt states he is needing new referral for dentist and eye as well. Has some dental issues with previous dentist. Advised him to contact Medicaid and see who is in network for him. Pt verbalized understanding. Scheduled appt for 08/21/22 @ 1420.   Reason for Disposition  Mouth ulcer lasts > 2 weeks  Answer Assessment - Initial Assessment Questions 1. LOCATION: "Where is the ulcer located?"      Tongue  2. NUMBER: "How many ulcers are there?"      1 3. SIZE: "How large is the ulcer?"      Small area similar to blackhead  4. SEVERITY: "Are they painful?" If Yes, ask: "How bad is it?"  (Scale 1-10; or mild, moderate, severe)  - MILD - eating  and drinking normally   - MODERATE - decreased liquid intake   - SEVERE - drinking very little      Mild to moderate  5. ONSET: "When did you first notice the ulcer?"      Couple of months ago  7. CAUSE: "What do you think is causing the mouth ulcer?"     Was giving and didn't realize bottle was broken  8. OTHER SYMPTOMS: "Do you have any other symptoms?" (e.g., fever)  Protocols used: Mouth Ulcers-A-AH

## 2022-08-12 NOTE — Telephone Encounter (Signed)
Patient called, left VM to return the call to the office to discuss symptoms with a nurse.  Summary: discuss eye glasses/ bump on tounge   Pt states he needs eye glasses, inquired if he has an eye doctor, he states he does not  Pt also states he has a bump on his tongue that will not heal  Please fu w/ pt

## 2022-08-12 NOTE — Telephone Encounter (Signed)
Second attempt to return his call.  Left voicemail to call back.

## 2022-08-16 ENCOUNTER — Other Ambulatory Visit: Payer: Self-pay

## 2022-08-19 NOTE — Progress Notes (Unsigned)
Patient ID: Randy Hansen, male    DOB: 1961/05/19  MRN: [id number]  CC: No chief complaint on file.   Subjective: Jyquez Venegas is a 61 y.o. male who presents for  His concerns today include:   tongue sore and eye glasses  08/12/2022 per triage RN note: Chief Complaint: mouth lesion  Symptoms: tongue lesion from cut on tongue from broken bottle  Frequency: couple of months ago  Pertinent Negatives: NA Disposition: [] ED /[] Urgent Care (no appt availability in office) / [x] Appointment(In office/virtual)/ []  [facility]/ [] Home Care/ [] Refused Recommended Disposition /[] [employer] [facility]/ []  Follow-up with PCP Additional Notes: pt states he cut his tongue on broke bottle when he didn't realize the tip was broke. Has small cut no bigger than blackhead on tongue but is painful at times. Pt states he is needing new referral for dentist and eye as well. Has some dental issues with previous dentist. Advised him to contact Medicaid and see who is in network for him. Pt verbalized understanding. Scheduled appt for 08/21/22 @ 1420.    Reason for Disposition  Mouth ulcer lasts > 2 weeks  Answer Assessment - Initial Assessment Questions 1. LOCATION: "Where is the ulcer located?"      Tongue  2. NUMBER: "How many ulcers are there?"      1 3. SIZE: "How large is the ulcer?"      Small area similar to blackhead  4. SEVERITY: "Are they painful?" If Yes, ask: "How bad is it?"  (Scale 1-10; or mild, moderate, severe)  - MILD - eating  and drinking normally   - MODERATE - decreased liquid intake   - SEVERE - drinking very little      Mild to moderate  5. ONSET: "When did you first notice the ulcer?"      Couple of months ago  7. CAUSE: "What do you think is causing the mouth ulcer?"     Was giving and didn't realize bottle was broken  8. OTHER SYMPTOMS: "Do you have any other symptoms?" (e.g., fever)  Protocols used: Mouth Ulcers-A-AH  Today's visit  08/21/2022:   Patient Active Problem List   Diagnosis Date Noted   Essential hypertension 10/26/2020   Substance induced mood disorder (HCC) 05/25/2020   PTSD (post-traumatic stress disorder) 05/25/2020   Malignant neoplasm of prostate (HCC) 12/14/2019   Mass of soft tissue of hip 07/29/2019   Renal mass 11/16/2018     Current Outpatient Medications on File Prior to Visit  Medication Sig Dispense Refill   amLODipine (NORVASC) 10 MG tablet Take 1 tablet (10 mg total) by mouth daily. 90 tablet 0   amoxicillin-clavulanate (AUGMENTIN) 875-125 MG tablet Take 1 tablet by mouth 2 (two) times daily. (Patient not taking: Reported on 10/18/2021) 20 tablet 0   cyclobenzaprine (FLEXERIL) 10 MG tablet Take 1 tablet (10 mg total) by mouth 3 (three) times daily as needed for muscle spasms. 60 tablet 2   fluticasone (FLONASE) 50 MCG/ACT nasal spray PLACE 2 SPRAYS INTO BOTH NOSTRILS DAILY. 16 g 6   fluticasone (FLONASE) 50 MCG/ACT nasal spray Place into the nose.     prazosin (MINIPRESS) 1 MG capsule TAKE 1 CAPSULE (1 MG TOTAL) BY MOUTH AT BEDTIME. 30 capsule 2   QUEtiapine (SEROQUEL) 100 MG tablet TAKE 1 TABLET (100 MG TOTAL) BY MOUTH AT BEDTIME. 30 tablet 2   Salicylic Acid 17 % KIT Apply topically.     salicylic acid-lactic acid 17 % external solution Apply  topically daily. 14 mL 0   sildenafil (VIAGRA) 100 MG tablet Take 1 tablet (100 mg total) by mouth daily as needed for Erectile Dysfunction. 10 tablet 0   sildenafil (VIAGRA) 100 MG tablet Take 1 tablet (100 mg total) by mouth daily as needed for Erectile Dysfunction. 10 tablet 6   tadalafil (CIALIS) 20 MG tablet TAKE 1 TABLET BY MOUTH 30 MINUTES TO 1 HOUR PRIOR TO INTERCOURSE AS NEEDED. LIMIT TO 1/2 TABLET OR 1 TABLET PER 24 HOURS 30 tablet 0   tiZANidine (ZANAFLEX) 4 MG tablet Take 1 tablet (4 mg total) by mouth 3 (three) times daily. 30 tablet 1   triamcinolone cream (KENALOG) 0.1 % Apply 1 application topically 2 (two) times daily. 80 g 1    [DISCONTINUED] metFORMIN (GLUCOPHAGE) 500 MG tablet Take 1 tablet (500 mg total) by mouth 2 (two) times daily with a meal. (Patient not taking: Reported on 04/19/2019) 180 tablet 3   No current facility-administered medications on file prior to visit.    No Known Allergies  Social History   Socioeconomic History   Marital status: Single    Spouse name: Not on file   Number of children: 1   Years of education: Not on file   Highest education level: Not on file  Occupational History   Not on file  Tobacco Use   Smoking status: Some Days    Packs/day: 0.50    Years: 40.00    Total pack years: 20.00    Types: Cigarettes   Smokeless tobacco: Never   Tobacco comments:    02-03-2020 per pt 1/2 pp7d  Vaping Use   Vaping Use: Never used  Substance and Sexual Activity   Alcohol use: Yes    Alcohol/week: 7.0 - 14.0 standard drinks of alcohol    Types: 7 - 14 Cans of beer per week    Comment: 1-2 beer daily   Drug use: Never   Sexual activity: Yes  Other Topics Concern   Not on file  Social History Narrative   Not on file   Social Determinants of Health   Financial Resource Strain: Not on file  Food Insecurity: Not on file  Transportation Needs: Not on file  Physical Activity: Not on file  Stress: Not on file  Social Connections: Not on file  Intimate Partner Violence: Not on file    Family History  Problem Relation Age of Onset   Hypertension Mother    Breast cancer Mother    Hypertension Father    Hypertension Maternal Grandmother    Prostate cancer Neg Hx    Colon cancer Neg Hx    Pancreatic cancer Neg Hx     Past Surgical History:  Procedure Laterality Date   GOLD SEED IMPLANT N/A 02/11/2020   Procedure: GOLD SEED IMPLANT;  Surgeon: Rene Paci, MD;  Location: [facility];  Service: Urology;  Laterality: N/A;   NO PAST SURGERIES     PROSTATE BIOPSY     SPACE OAR INSTILLATION N/A 02/11/2020   Procedure: SPACE OAR INSTILLATION;   Surgeon: Rene Paci, MD;  Location: [facility];  Service: Urology;  Laterality: N/A;   TRANSRECTAL ULTRASOUND N/A 02/11/2020   Procedure: TRANSRECTAL ULTRASOUND;  Surgeon: Rene Paci, MD;  Location[facility];  Service: Urology;  Laterality: N/A;  ONLY NEED 30 MIN FOR ALL PROCEDURES    ROS: Review of Systems Negative except as stated above  PHYSICAL EXAM: There were no vitals taken  for this visit.  Physical Exam  {male adult master:[id number]} {male adult master:310785}     Latest Ref Rng & Units 09/12/2021    5:27 PM 04/03/2021    3:31 PM 06/07/2019    4:28 PM  CMP  Glucose 70 - 99 mg/dL 89  94  098   BUN 6 - 24 mg/dL 16  16  9    Creatinine 0.76 - 1.27 mg/dL 1.19  1.47  8.29   Sodium 134 - 144 mmol/L 140  141  142   Potassium 3.5 - 5.2 mmol/L 4.1  4.0  4.2   Chloride 96 - 106 mmol/L 100  103  103   CO2 20 - 29 mmol/L 24  24  26    Calcium 8.7 - 10.2 mg/dL 9.8  9.6  56.2   Total Protein 6.0 - 8.5 g/dL   7.2   Total Bilirubin 0.0 - 1.2 mg/dL   <1.3   Alkaline Phos 39 - 117 IU/L   92   AST 0 - 40 IU/L   19   ALT 0 - 44 IU/L   30    Lipid Panel     Component Value Date/Time   CHOL 189 04/19/2019 1109   TRIG 309 (H) 04/19/2019 1109   HDL 46 04/19/2019 1109   CHOLHDL 4.1 04/19/2019 1109   LDLCALC 81 04/19/2019 1109    CBC    Component Value Date/Time   WBC 5.3 09/12/2021 1727   WBC 9.9 10/01/2017 2342   RBC 4.80 09/12/2021 1727   RBC 5.15 10/01/2017 2342   HGB 15.0 09/12/2021 1727   HCT 43.8 09/12/2021 1727   PLT 284 09/12/2021 1727   MCV 91 09/12/2021 1727   MCH 31.3 09/12/2021 1727   MCH 31.1 10/01/2017 2342   MCHC 34.2 09/12/2021 1727   MCHC 34.6 10/01/2017 2342   RDW 12.9 09/12/2021 1727   LYMPHSABS 1.8 02/22/2019 1026   MONOABS 0.7 07/22/2007 2340   EOSABS 0.2 02/22/2019 1026   BASOSABS 0.0 02/22/2019 1026    ASSESSMENT AND PLAN:  There are no diagnoses linked to this  encounter.   Patient was given the opportunity to ask questions.  Patient verbalized understanding of the plan and was able to repeat key elements of the plan. Patient was given clear instructions to go to Emergency Department or return to medical center if symptoms don't improve, worsen, or new problems develop.The patient verbalized understanding.   No orders of the defined types were placed in this encounter.    Requested Prescriptions    No prescriptions requested or ordered in this encounter    No follow-ups on file.  Rema Fendt, NP

## 2022-08-21 ENCOUNTER — Encounter: Payer: Medicaid Other | Admitting: Family

## 2022-09-05 ENCOUNTER — Other Ambulatory Visit: Payer: Self-pay

## 2022-09-05 ENCOUNTER — Ambulatory Visit
Admission: EM | Admit: 2022-09-05 | Discharge: 2022-09-05 | Disposition: A | Discharge status: Home / Self Care | Payer: Medicaid Other | Attending: Physician Assistant | Admitting: Physician Assistant

## 2022-09-05 ENCOUNTER — Encounter: Payer: Self-pay | Admitting: Emergency Medicine

## 2022-09-05 DIAGNOSIS — S29011A Strain of muscle and tendon of front wall of thorax, initial encounter: Secondary | ICD-10-CM

## 2022-09-05 DIAGNOSIS — K148 Other diseases of tongue: Secondary | ICD-10-CM

## 2022-09-05 MED ORDER — CYCLOBENZAPRINE HCL 10 MG PO TABS
10.0000 mg | ORAL_TABLET | Freq: Two times a day (BID) | ORAL | 0 refills | Status: AC | PRN
  Filled 2022-09-05: qty 20, 10d supply, fill #0

## 2022-09-05 MED ORDER — PREDNISONE 20 MG PO TABS
40.0000 mg | ORAL_TABLET | Freq: Every day | ORAL | 0 refills | Status: AC
  Filled 2022-09-05: qty 10, 5d supply, fill #0

## 2022-09-05 NOTE — ED Provider Notes (Signed)
[facility]    CSN: [id number] Arrival date & time: 09/05/22  1323      History   Chief Complaint Chief Complaint  Patient presents with   Chest Pain    HPI Randy Hansen is a 61 y.o. male.   Patient here today for evaluation of right-sided chest pain that is worse with palpation and movement.  He states that symptoms started after he had been doing some heavy lifting.  He also reports some pain in his left shoulder but this is chronic.  He denies any pain with taking deep breaths.  He is not short of breath.  He has not had fever.  He reports lesion on his tongue that is been present for 3 months after he cut it on glass.  He states there has not been any improvement or worsening but does state that when he eats certain foods the lesion will "open up".  The history is provided by the patient.  Chest Pain Associated symptoms: no fever, no numbness and no shortness of breath     Past Medical History:  Diagnosis Date   Adrenal adenoma, right 2018   Cancer (HCC)    Phreesia 04/20/2020   Essential hypertension    dx by pcp 02-22-2019 per note in epic was given medication    (02-03-2020  asked pt about medication for bp and stated he stopped taking long time age , he does not have any problems now,  his bp wsa high "only because I was incarcerated I could not exercise and gained weight")   Hyperlipidemia    (02-03-2020  pt denies )   Hyperplasia of prostate with lower urinary tract symptoms (LUTS)    Lipoma    left hip   Pre-diabetes    dx 02-22-2019 by pcp note in epic was given metformin;  per next note 07/ 2020 pt had stopped taking metformin   (02-03-2020  pt denies having pre-diabetes and denied ever taking medication)   Prostate cancer (HCC) urologist--- dr winter/  oncologsit--- dr manning   dx 0204-2021  Stage T1c,  Gleason 3+4    Patient Active Problem List   Diagnosis Date Noted   Essential hypertension 10/26/2020   Substance induced mood  disorder (HCC) 05/25/2020   PTSD (post-traumatic stress disorder) 05/25/2020   Malignant neoplasm of prostate (HCC) 12/14/2019   Mass of soft tissue of hip 07/29/2019   Renal mass 11/16/2018    Past Surgical History:  Procedure Laterality Date   GOLD SEED IMPLANT N/A 02/11/2020   Procedure: GOLD SEED IMPLANT;  Surgeon: Winter, Christopher Aaron, MD;  Location: [facility];  Service: Urology;  Laterality: N/A;   NO PAST SURGERIES     PROSTATE BIOPSY     SPACE OAR INSTILLATION N/A 02/11/2020   Procedure: SPACE OAR INSTILLATION;  Surgeon: Winter, Christopher Aaron, MD;  Location: [facility];  Service: Urology;  Laterality: N/A;   TRANSRECTAL ULTRASOUND N/A 02/11/2020   Procedure: TRANSRECTAL ULTRASOUND;  Surgeon: Winter, Christopher Aaron, MD;  Location: [facility];  Service: Urology;  Laterality: N/A;  ONLY NEED 30 MIN FOR ALL PROCEDURES       Home Medications    Prior to Admission medications   Medication Sig Start Date End Date Taking? Authorizing Provider  cyclobenzaprine (FLEXERIL) 10 MG tablet Take 1 tablet (10 mg total) by mouth 2 (two) times daily as needed for muscle spasms. 09/05/22  Yes ,  F, PA-C  predniSONE (DELTASONE) 20 MG   tablet Take 2 tablets (40 mg total) by mouth daily with breakfast for 5 days. 09/05/22 09/10/22 Yes Francene Finders, PA-C  amLODipine (NORVASC) 10 MG tablet Take 1 tablet (10 mg total) by mouth daily. 05/08/22 09/09/22  Dorna Mai, MD  amoxicillin-clavulanate (AUGMENTIN) 875-125 MG tablet Take 1 tablet by mouth 2 (two) times daily. Patient not taking: Reported on 10/18/2021 09/12/21   Camillia Herter, NP  fluticasone (FLONASE) 50 MCG/ACT nasal spray PLACE 2 SPRAYS INTO BOTH NOSTRILS DAILY. 10/26/20 10/26/21  Nicolette Bang, MD  fluticasone (FLONASE) 50 MCG/ACT nasal spray Place into the nose. 10/26/20 10/26/21  [provider]  prazosin (MINIPRESS) 1 MG capsule TAKE 1 CAPSULE (1 MG  TOTAL) BY MOUTH AT BEDTIME. 11/22/20 11/22/21  Eulis Canner E, NP  QUEtiapine (SEROQUEL) 100 MG tablet TAKE 1 TABLET (100 MG TOTAL) BY MOUTH AT BEDTIME. 04/04/21 04/04/22  Eulis Canner E, NP  Salicylic Acid 17 % KIT Apply topically. 02/14/21   [provider]  salicylic acid-lactic acid 17 % external solution Apply topically daily. 02/14/21   Raspet, Derry Skill, PA-C  sildenafil (VIAGRA) 100 MG tablet Take 1 tablet (100 mg total) by mouth daily as needed for Erectile Dysfunction. 11/08/21     sildenafil (VIAGRA) 100 MG tablet Take 1 tablet (100 mg total) by mouth daily as needed for Erectile Dysfunction. 08/08/22     tadalafil (CIALIS) 20 MG tablet TAKE 1 TABLET BY MOUTH 30 MINUTES TO 1 HOUR PRIOR TO INTERCOURSE AS NEEDED. LIMIT TO 1/2 TABLET OR 1 TABLET PER 24 HOURS 10/19/21   Dorna Mai, MD  tiZANidine (ZANAFLEX) 4 MG tablet Take 1 tablet (4 mg total) by mouth 3 (three) times daily. 04/03/21   Camillia Herter, NP  triamcinolone cream (KENALOG) 0.1 % Apply 1 application topically 2 (two) times daily. 10/18/21   Dorna Mai, MD  metFORMIN (GLUCOPHAGE) 500 MG tablet Take 1 tablet (500 mg total) by mouth 2 (two) times daily with a meal. Patient not taking: Reported on 04/19/2019 02/25/19 06/25/19  Scot Jun, FNP    Family History Family History  Problem Relation Age of Onset   Hypertension Mother    Breast cancer Mother    Hypertension Father    Hypertension Maternal Grandmother    Prostate cancer Neg Hx    Colon cancer Neg Hx    Pancreatic cancer Neg Hx     Social History Social History   Tobacco Use   Smoking status: Some Days    Packs/day: 0.50    Years: 40.00    Total pack years: 20.00    Types: Cigarettes   Smokeless tobacco: Never   Tobacco comments:    02-03-2020 per pt 1/2 pp7d  Vaping Use   Vaping Use: Never used  Substance Use Topics   Alcohol use: Yes    Alcohol/week: 7.0 - 14.0 standard drinks of alcohol    Types: 7 - 14 Cans of beer per week     Comment: 1-2 beer daily   Drug use: Never     Allergies   Patient has no known allergies.   Review of Systems Review of Systems  Constitutional:  Negative for chills and fever.  HENT:  Negative for mouth sores.   Eyes:  Negative for discharge and redness.  Respiratory:  Negative for shortness of breath and wheezing.   Cardiovascular:  Positive for chest pain.  Musculoskeletal:  Positive for myalgias.  Skin:  Positive for color change and wound.  Neurological:  Negative for  numbness.     Physical Exam Triage Vital Signs ED Triage Vitals [09/05/22 1512]  Enc Vitals Group     BP (!) 143/84     Pulse Rate 95     Resp 18     Temp 98 F (36.7 C)     Temp Source Oral     SpO2 96 %     Weight      Height      Head Circumference      Peak Flow      Pain Score 4     Pain Loc      Pain Edu?      Excl. in GC?    No data found.  Updated Vital Signs BP (!) 143/84 (BP Location: Left Arm)   Pulse 95   Temp 98 F (36.7 C) (Oral)   Resp 18   SpO2 96%   Physical Exam Vitals and nursing note reviewed.  Constitutional:      General: He is not in acute distress.    Appearance: Normal appearance. He is not ill-appearing.  HENT:     Head: Normocephalic and atraumatic.     Nose: Nose normal. No congestion or rhinorrhea.     Mouth/Throat:     Mouth: Mucous membranes are moist.     Comments: Single 1 cm raised pink lesion to mid tongue- no active bleeding or drainage Eyes:     Conjunctiva/sclera: Conjunctivae normal.  Cardiovascular:     Rate and Rhythm: Normal rate and regular rhythm.  Pulmonary:     Effort: Pulmonary effort is normal. No respiratory distress.     Breath sounds: Normal breath sounds. No wheezing, rhonchi or rales.  Chest:     Chest wall: Tenderness (TTP to right lateral pectoral area, no erythema or swelling present) present.  Neurological:     Mental Status: He is alert.  Psychiatric:        Mood and Affect: Mood normal.        Behavior: Behavior  normal.        Thought Content: Thought content normal.      UC Treatments / Results  Labs (all labs ordered are listed, but only abnormal results are displayed) Labs Reviewed - No data to display  EKG   Radiology No results found.  Procedures Procedures (including critical care time)  Medications Ordered in UC Medications - No data to display  Initial Impression / Assessment and Plan / UC Course  I have reviewed the triage vital signs and the nursing notes.  Pertinent labs & imaging results that were available during my care of the patient were reviewed by me and considered in my medical decision making (see chart for details).    Steroid burst and muscle relaxer prescribed for suspected pectoralis strain.  Recommended follow-up if no gradual improvement with any further concerns  Unknown etiology of tongue lesion but patient reports that it appeared after he accidentally cut his tongue on some glass.  Discussed that could be scar tissue however given persistence and lack of improvement recommended evaluation by primary care with possible referral to ENT for biopsy.  Patient expresses understanding.  Final diagnoses:  Strain of right pectoralis muscle, initial encounter  Tongue lesion   Discharge Instructions   None    ED Prescriptions     Medication Sig Dispense Auth. Provider   predniSONE (DELTASONE) 20 MG tablet Take 2 tablets (40 mg total) by mouth daily with breakfast for 5 days. 10 tablet ,   Sallee Lange, PA-C   cyclobenzaprine (FLEXERIL) 10 MG tablet Take 1 tablet (10 mg total) by mouth 2 (two) times daily as needed for muscle spasms. 20 tablet Francene Finders, PA-C      PDMP not reviewed this encounter.   Francene Finders, PA-C 09/05/22 1606

## 2022-09-05 NOTE — ED Triage Notes (Signed)
Pt sts right sided CP worse with palpation and movement x 4 days since heavy lifting at work; pt sts chronic issue with left shoulder; pt sts bump on his tongue x 3 months since cutting on glass; pt sts some coughing

## 2022-09-06 ENCOUNTER — Other Ambulatory Visit: Payer: Self-pay

## 2022-09-10 ENCOUNTER — Ambulatory Visit: Payer: Medicaid Other | Admitting: Family Medicine

## 2022-09-10 ENCOUNTER — Other Ambulatory Visit: Payer: Self-pay

## 2022-10-14 ENCOUNTER — Other Ambulatory Visit: Payer: Self-pay

## 2022-10-14 ENCOUNTER — Other Ambulatory Visit: Payer: Self-pay | Admitting: Family Medicine

## 2022-10-18 ENCOUNTER — Other Ambulatory Visit: Payer: Self-pay

## 2022-10-21 ENCOUNTER — Other Ambulatory Visit: Payer: Self-pay

## 2022-12-23 ENCOUNTER — Ambulatory Visit: Payer: Self-pay | Admitting: *Deleted

## 2022-12-23 NOTE — Telephone Encounter (Signed)
Patient had called agent- patient requesting medication refill, then mentioned tooth pain and then left arm pain. Call was transferred to triage- but patient had disconnected at answer. Attempted to call patient back on both contact numbers and got no answer- call can not be completed at this time- try later message.

## 2022-12-23 NOTE — Telephone Encounter (Signed)
Pt called in but it didn't connect when the agent transferred the call to me.

## 2022-12-23 NOTE — Telephone Encounter (Signed)
Attempted to call patient- no answer- call can not be completed at this time message

## 2022-12-23 NOTE — Telephone Encounter (Signed)
Patient called on mobile number listed to triage the tooth pain and recording says to call him on [phone] due to having trouble with his phone. Patient called and the person who answered said he was not there at the moment. I asked for her to have him to call the office when he returns, she says she would tell him.  Unable to reach patient after 3 attempts by [facility] NT, routing to the provider for resolution per protocol.

## 2022-12-24 ENCOUNTER — Other Ambulatory Visit: Payer: Medicaid Other | Admitting: *Deleted

## 2022-12-24 ENCOUNTER — Ambulatory Visit: Payer: Self-pay | Admitting: *Deleted

## 2022-12-24 DIAGNOSIS — I10 Essential (primary) hypertension: Secondary | ICD-10-CM

## 2022-12-24 NOTE — Telephone Encounter (Signed)
  Chief Complaint: Dental pain Symptoms: Tooth pain lower right bottom, 2 teeth. Mild swelling of gums. 10/10, taking IBU "Helps some."Called requesting antibiotics States "I have a dentist but he won't see me until I have antibiotics before he can pull them." Also reports left arm "Muscle sore, the big muscle,upper arm." Frequency: 1-2 weeks Pertinent Negatives: Patient denies fever Disposition: [] ED /[] Urgent Care (no appt availability in office) / [x] Appointment(In office/virtual)/ []  [facility] [facility]/ [] Home Care/ [] Refused Recommended Disposition /[]  Mobile Bus/ []  Follow-up with PCP Additional Notes: Advised would need appt for possible antibiotics RX.  Appt secured, first available with Amy, tomorrow AM. Care advise provided, verbalizes understanding.  Also requested refills of Amlodipine and Viagra. Sent to [facility] refill pool. Reason for Disposition  Toothache present > 24 hours  Answer Assessment - Initial Assessment Questions 1. LOCATION: "Which tooth is hurting?"  (e.g., right-side/left-side, upper/lower, front/back)     Lower right in back, 2 teeth, one closer to front 2. ONSET: "When did the toothache start?"  (e.g., hours, days)      1-2 weeks ago 3. SEVERITY: "How bad is the toothache?"  (Scale 1-10; mild, moderate or severe)   - MILD (1-3): doesn't interfere with chewing    - MODERATE (4-7): interferes with chewing, interferes with normal activities, awakens from sleep     - SEVERE (8-10): unable to eat, unable to do any normal activities, excruciating pain        10/10 4. SWELLING: "Is there any visible swelling of your face?"     Gums swollen back right 5. OTHER SYMPTOMS: "Do you have any other symptoms?" (e.g., fever)     Left arm muscle pain, onset 1 week ago, comes and goes. Taking aleve or IBU at times. Helps some  Protocols used: Toothache-A-AH

## 2022-12-24 NOTE — Telephone Encounter (Signed)
Requested medications are due for refill today.  yes  Requested medications are on the active medications list.  yes  Last refill. varied  Future visit scheduled.   Tomorrow.   Notes to clinic.  Labs are expired. PT last seen 1 year ago.    Requested Prescriptions  Pending Prescriptions Disp Refills   amLODipine (NORVASC) 10 MG tablet 90 tablet 0    Sig: Take 1 tablet (10 mg total) by mouth daily.     Cardiovascular: Calcium Channel Blockers 2 Failed - 12/24/2022  2:58 PM      Failed - Last BP in normal range    BP Readings from Last 1 Encounters:  09/05/22 (!) 143/84         Failed - Valid encounter within last 6 months    Recent Outpatient Visits           1 year ago Midline low back pain, unspecified chronicity, unspecified whether sciatica present   [place] [place] [facility], MD   1 year ago Acute non-recurrent frontal sinusitis   [place] [facility], [place], NP   1 year ago Chronic bilateral low back pain without sciatica   [place] [facility], [place], NP   2 years ago Essential hypertension   [place] [facility], Bayard Beaver, MD   2 years ago Essential hypertension   [place] [facility], Bayard Beaver, MD       Future Appointments             Tomorrow Camillia Herter, NP [place] [facility] [place] in normal range    Pulse Readings from Last 1 Encounters:  09/05/22 95          sildenafil (VIAGRA) 100 MG tablet 10 tablet 0    Sig: Take 1 tablet (100 mg total) by mouth daily as needed for Erectile Dysfunction.     Urology: Erectile Dysfunction Agents Failed - 12/24/2022  2:58 PM      Failed - AST in normal range and within 360 days    AST  Date Value Ref Range Status  06/07/2019 19 0 - 40 IU/L Final         Failed - ALT in normal range and  within 360 days    ALT  Date Value Ref Range Status  06/07/2019 30 0 - 44 IU/L Final         Failed - Last BP in normal range    BP Readings from Last 1 Encounters:  09/05/22 (!) 143/84         Failed - Valid encounter within last 12 months    Recent Outpatient Visits           1 year ago Midline low back pain, unspecified chronicity, unspecified whether sciatica present   [place] [facility], MD   1 year ago Acute non-recurrent frontal sinusitis   [place] [facility], [place], NP   1 year ago Chronic bilateral low back pain without sciatica   [place] [facility], [place], NP   2 years ago Essential hypertension   [place] [facility] at [facility], Bayard Beaver, MD   2  years ago Essential hypertension   [place] [facility] at [facility], Bayard Beaver, MD       Future Appointments             Tomorrow Camillia Herter, NP [place] [facility]

## 2022-12-25 ENCOUNTER — Ambulatory Visit: Payer: Medicaid Other | Admitting: Family

## 2022-12-25 NOTE — Progress Notes (Signed)
Erroneous encounter-disregard

## 2022-12-25 NOTE — Telephone Encounter (Signed)
I have attempted to contact this patient by phone, no answer

## 2022-12-27 NOTE — Telephone Encounter (Signed)
Patient's last appointment with me 09/12/2021. Please refill if appropriate. Thank you.

## 2022-12-31 ENCOUNTER — Encounter: Payer: Medicaid Other | Admitting: Family

## 2023-01-01 ENCOUNTER — Other Ambulatory Visit: Payer: Self-pay

## 2023-01-01 ENCOUNTER — Other Ambulatory Visit: Payer: Self-pay | Admitting: Family Medicine

## 2023-01-01 DIAGNOSIS — J0110 Acute frontal sinusitis, unspecified: Secondary | ICD-10-CM

## 2023-01-01 DIAGNOSIS — I10 Essential (primary) hypertension: Secondary | ICD-10-CM

## 2023-01-03 ENCOUNTER — Other Ambulatory Visit: Payer: Self-pay

## 2023-01-06 ENCOUNTER — Ambulatory Visit: Payer: Medicaid Other | Admitting: Family Medicine

## 2023-01-07 ENCOUNTER — Other Ambulatory Visit: Payer: Self-pay

## 2023-02-03 ENCOUNTER — Ambulatory Visit: Payer: Self-pay | Admitting: *Deleted

## 2023-02-03 NOTE — Telephone Encounter (Signed)
Message from Cory Munch sent at 02/03/2023 12:24 PM EDT  Summary: donate plasma   Patient wanted to donate plasma, but bp was high (did not know how high) and could not donate plasma. Would like to know where he can go in [place] to get a paper filled to okay him to donate.          Call History   Type Contact Phone/Fax User  02/03/2023 12:19 PM EDT Phone (Incoming) Nile, Drabek (Self) [phone] Judie Petit) Cory Munch   Reason for Disposition  General information question, no triage required and triager able to answer question  Answer Assessment - Initial Assessment Questions 1. REASON FOR CALL or QUESTION: "What is your reason for calling today?" or "How can I best help you?" or "What question do you have that I can help answer?"     He wanted to know where he can go in [place] to get a paper filled out so he can donate Plasma.   He was not allowed to donate plasma because his BP was high, didn't know value. I let him know he would need to check with the donation center to find out where to go for that paperwork.   He said,   "Ok thank you so much".  Protocols used: Information Only Call - No Triage-A-AH

## 2023-02-03 NOTE — Telephone Encounter (Signed)
  Chief Complaint: See his message.   Needing to know where to go in [place] to get paperwork filled out for plasma donation. Symptoms: Not allowed to donate because his BP was elevated Frequency: N/A Pertinent Negatives: Patient denies N/A Disposition: [] ED /[] Urgent Care (no appt availability in office) / [] Appointment(In office/virtual)/ []  [place] [facility]/ [x] Home Care/ [] Refused Recommended Disposition /[] [place] [facility]/ []  Follow-up with PCP Additional Notes: I let him know he would need to talk with the donation center for that information.   He thanked me for calling back.

## 2023-02-13 ENCOUNTER — Telehealth: Payer: Self-pay | Admitting: Family Medicine

## 2023-02-13 ENCOUNTER — Ambulatory Visit: Payer: Self-pay

## 2023-02-13 ENCOUNTER — Other Ambulatory Visit: Payer: Self-pay | Admitting: Family Medicine

## 2023-02-13 DIAGNOSIS — I10 Essential (primary) hypertension: Secondary | ICD-10-CM

## 2023-02-13 NOTE — Telephone Encounter (Signed)
Requested medication (s) are due for refill today: yes  Requested medication (s) are on the active medication list: yes  Last refill:  amlodipine 05/08/22 #90/0, viagra 11/08/21 #10/0  Future visit scheduled: no  Notes to clinic:  pt is due for OV and updated labs, Viagra prescribed by Urology as well. Called and LVMTCB for pt to discuss meds.      Requested Prescriptions  Pending Prescriptions Disp Refills   amLODipine (NORVASC) 10 MG tablet 90 tablet 0    Sig: Take 1 tablet (10 mg total) by mouth daily.     Cardiovascular: Calcium Channel Blockers 2 Failed - 02/13/2023 10:13 AM      Failed - Last BP in normal range    BP Readings from Last 1 Encounters:  09/05/22 (!) 143/84         Failed - Valid encounter within last 6 months    Recent Outpatient Visits           1 year ago Midline low back pain, unspecified chronicity, unspecified whether sciatica present   [place] [facility], MD   1 year ago Acute non-recurrent frontal sinusitis   [place] [facility], Amy J, NP   1 year ago Chronic bilateral low back pain without sciatica   [place] [facility] at [facility], [place], NP   2 years ago Essential hypertension   [place] [facility], Kandee Keen, MD   2 years ago Essential hypertension   [place] [facility], Kandee Keen, MD              Passed - Last Heart Rate in normal range    Pulse Readings from Last 1 Encounters:  09/05/22 95          sildenafil (VIAGRA) 100 MG tablet 10 tablet 0    Sig: Take 1 tablet (100 mg total) by mouth daily as needed for Erectile Dysfunction.     Urology: Erectile Dysfunction Agents Failed - 02/13/2023 10:13 AM      Failed - AST in normal range and within 360 days    AST  Date Value Ref Range Status  06/07/2019 19 0 - 40 IU/L Final         Failed - ALT in normal range and  within 360 days    ALT  Date Value Ref Range Status  06/07/2019 30 0 - 44 IU/L Final         Failed - Last BP in normal range    BP Readings from Last 1 Encounters:  09/05/22 (!) 143/84         Failed - Valid encounter within last 12 months    Recent Outpatient Visits           1 year ago Midline low back pain, unspecified chronicity, unspecified whether sciatica present   [place] [facility], MD   1 year ago Acute non-recurrent frontal sinusitis   [place] [facility], Amy J, NP   1 year ago Chronic bilateral low back pain without sciatica   [place] [facility], Amy J, NP   2 years ago Essential hypertension    [facility] at [facility], Kandee Keen, MD   2 years ago Essential hypertension    Primary Care at [facility],  Kandee Keen, MD

## 2023-02-13 NOTE — Telephone Encounter (Signed)
Pt called, LVMTCB to discuss medication for refill that isn't on pt's med list.

## 2023-02-13 NOTE — Telephone Encounter (Signed)
Medication Refill - Medication: amLODipine (NORVASC) 10 MG tablet sildenafil (VIAGRA) 100 MG tablet Pt states that he was prescribed medication to help him stop going to the bathroom so much (urinating) due to his prostate cancer. He did not pick the medication up and realize now that he does need the medication and he forgot the name of the medication but is wanting to see if his PCP can send it into the pharmacy.  Has the patient contacted their pharmacy? No. Preferred Pharmacy (with phone number or street name):  [facility]  Phone: [phone] Fax: [phone]  Has the patient been seen for an appointment in the last year OR does the patient have an upcoming appointment? No.  Agent: Please be advised that RX refills may take up to 3 business days. We ask that you follow-up with your pharmacy.

## 2023-02-13 NOTE — Telephone Encounter (Signed)
3rd attempt, Patient called, left VM to return the call to the office to discuss symptoms with a nurse.  Summary: frequent urinating    Pt states that he was prescribed medication to help him stop going to the bathroom so much (urinating) due to his prostate cancer. He did not pick the medication up and realize now that he does need the medication and he forgot the name of the medication but is wanting to see if his PCP can send it into the pharmacy.  Please advise.

## 2023-02-13 NOTE — Telephone Encounter (Signed)
Patient called, left VM to return the call to the office to discuss symptoms with a nurse.  Summary: frequent urinating   Pt states that he was prescribed medication to help him stop going to the bathroom so much (urinating) due to his prostate cancer. He did not pick the medication up and realize now that he does need the medication and he forgot the name of the medication but is wanting to see if his PCP can send it into the pharmacy.  Please advise.

## 2023-02-13 NOTE — Telephone Encounter (Signed)
2nd attempt, Patient called, left VM to return the call to the office to discuss symptoms with a nurse.  

## 2023-02-14 ENCOUNTER — Encounter: Payer: Self-pay | Admitting: Family Medicine

## 2023-02-14 ENCOUNTER — Other Ambulatory Visit: Payer: Self-pay

## 2023-02-14 ENCOUNTER — Ambulatory Visit (INDEPENDENT_AMBULATORY_CARE_PROVIDER_SITE_OTHER): Payer: Medicaid Other | Admitting: Family Medicine

## 2023-02-14 DIAGNOSIS — I10 Essential (primary) hypertension: Secondary | ICD-10-CM | POA: Diagnosis not present

## 2023-02-14 MED ORDER — AMLODIPINE BESYLATE 10 MG PO TABS
10.0000 mg | ORAL_TABLET | Freq: Every day | ORAL | 0 refills | Status: DC
  Filled 2023-02-14 – 2023-02-21 (×2): qty 90, 90d supply, fill #0

## 2023-02-14 NOTE — Progress Notes (Unsigned)
C/o cough that has been persistent for awhile. Patient has taken OTC medication - Refill request -paperwork for shelter

## 2023-02-19 ENCOUNTER — Encounter: Payer: Self-pay | Admitting: Family Medicine

## 2023-02-19 NOTE — Progress Notes (Signed)
Established Patient Office Visit  Subjective    Patient ID: Randy Hansen, male    DOB: 06/01/1961  Age: 62 y.o. MRN: [id number]  CC: No chief complaint on file.   HPI DEMANTE BREINING presents for follow up of hypertension. Patient denies acute complaints.    Outpatient Encounter Medications as of 02/14/2023  Medication Sig   amoxicillin-clavulanate (AUGMENTIN) 875-125 MG tablet Take 1 tablet by mouth 2 (two) times daily.   cyclobenzaprine (FLEXERIL) 10 MG tablet Take 1 tablet (10 mg total) by mouth 2 (two) times daily as needed for muscle spasms.   Salicylic Acid 17 % KIT Apply topically.   salicylic acid-lactic acid 17 % external solution Apply topically daily.   sildenafil (VIAGRA) 100 MG tablet Take 1 tablet (100 mg total) by mouth daily as needed for Erectile Dysfunction.   tadalafil (CIALIS) 20 MG tablet TAKE 1 TABLET BY MOUTH 30 MINUTES TO 1 HOUR PRIOR TO INTERCOURSE AS NEEDED. LIMIT TO 1/2 TABLET OR 1 TABLET PER 24 HOURS   tiZANidine (ZANAFLEX) 4 MG tablet Take 1 tablet (4 mg total) by mouth 3 (three) times daily.   triamcinolone cream (KENALOG) 0.1 % Apply 1 application topically 2 (two) times daily.   [DISCONTINUED] amLODipine (NORVASC) 10 MG tablet Take 1 tablet (10 mg total) by mouth daily.   amLODipine (NORVASC) 10 MG tablet Take 1 tablet (10 mg total) by mouth daily.   fluticasone (FLONASE) 50 MCG/ACT nasal spray PLACE 2 SPRAYS INTO BOTH NOSTRILS DAILY.   fluticasone (FLONASE) 50 MCG/ACT nasal spray Place into the nose.   prazosin (MINIPRESS) 1 MG capsule TAKE 1 CAPSULE (1 MG TOTAL) BY MOUTH AT BEDTIME.   QUEtiapine (SEROQUEL) 100 MG tablet TAKE 1 TABLET (100 MG TOTAL) BY MOUTH AT BEDTIME.   [DISCONTINUED] metFORMIN (GLUCOPHAGE) 500 MG tablet Take 1 tablet (500 mg total) by mouth 2 (two) times daily with a meal. (Patient not taking: Reported on 04/19/2019)   No facility-administered encounter medications on file as of 02/14/2023.    Past Medical History:  Diagnosis  Date   Adrenal adenoma, right 2018   Cancer [facility])    [employer] 04/20/2020   Essential hypertension    dx by pcp 02-22-2019 per note in epic was given medication    (02-03-2020  asked pt about medication for bp and stated he stopped taking long time age , he does not have any problems now,  his bp wsa high "only because I was incarcerated I could not exercise and gained weight")   Hyperlipidemia    (02-03-2020  pt denies )   Hyperplasia of prostate with lower urinary tract symptoms (LUTS)    Lipoma    left hip   Pre-diabetes    dx 02-22-2019 by pcp note in epic was given metformin;  per next note 07/ 2020 pt had stopped taking metformin   (02-03-2020  pt denies having pre-diabetes and denied ever taking medication)   Prostate cancer [facility]) urologist--- dr winter/  oncologsit--- dr manning   dx [phone]  Stage T1c,  Gleason 3+4    Past Surgical History:  Procedure Laterality Date   GOLD SEED IMPLANT N/A 02/11/2020   Procedure: GOLD SEED IMPLANT;  Surgeon: Rene Paci, MD;  Location: [facility];  Service: Urology;  Laterality: N/A;   NO PAST SURGERIES     PROSTATE BIOPSY     SPACE OAR INSTILLATION N/A 02/11/2020   Procedure: SPACE OAR INSTILLATION;  Surgeon: Rene Paci, MD;  Location: [facility]  ;  Service: Urology;  Laterality: N/A;   TRANSRECTAL ULTRASOUND N/A 02/11/2020   Procedure: TRANSRECTAL ULTRASOUND;  Surgeon: Rene Paci, MD;  Location[facility];  Service: Urology;  Laterality: N/A;  ONLY NEED 30 MIN FOR ALL PROCEDURES    Family History  Problem Relation Age of Onset   Hypertension Mother    Breast cancer Mother    Hypertension Father    Hypertension Maternal Grandmother    Prostate cancer Neg Hx    Colon cancer Neg Hx    Pancreatic cancer Neg Hx     Social History   Socioeconomic History   Marital status: Single    Spouse name: Not on file   Number of children: 1   Years of  education: Not on file   Highest education level: Not on file  Occupational History   Not on file  Tobacco Use   Smoking status: Some Days    Packs/day: 0.50    Years: 40.00    Additional pack years: 0.00    Total pack years: 20.00    Types: Cigarettes   Smokeless tobacco: Never   Tobacco comments:    02-03-2020 per pt 1/2 pp7d  Vaping Use   Vaping Use: Never used  Substance and Sexual Activity   Alcohol use: Yes    Alcohol/week: 7.0 - 14.0 standard drinks of alcohol    Types: 7 - 14 Cans of beer per week    Comment: 1-2 beer daily   Drug use: Never   Sexual activity: Yes  Other Topics Concern   Not on file  Social History Narrative   Not on file   Social Determinants of Health   Financial Resource Strain: Not on file  Food Insecurity: Not on file  Transportation Needs: Not on file  Physical Activity: Not on file  Stress: Not on file  Social Connections: Not on file  Intimate Partner Violence: Not on file    Review of Systems  All other systems reviewed and are negative.       Objective    BP (!) 158/115   Pulse 79   Temp 98.1 F (36.7 C) (Oral)   Resp 16   Ht 5\' 11"  (1.803 m)   Wt 256 lb (116.1 kg)   SpO2 98%   BMI 35.70 kg/m   Physical Exam Vitals and nursing note reviewed.  Constitutional:      General: He is not in acute distress. Cardiovascular:     Rate and Rhythm: Normal rate and regular rhythm.  Pulmonary:     Effort: Pulmonary effort is normal.     Breath sounds: Normal breath sounds.  Abdominal:     Palpations: Abdomen is soft.     Tenderness: There is no abdominal tenderness.  Neurological:     General: No focal deficit present.     Mental Status: He is alert and oriented to person, place, and time.         Assessment & Plan:   1. Uncontrolled hypertension Elevated reading. Discussed compliance. Meds refilled.  - amLODipine (NORVASC) 10 MG tablet; Take 1 tablet (10 mg total) by mouth daily.  Dispense: 90 tablet; Refill:  0    Return in about 1 week (around 02/21/2023) for follow up.   Tommie Raymond, MD

## 2023-02-20 ENCOUNTER — Other Ambulatory Visit: Payer: Self-pay | Admitting: Family Medicine

## 2023-02-20 ENCOUNTER — Other Ambulatory Visit (HOSPITAL_COMMUNITY): Payer: Self-pay | Admitting: Family Medicine

## 2023-02-20 ENCOUNTER — Other Ambulatory Visit: Payer: Self-pay

## 2023-02-20 DIAGNOSIS — J0110 Acute frontal sinusitis, unspecified: Secondary | ICD-10-CM

## 2023-02-21 ENCOUNTER — Other Ambulatory Visit: Payer: Self-pay

## 2023-02-21 MED ORDER — SILDENAFIL CITRATE 100 MG PO TABS
ORAL_TABLET | ORAL | 0 refills | Status: DC
  Filled 2023-02-21: qty 10, 30d supply, fill #0

## 2023-02-21 MED ORDER — AMOXICILLIN-POT CLAVULANATE 875-125 MG PO TABS
1.0000 | ORAL_TABLET | Freq: Two times a day (BID) | ORAL | 0 refills | Status: AC
  Filled 2023-02-21: qty 20, 10d supply, fill #0

## 2023-02-21 MED ORDER — QUETIAPINE FUMARATE 100 MG PO TABS
ORAL_TABLET | Freq: Every day | ORAL | 2 refills | Status: AC
  Filled 2023-02-21: qty 30, 30d supply, fill #0
  Filled 2023-06-06: qty 30, 30d supply, fill #1

## 2023-04-22 ENCOUNTER — Other Ambulatory Visit: Payer: Self-pay

## 2023-04-22 ENCOUNTER — Other Ambulatory Visit: Payer: Self-pay | Admitting: Family Medicine

## 2023-04-22 DIAGNOSIS — J0110 Acute frontal sinusitis, unspecified: Secondary | ICD-10-CM

## 2023-04-24 ENCOUNTER — Other Ambulatory Visit: Payer: Self-pay

## 2023-04-24 MED ORDER — SILDENAFIL CITRATE 100 MG PO TABS
100.0000 mg | ORAL_TABLET | Freq: Every day | ORAL | 0 refills | Status: AC | PRN
  Filled 2023-04-24: qty 10, 30d supply, fill #0
  Filled 2023-05-07 – 2023-06-06 (×2): qty 10, 10d supply, fill #0

## 2023-04-29 ENCOUNTER — Other Ambulatory Visit: Payer: Self-pay

## 2023-05-01 ENCOUNTER — Other Ambulatory Visit: Payer: Self-pay

## 2023-05-07 ENCOUNTER — Other Ambulatory Visit: Payer: Self-pay | Admitting: Family Medicine

## 2023-05-07 ENCOUNTER — Other Ambulatory Visit: Payer: Self-pay

## 2023-05-07 ENCOUNTER — Other Ambulatory Visit (HOSPITAL_COMMUNITY): Payer: Self-pay

## 2023-05-07 DIAGNOSIS — I10 Essential (primary) hypertension: Secondary | ICD-10-CM

## 2023-05-07 MED ORDER — AMLODIPINE BESYLATE 10 MG PO TABS
10.0000 mg | ORAL_TABLET | Freq: Every day | ORAL | 0 refills | Status: AC
Start: 2023-05-07 — End: ?
  Filled 2023-05-07 – 2023-06-06 (×2): qty 90, 90d supply, fill #0

## 2023-05-19 ENCOUNTER — Other Ambulatory Visit (HOSPITAL_COMMUNITY): Payer: Self-pay

## 2023-05-22 ENCOUNTER — Telehealth: Payer: Self-pay | Admitting: Family Medicine

## 2023-05-22 NOTE — Telephone Encounter (Signed)
Pt is calling in because he lost his insurance card and needs it for his appointment in [place] today. Pt is requesting a copy of his insurance card be sent to him via MyChart because per pt if he doesn't have the card he can't attend his appointment. Pt is requesting someone give him a call back regarding this matter.

## 2023-06-06 ENCOUNTER — Other Ambulatory Visit (HOSPITAL_COMMUNITY): Payer: Self-pay

## 2023-06-06 ENCOUNTER — Other Ambulatory Visit: Payer: Self-pay

## 2023-06-06 NOTE — Progress Notes (Signed)
Randy Hansen 02-15-1961 [id number]  Patient outreached by Sofie Rower , PharmD on 06/06/2023.  Blood Pressure Readings: Does the patient have a validated home blood pressure machine?: No. He does not have a BP monitor at home but is interested in having one sent to him. States he is insured with Medicaid.   Medication review was performed. Is the patient taking their medications as prescribed?: Yes   The following barriers to adherence were noted: Does the patient have cost concerns?: Yes Does the patient have transportation concerns?: No Does the patient need assistance obtaining refills?: No Does the patient occassionally forget to take some of their prescribed medications?: No Does the patient feel like one/some of their medications make them feel poorly?: No Does the patient have questions or concerns about their medications?: No Does the patient have a follow up scheduled with their primary care provider/cardiologist?: Yes   Interventions: Interventions Completed: Medications were reviewed, Patient was educated on goal blood pressures and long term health implications of elevated blood pressure, Patient was educated on proper technique to check home blood pressure and reminded to bring home machine and readings to next provider appointment, Patient was educated on how to access home blood pressure machine, Patient was counseled on lifestyle modifications to improve blood pressure, including 150 minutes/week of moderate exercise and DASH diet.   Patient recently moved to [place], [place] ([address] [address]). He started with new PCP [name] at [facility]. He has a scheduled follow up visit with new PCP, but is interested in He occasionally comes up to [place]. He expressed interest in a doctor's note to clear him for plasma donation at [facility] in [place]. I told him this may be difficult because he does not have a f/u appointment with Dr. Andrey Campanile. He may  contact the office to speak about the note directly.   The patient has follow up scheduled:  PCP: [name], MD   [name], [facility]
